# Patient Record
Sex: Male | Born: 1945
Health system: Southern US, Community
[De-identification: ages and names within clinical notes are randomized; demographics above are authoritative.]

## PROBLEM LIST (undated history)

## (undated) DIAGNOSIS — I2510 Atherosclerotic heart disease of native coronary artery without angina pectoris: Secondary | ICD-10-CM

## (undated) DIAGNOSIS — I10 Essential (primary) hypertension: Secondary | ICD-10-CM

## (undated) DIAGNOSIS — G629 Polyneuropathy, unspecified: Secondary | ICD-10-CM

## (undated) DIAGNOSIS — K219 Gastro-esophageal reflux disease without esophagitis: Secondary | ICD-10-CM

## (undated) DIAGNOSIS — N400 Enlarged prostate without lower urinary tract symptoms: Secondary | ICD-10-CM

## (undated) DIAGNOSIS — N183 Chronic kidney disease, stage 3 unspecified: Secondary | ICD-10-CM

## (undated) DIAGNOSIS — J449 Chronic obstructive pulmonary disease, unspecified: Secondary | ICD-10-CM

## (undated) DIAGNOSIS — R339 Retention of urine, unspecified: Secondary | ICD-10-CM

## (undated) DIAGNOSIS — R252 Cramp and spasm: Secondary | ICD-10-CM

## (undated) DIAGNOSIS — J439 Emphysema, unspecified: Secondary | ICD-10-CM

## (undated) DIAGNOSIS — Z720 Tobacco use: Secondary | ICD-10-CM

## (undated) DIAGNOSIS — R972 Elevated prostate specific antigen [PSA]: Secondary | ICD-10-CM

## (undated) DIAGNOSIS — E785 Hyperlipidemia, unspecified: Secondary | ICD-10-CM

## (undated) DIAGNOSIS — R7989 Other specified abnormal findings of blood chemistry: Secondary | ICD-10-CM

## (undated) DIAGNOSIS — E538 Deficiency of other specified B group vitamins: Secondary | ICD-10-CM

## (undated) DIAGNOSIS — R3915 Urgency of urination: Secondary | ICD-10-CM

## (undated) DIAGNOSIS — R351 Nocturia: Secondary | ICD-10-CM

## (undated) DIAGNOSIS — Z8639 Personal history of other endocrine, nutritional and metabolic disease: Secondary | ICD-10-CM

## (undated) HISTORY — DX: Emphysema, unspecified: J43.9

## (undated) HISTORY — DX: Hyperlipidemia, unspecified: E78.5

## (undated) HISTORY — DX: Deficiency of other specified B group vitamins: E53.8

## (undated) HISTORY — DX: Atherosclerotic heart disease of native coronary artery without angina pectoris: I25.10

## (undated) HISTORY — DX: Nocturia: R35.1

## (undated) HISTORY — DX: Other specified abnormal findings of blood chemistry: R79.89

## (undated) HISTORY — DX: Chronic kidney disease, stage 3 unspecified: N18.30

## (undated) HISTORY — DX: Essential (primary) hypertension: I10

## (undated) HISTORY — DX: Gastro-esophageal reflux disease without esophagitis: K21.9

## (undated) HISTORY — DX: Cramp and spasm: R25.2

## (undated) HISTORY — DX: Urgency of urination: R39.15

## (undated) HISTORY — DX: Chronic obstructive pulmonary disease, unspecified: J44.9

## (undated) HISTORY — DX: Chronic kidney disease, stage 3 (moderate): N18.3

## (undated) HISTORY — PX: OTHER SURGICAL HISTORY: SHX169

## (undated) HISTORY — DX: Elevated prostate specific antigen (PSA): R97.20

## (undated) HISTORY — DX: Personal history of other endocrine, nutritional and metabolic disease: Z86.39

## (undated) HISTORY — DX: Benign prostatic hyperplasia without lower urinary tract symptoms: N40.0

## (undated) HISTORY — DX: Retention of urine, unspecified: R33.9

## (undated) HISTORY — DX: Polyneuropathy, unspecified: G62.9

## (undated) HISTORY — DX: Tobacco use: Z72.0

---

## 2004-01-14 ENCOUNTER — Ambulatory Visit: Payer: Self-pay | Admitting: Internal Medicine

## 2007-07-15 ENCOUNTER — Ambulatory Visit: Payer: Self-pay | Admitting: Internal Medicine

## 2008-09-08 ENCOUNTER — Ambulatory Visit: Payer: Self-pay | Admitting: Internal Medicine

## 2008-10-02 ENCOUNTER — Ambulatory Visit: Payer: Self-pay | Admitting: General Surgery

## 2008-10-06 ENCOUNTER — Ambulatory Visit: Payer: Self-pay | Admitting: General Surgery

## 2008-10-08 ENCOUNTER — Ambulatory Visit: Payer: Self-pay | Admitting: Internal Medicine

## 2008-10-28 ENCOUNTER — Ambulatory Visit: Payer: Self-pay | Admitting: Family Medicine

## 2008-11-03 ENCOUNTER — Ambulatory Visit: Payer: Self-pay | Admitting: Internal Medicine

## 2008-11-08 ENCOUNTER — Ambulatory Visit: Payer: Self-pay | Admitting: Internal Medicine

## 2008-11-24 ENCOUNTER — Ambulatory Visit: Payer: Self-pay | Admitting: General Surgery

## 2008-12-09 ENCOUNTER — Ambulatory Visit: Payer: Self-pay | Admitting: Internal Medicine

## 2009-04-10 HISTORY — PX: COLONOSCOPY: SHX174

## 2009-04-23 LAB — HM COLONOSCOPY

## 2009-04-27 ENCOUNTER — Ambulatory Visit: Payer: Self-pay | Admitting: General Surgery

## 2012-06-10 DIAGNOSIS — E785 Hyperlipidemia, unspecified: Secondary | ICD-10-CM | POA: Diagnosis not present

## 2012-06-10 DIAGNOSIS — J449 Chronic obstructive pulmonary disease, unspecified: Secondary | ICD-10-CM | POA: Diagnosis not present

## 2012-06-10 DIAGNOSIS — F17200 Nicotine dependence, unspecified, uncomplicated: Secondary | ICD-10-CM | POA: Diagnosis not present

## 2012-06-10 DIAGNOSIS — E538 Deficiency of other specified B group vitamins: Secondary | ICD-10-CM | POA: Diagnosis not present

## 2012-10-14 DIAGNOSIS — I10 Essential (primary) hypertension: Secondary | ICD-10-CM | POA: Diagnosis not present

## 2012-10-14 DIAGNOSIS — J449 Chronic obstructive pulmonary disease, unspecified: Secondary | ICD-10-CM | POA: Diagnosis not present

## 2012-10-14 DIAGNOSIS — E785 Hyperlipidemia, unspecified: Secondary | ICD-10-CM | POA: Diagnosis not present

## 2012-10-14 DIAGNOSIS — K219 Gastro-esophageal reflux disease without esophagitis: Secondary | ICD-10-CM | POA: Diagnosis not present

## 2013-02-21 DIAGNOSIS — Z125 Encounter for screening for malignant neoplasm of prostate: Secondary | ICD-10-CM | POA: Diagnosis not present

## 2013-02-21 DIAGNOSIS — K219 Gastro-esophageal reflux disease without esophagitis: Secondary | ICD-10-CM | POA: Diagnosis not present

## 2013-02-21 DIAGNOSIS — Z23 Encounter for immunization: Secondary | ICD-10-CM | POA: Diagnosis not present

## 2013-02-21 DIAGNOSIS — G609 Hereditary and idiopathic neuropathy, unspecified: Secondary | ICD-10-CM | POA: Diagnosis not present

## 2013-02-21 DIAGNOSIS — E559 Vitamin D deficiency, unspecified: Secondary | ICD-10-CM | POA: Diagnosis not present

## 2013-02-21 DIAGNOSIS — E785 Hyperlipidemia, unspecified: Secondary | ICD-10-CM | POA: Diagnosis not present

## 2013-02-21 DIAGNOSIS — E538 Deficiency of other specified B group vitamins: Secondary | ICD-10-CM | POA: Diagnosis not present

## 2013-02-21 DIAGNOSIS — I10 Essential (primary) hypertension: Secondary | ICD-10-CM | POA: Diagnosis not present

## 2013-05-13 DIAGNOSIS — Z1331 Encounter for screening for depression: Secondary | ICD-10-CM | POA: Diagnosis not present

## 2013-05-13 DIAGNOSIS — Z125 Encounter for screening for malignant neoplasm of prostate: Secondary | ICD-10-CM | POA: Diagnosis not present

## 2013-05-13 DIAGNOSIS — Z0000 Encounter for general adult medical examination without abnormal findings: Secondary | ICD-10-CM | POA: Diagnosis not present

## 2013-06-23 DIAGNOSIS — E538 Deficiency of other specified B group vitamins: Secondary | ICD-10-CM | POA: Diagnosis not present

## 2013-06-23 DIAGNOSIS — K219 Gastro-esophageal reflux disease without esophagitis: Secondary | ICD-10-CM | POA: Diagnosis not present

## 2013-06-23 DIAGNOSIS — I10 Essential (primary) hypertension: Secondary | ICD-10-CM | POA: Diagnosis not present

## 2013-06-23 DIAGNOSIS — G609 Hereditary and idiopathic neuropathy, unspecified: Secondary | ICD-10-CM | POA: Diagnosis not present

## 2013-06-23 DIAGNOSIS — R972 Elevated prostate specific antigen [PSA]: Secondary | ICD-10-CM | POA: Diagnosis not present

## 2013-07-07 DIAGNOSIS — R351 Nocturia: Secondary | ICD-10-CM | POA: Diagnosis not present

## 2013-07-07 DIAGNOSIS — N400 Enlarged prostate without lower urinary tract symptoms: Secondary | ICD-10-CM | POA: Diagnosis not present

## 2013-07-28 DIAGNOSIS — N400 Enlarged prostate without lower urinary tract symptoms: Secondary | ICD-10-CM | POA: Diagnosis not present

## 2013-07-28 DIAGNOSIS — R972 Elevated prostate specific antigen [PSA]: Secondary | ICD-10-CM | POA: Diagnosis not present

## 2013-09-23 LAB — PSA: PSA: 2

## 2013-10-27 DIAGNOSIS — F17200 Nicotine dependence, unspecified, uncomplicated: Secondary | ICD-10-CM | POA: Diagnosis not present

## 2013-10-27 DIAGNOSIS — G63 Polyneuropathy in diseases classified elsewhere: Secondary | ICD-10-CM | POA: Diagnosis not present

## 2013-10-27 DIAGNOSIS — R7989 Other specified abnormal findings of blood chemistry: Secondary | ICD-10-CM | POA: Diagnosis not present

## 2013-10-27 DIAGNOSIS — E538 Deficiency of other specified B group vitamins: Secondary | ICD-10-CM | POA: Diagnosis not present

## 2013-10-27 DIAGNOSIS — E785 Hyperlipidemia, unspecified: Secondary | ICD-10-CM | POA: Diagnosis not present

## 2013-10-27 DIAGNOSIS — I10 Essential (primary) hypertension: Secondary | ICD-10-CM | POA: Diagnosis not present

## 2013-11-21 ENCOUNTER — Ambulatory Visit: Payer: Self-pay | Admitting: Family Medicine

## 2013-11-21 DIAGNOSIS — F17200 Nicotine dependence, unspecified, uncomplicated: Secondary | ICD-10-CM | POA: Diagnosis not present

## 2013-11-21 DIAGNOSIS — I2510 Atherosclerotic heart disease of native coronary artery without angina pectoris: Secondary | ICD-10-CM | POA: Diagnosis not present

## 2013-11-21 DIAGNOSIS — Z122 Encounter for screening for malignant neoplasm of respiratory organs: Secondary | ICD-10-CM | POA: Diagnosis not present

## 2014-02-02 DIAGNOSIS — R351 Nocturia: Secondary | ICD-10-CM | POA: Diagnosis not present

## 2014-02-02 DIAGNOSIS — R972 Elevated prostate specific antigen [PSA]: Secondary | ICD-10-CM | POA: Diagnosis not present

## 2014-02-02 DIAGNOSIS — R339 Retention of urine, unspecified: Secondary | ICD-10-CM | POA: Diagnosis not present

## 2014-02-02 DIAGNOSIS — R3915 Urgency of urination: Secondary | ICD-10-CM | POA: Diagnosis not present

## 2014-02-02 DIAGNOSIS — N400 Enlarged prostate without lower urinary tract symptoms: Secondary | ICD-10-CM | POA: Diagnosis not present

## 2014-02-02 DIAGNOSIS — Z720 Tobacco use: Secondary | ICD-10-CM | POA: Diagnosis not present

## 2014-03-02 DIAGNOSIS — Z23 Encounter for immunization: Secondary | ICD-10-CM | POA: Diagnosis not present

## 2014-03-02 DIAGNOSIS — I2510 Atherosclerotic heart disease of native coronary artery without angina pectoris: Secondary | ICD-10-CM | POA: Diagnosis not present

## 2014-03-02 DIAGNOSIS — E785 Hyperlipidemia, unspecified: Secondary | ICD-10-CM | POA: Diagnosis not present

## 2014-03-02 DIAGNOSIS — K219 Gastro-esophageal reflux disease without esophagitis: Secondary | ICD-10-CM | POA: Diagnosis not present

## 2014-03-02 DIAGNOSIS — J449 Chronic obstructive pulmonary disease, unspecified: Secondary | ICD-10-CM | POA: Diagnosis not present

## 2014-03-02 DIAGNOSIS — I10 Essential (primary) hypertension: Secondary | ICD-10-CM | POA: Diagnosis not present

## 2014-03-02 DIAGNOSIS — G63 Polyneuropathy in diseases classified elsewhere: Secondary | ICD-10-CM | POA: Diagnosis not present

## 2014-03-02 DIAGNOSIS — E538 Deficiency of other specified B group vitamins: Secondary | ICD-10-CM | POA: Diagnosis not present

## 2014-03-02 LAB — LIPID PANEL
CHOLESTEROL: 98 mg/dL (ref 0–200)
HDL: 51 mg/dL (ref 35–70)
LDL Cholesterol: 36 mg/dL
Triglycerides: 53 mg/dL (ref 40–160)

## 2014-06-30 DIAGNOSIS — Z125 Encounter for screening for malignant neoplasm of prostate: Secondary | ICD-10-CM | POA: Diagnosis not present

## 2014-06-30 DIAGNOSIS — G63 Polyneuropathy in diseases classified elsewhere: Secondary | ICD-10-CM | POA: Diagnosis not present

## 2014-06-30 DIAGNOSIS — F17210 Nicotine dependence, cigarettes, uncomplicated: Secondary | ICD-10-CM | POA: Diagnosis not present

## 2014-06-30 DIAGNOSIS — K219 Gastro-esophageal reflux disease without esophagitis: Secondary | ICD-10-CM | POA: Diagnosis not present

## 2014-06-30 DIAGNOSIS — J449 Chronic obstructive pulmonary disease, unspecified: Secondary | ICD-10-CM | POA: Diagnosis not present

## 2014-06-30 DIAGNOSIS — I10 Essential (primary) hypertension: Secondary | ICD-10-CM | POA: Diagnosis not present

## 2014-06-30 DIAGNOSIS — Z23 Encounter for immunization: Secondary | ICD-10-CM | POA: Diagnosis not present

## 2014-06-30 DIAGNOSIS — E785 Hyperlipidemia, unspecified: Secondary | ICD-10-CM | POA: Diagnosis not present

## 2014-06-30 DIAGNOSIS — I2510 Atherosclerotic heart disease of native coronary artery without angina pectoris: Secondary | ICD-10-CM | POA: Diagnosis not present

## 2014-06-30 DIAGNOSIS — E538 Deficiency of other specified B group vitamins: Secondary | ICD-10-CM | POA: Diagnosis not present

## 2014-06-30 DIAGNOSIS — N183 Chronic kidney disease, stage 3 (moderate): Secondary | ICD-10-CM | POA: Diagnosis not present

## 2014-10-04 ENCOUNTER — Encounter: Payer: Self-pay | Admitting: Family Medicine

## 2014-10-04 DIAGNOSIS — Z719 Counseling, unspecified: Secondary | ICD-10-CM | POA: Insufficient documentation

## 2014-10-04 DIAGNOSIS — N183 Chronic kidney disease, stage 3 unspecified: Secondary | ICD-10-CM | POA: Insufficient documentation

## 2014-10-04 DIAGNOSIS — K219 Gastro-esophageal reflux disease without esophagitis: Secondary | ICD-10-CM | POA: Insufficient documentation

## 2014-10-04 DIAGNOSIS — G63 Polyneuropathy in diseases classified elsewhere: Secondary | ICD-10-CM

## 2014-10-04 DIAGNOSIS — E559 Vitamin D deficiency, unspecified: Secondary | ICD-10-CM | POA: Insufficient documentation

## 2014-10-04 DIAGNOSIS — E785 Hyperlipidemia, unspecified: Secondary | ICD-10-CM | POA: Insufficient documentation

## 2014-10-04 DIAGNOSIS — N400 Enlarged prostate without lower urinary tract symptoms: Secondary | ICD-10-CM | POA: Insufficient documentation

## 2014-10-04 DIAGNOSIS — I10 Essential (primary) hypertension: Secondary | ICD-10-CM | POA: Insufficient documentation

## 2014-10-04 DIAGNOSIS — E538 Deficiency of other specified B group vitamins: Secondary | ICD-10-CM | POA: Insufficient documentation

## 2014-10-04 DIAGNOSIS — J449 Chronic obstructive pulmonary disease, unspecified: Secondary | ICD-10-CM | POA: Insufficient documentation

## 2014-10-04 DIAGNOSIS — I2510 Atherosclerotic heart disease of native coronary artery without angina pectoris: Secondary | ICD-10-CM | POA: Insufficient documentation

## 2014-10-05 ENCOUNTER — Encounter: Payer: Self-pay | Admitting: Family Medicine

## 2014-10-05 ENCOUNTER — Ambulatory Visit (INDEPENDENT_AMBULATORY_CARE_PROVIDER_SITE_OTHER): Payer: Medicare Other | Admitting: Family Medicine

## 2014-10-05 VITALS — BP 130/74 | HR 70 | Temp 97.4°F | Resp 16 | Ht 71.0 in | Wt 158.7 lb

## 2014-10-05 DIAGNOSIS — Z0000 Encounter for general adult medical examination without abnormal findings: Secondary | ICD-10-CM

## 2014-10-05 DIAGNOSIS — Z719 Counseling, unspecified: Secondary | ICD-10-CM

## 2014-10-05 DIAGNOSIS — Z720 Tobacco use: Secondary | ICD-10-CM | POA: Insufficient documentation

## 2014-10-05 DIAGNOSIS — N400 Enlarged prostate without lower urinary tract symptoms: Secondary | ICD-10-CM

## 2014-10-05 NOTE — Progress Notes (Signed)
Name: Kevin Roberts   MRN: [id number]    DOB: September 19, 1945   Date:10/05/2014       Progress Note  Subjective  Chief Complaint  Chief Complaint  Patient presents with  . Annual Exam    HPI  Male Exam: he is feeling well, no complaints.  He is willing to try quitting smoking and will try resuming nicotine patch, since it has worked well for him in the past.  PSA is up to date, only has nocturia once per night - usually early in am. Sees Urologist once yearly for BPH.   Neuropathy: he is taking gabapentin twice daily only but seems to be controlling his symptoms. He forgets to take it at noon.   Cyst on suprapubic area: he noticed a bump on suprapubic area about three months ago, tender to touch and growing slowly.    Patient Active Problem List   Diagnosis Date Noted  . Tobacco use   . Smokes one pack a day or less   . B12 neuropathy 10/04/2014  . CAD, multiple vessel 10/04/2014  . Benign essential HTN 10/04/2014  . Chronic kidney disease (CKD), stage III (moderate) 10/04/2014  . Cigarette smoker 10/04/2014  . Dyslipidemia 10/04/2014  . COPD, mild 10/04/2014  . Gastro-esophageal reflux disease without esophagitis 10/04/2014  . Enlarged prostate 10/04/2014  . Vitamin D deficiency 10/04/2014    History reviewed. No pertinent past surgical history.  Family History  Problem Relation Age of Onset  . Hypertension Mother   . Cancer Father     Lung  . Mental illness Brother     1-Schizophrenia  . GER disease Son     History   Social History  . Marital Status: Married    Spouse Name: N/A  . Number of Children: N/A  . Years of Education: N/A   Occupational History  . Not on file.   Social History Main Topics  . Smoking status: Current Every Day Smoker -- 1.00 packs/day for 41 years    Types: Cigarettes    Start date: 10/04/1973  . Smokeless tobacco: Never Used  . Alcohol Use: No  . Drug Use: No  . Sexual Activity: No   Other Topics Concern  . Not on file    Social History Narrative  . No narrative on file     Current outpatient prescriptions:  .  aspirin 81 MG tablet, Take 1 tablet by mouth daily., Disp: , Rfl:  .  atenolol (TENORMIN) 25 MG tablet, Take 1 tablet by mouth daily., Disp: , Rfl:  .  atorvastatin (LIPITOR) 40 MG tablet, Take 1 tablet by mouth daily., Disp: , Rfl:  .  Cyanocobalamin (B-12) 1000 MCG TBCR, Take 1 tablet by mouth daily., Disp: , Rfl:  .  gabapentin (NEURONTIN) 300 MG capsule, Take 1 capsule by mouth daily., Disp: , Rfl:  .  lansoprazole (PREVACID) 30 MG capsule, Take 1 capsule by mouth daily., Disp: , Rfl:  .  MULTIPLE VITAMIN PO, Take 1 tablet by mouth daily., Disp: , Rfl:  .  potassium chloride SA (KLOR-CON M20) 20 MEQ tablet, Take 1 tablet by mouth daily., Disp: , Rfl:  .  SPIRIVA HANDIHALER 18 MCG inhalation capsule, Place 1 puff into inhaler and inhale daily., Disp: , Rfl:  .  tamsulosin (FLOMAX) 0.4 MG CAPS capsule, Take 1 capsule by mouth daily., Disp: , Rfl:  .  triamterene-hydrochlorothiazide (MAXZIDE-25) 37.5-25 MG per tablet, Take 1 tablet by mouth daily., Disp: , Rfl:  .  Vitamin  D, Cholecalciferol, 1000 UNITS CAPS, Take 1 tablet by mouth daily., Disp: , Rfl:   No Known Allergies   ROS  Constitutional: Negative for fever or weight change.  Respiratory: Negative for cough and shortness of breath.   Cardiovascular: Negative for chest pain or palpitations.  Gastrointestinal: Negative for abdominal pain, no bowel changes.  Musculoskeletal: Negative for gait problem or joint swelling.  Skin: Negative for rash.  Neurological: Negative for dizziness or headache.  No other specific complaints in a complete review of systems (except as listed in HPI above).   Objective  Filed Vitals:   10/05/14 0837  BP: 130/74  Pulse: 70  Temp: 97.4 F (36.3 C)  TempSrc: Oral  Resp: 16  Height: 5\' 11"  (1.803 m)  Weight: 158 lb 11.2 oz (71.986 kg)  SpO2: 93%    Body mass index is 22.14  kg/(m^2).  Physical Exam  Constitutional: Patient appears well-developed and well-nourished. No distress.  HENT: Head: Normocephalic and atraumatic.  Nose: Nose normal. Mouth/Throat: Oropharynx is clear and moist. No oropharyngeal exudate.  Eyes: Conjunctivae and EOM are normal. Pupils are equal, round, and reactive to light. No scleral icterus. Cerumen impaction on both ears Neck: Normal range of motion. Neck supple. No JVD present. No thyromegaly present.  Cardiovascular: Normal rate, regular rhythm and normal heart sounds.  No murmur heard. No BLE edema. Pulmonary/Chest: Effort normal and breath sounds normal. No respiratory distress. Abdominal: Soft. Bowel sounds are normal, no distension. There is no tenderness. no masses MALE GENITALIA: Normal descended testes bilaterally, no masses palpated, no hernias, no lesions, no discharge RECTAL: Prostate mildly enlarged and consistency, no rectal masses or hemorrhoids Musculoskeletal: Normal range of motion, no joint effusions. No gross deformities Neurological: he is alert and oriented to person, place, and time. No cranial nerve deficit. Coordination, balance, strength, speech and gait are normal.  Skin: Skin is warm and dry. No rash noted. No erythema. He has a tender cyst on suprapubic area Psychiatric: Patient has a normal mood and affect. behavior is normal. Judgment and thought content normal.    PHQ2/9: Depression screen PHQ 2/9 10/05/2014  Decreased Interest 0  Down, Depressed, Hopeless 0  PHQ - 2 Score 0     Fall Risk: Fall Risk  10/05/2014  Falls in the past year? No    Assessment & Plan  1. Encounter for routine history and physical exam for male  - US Aorta; Future  2. Encounter for counseling Discussed importance of 150 minutes of physical activity weekly, eat two servings of fish weekly, eat one serving of tree nuts ( cashews, pistachios, pecans, almonds.Marland Kitchen) every other day, eat 6 servings of fruit/vegetables daily  and drink plenty of water and avoid sweet beverages. Continue baby aspirin daily and quit smoking  3. Enlarged prostate PSA within normal limits continue follow up with Urologist

## 2014-10-05 NOTE — Patient Instructions (Signed)
  Mr. Melander , Thank you for taking time to come for your Medicare Wellness Visit. I appreciate your ongoing commitment to your health goals. Please review the following plan we discussed and let me know if I can assist you in the future.   These are the goals we discussed: He needs to quit smoking, he has tried otc Nicotine gym, he has tried nicotine patch and did well, he will try that again otc.   Discussed importance of 150 minutes of physical activity weekly, eat two servings of fish weekly, eat one serving of tree nuts ( cashews, pistachios, pecans, almonds.Marland Kitchen) every other day, eat 6 servings of fruit/vegetables daily and drink plenty of water and avoid sweet beverages.    This is a list of the screening recommended for you and due dates:  Health Maintenance  Topic Date Due  . Flu Shot  11/09/2014  . Pneumonia vaccines (2 of 2 - PPSV23) 06/30/2015  . Tetanus Vaccine  12/08/2018  . Colon Cancer Screening  04/24/2019  . Shingles Vaccine  Completed

## 2014-10-07 ENCOUNTER — Telehealth: Payer: Self-pay | Admitting: Radiology

## 2014-10-07 ENCOUNTER — Ambulatory Visit
Admission: RE | Admit: 2014-10-07 | Discharge: 2014-10-07 | Disposition: A | Discharge status: Home / Self Care | Payer: Medicare Other | Source: Ambulatory Visit | Attending: Family Medicine | Admitting: Family Medicine

## 2014-10-07 ENCOUNTER — Other Ambulatory Visit: Payer: Self-pay | Admitting: Family Medicine

## 2014-10-07 DIAGNOSIS — Z8249 Family history of ischemic heart disease and other diseases of the circulatory system: Secondary | ICD-10-CM

## 2014-10-07 DIAGNOSIS — R109 Unspecified abdominal pain: Secondary | ICD-10-CM

## 2014-10-07 DIAGNOSIS — Z720 Tobacco use: Secondary | ICD-10-CM

## 2014-10-07 DIAGNOSIS — F17200 Nicotine dependence, unspecified, uncomplicated: Secondary | ICD-10-CM

## 2014-10-07 DIAGNOSIS — Z0000 Encounter for general adult medical examination without abnormal findings: Secondary | ICD-10-CM

## 2014-10-07 DIAGNOSIS — R1900 Intra-abdominal and pelvic swelling, mass and lump, unspecified site: Secondary | ICD-10-CM

## 2014-10-07 DIAGNOSIS — R0989 Other specified symptoms and signs involving the circulatory and respiratory systems: Secondary | ICD-10-CM

## 2014-10-13 ENCOUNTER — Ambulatory Visit
Admission: RE | Admit: 2014-10-13 | Discharge: 2014-10-13 | Disposition: A | Discharge status: Home / Self Care | Payer: Medicare Other | Source: Ambulatory Visit | Attending: Family Medicine | Admitting: Family Medicine

## 2014-10-13 DIAGNOSIS — N281 Cyst of kidney, acquired: Secondary | ICD-10-CM | POA: Insufficient documentation

## 2014-10-13 DIAGNOSIS — F17200 Nicotine dependence, unspecified, uncomplicated: Secondary | ICD-10-CM | POA: Insufficient documentation

## 2014-10-13 DIAGNOSIS — I700 Atherosclerosis of aorta: Secondary | ICD-10-CM | POA: Insufficient documentation

## 2014-10-13 DIAGNOSIS — R109 Unspecified abdominal pain: Secondary | ICD-10-CM

## 2014-10-13 DIAGNOSIS — Z8249 Family history of ischemic heart disease and other diseases of the circulatory system: Secondary | ICD-10-CM | POA: Insufficient documentation

## 2014-10-13 DIAGNOSIS — Z0000 Encounter for general adult medical examination without abnormal findings: Secondary | ICD-10-CM | POA: Insufficient documentation

## 2014-10-13 DIAGNOSIS — Z720 Tobacco use: Secondary | ICD-10-CM | POA: Diagnosis not present

## 2014-10-13 DIAGNOSIS — R0989 Other specified symptoms and signs involving the circulatory and respiratory systems: Secondary | ICD-10-CM

## 2014-10-14 ENCOUNTER — Other Ambulatory Visit: Payer: Medicare Other

## 2014-10-21 ENCOUNTER — Ambulatory Visit (INDEPENDENT_AMBULATORY_CARE_PROVIDER_SITE_OTHER): Payer: Medicare Other | Admitting: Family Medicine

## 2014-10-21 ENCOUNTER — Encounter: Payer: Self-pay | Admitting: Family Medicine

## 2014-10-21 VITALS — BP 128/76 | HR 72 | Temp 98.2°F | Resp 18 | Ht 71.0 in | Wt 157.4 lb

## 2014-10-21 DIAGNOSIS — L910 Hypertrophic scar: Secondary | ICD-10-CM

## 2014-10-21 DIAGNOSIS — L298 Other pruritus: Secondary | ICD-10-CM | POA: Diagnosis not present

## 2014-10-21 DIAGNOSIS — H9203 Otalgia, bilateral: Secondary | ICD-10-CM | POA: Diagnosis not present

## 2014-10-21 DIAGNOSIS — H6123 Impacted cerumen, bilateral: Secondary | ICD-10-CM

## 2014-10-21 DIAGNOSIS — L989 Disorder of the skin and subcutaneous tissue, unspecified: Secondary | ICD-10-CM | POA: Diagnosis not present

## 2014-10-21 MED ORDER — LIDOCAINE HCL (PF) 1 % IJ SOLN
2.0000 mL | Freq: Once | INTRAMUSCULAR | Status: AC
  Administered 2014-10-21: 2 mL via INTRADERMAL

## 2014-10-21 NOTE — Progress Notes (Signed)
Name: Kevin Roberts   MRN: [id number]    DOB: 1945-05-09   Date:10/21/2014       Progress Note  Subjective  Chief Complaint  Chief Complaint  Patient presents with  . Cyst    onset-4 months, has increased in size, wants removal, itchy near scrotum  . Ear Fullness    bilateral, left ear is worst, trouble hearing feels like it is full of wax    HPI  Skin lesion: on supra pubic area, going on for months and increasing in size, bothering him  Ear Fullness/pain and mild hearing loss, cerumen impaction on exam, symptoms improved after procedure  Patient Active Problem List   Diagnosis Date Noted  . Renal cyst, left 10/13/2014  . Tobacco use   . Smokes one pack a day or less   . B12 neuropathy 10/04/2014  . CAD, multiple vessel 10/04/2014  . Benign essential HTN 10/04/2014  . Chronic kidney disease (CKD), stage III (moderate) 10/04/2014  . Cigarette smoker 10/04/2014  . Dyslipidemia 10/04/2014  . COPD, mild 10/04/2014  . Gastro-esophageal reflux disease without esophagitis 10/04/2014  . Enlarged prostate 10/04/2014  . Vitamin D deficiency 10/04/2014    History  Substance Use Topics  . Smoking status: Current Every Day Smoker -- 1.00 packs/day for 41 years    Types: Cigarettes    Start date: 10/04/1973  . Smokeless tobacco: Never Used  . Alcohol Use: No     Current outpatient prescriptions:  .  aspirin 81 MG tablet, Take 1 tablet by mouth daily., Disp: , Rfl:  .  atenolol (TENORMIN) 25 MG tablet, Take 1 tablet by mouth daily., Disp: , Rfl:  .  atorvastatin (LIPITOR) 40 MG tablet, Take 1 tablet by mouth daily., Disp: , Rfl:  .  Cyanocobalamin (B-12) 1000 MCG TBCR, Take 1 tablet by mouth daily., Disp: , Rfl:  .  gabapentin (NEURONTIN) 300 MG capsule, Take 1 capsule by mouth daily., Disp: , Rfl:  .  lansoprazole (PREVACID) 30 MG capsule, Take 1 capsule by mouth daily., Disp: , Rfl:  .  MULTIPLE VITAMIN PO, Take 1 tablet by mouth daily., Disp: , Rfl:  .  potassium chloride  SA (KLOR-CON M20) 20 MEQ tablet, Take 1 tablet by mouth daily., Disp: , Rfl:  .  SPIRIVA HANDIHALER 18 MCG inhalation capsule, Place 1 puff into inhaler and inhale daily., Disp: , Rfl:  .  tamsulosin (FLOMAX) 0.4 MG CAPS capsule, Take 1 capsule by mouth daily., Disp: , Rfl:  .  triamterene-hydrochlorothiazide (MAXZIDE-25) 37.5-25 MG per tablet, Take 1 tablet by mouth daily., Disp: , Rfl:  .  Vitamin D, Cholecalciferol, 1000 UNITS CAPS, Take 1 tablet by mouth daily., Disp: , Rfl:   No Known Allergies  ROS  Ten systems reviewed and is negative except as mentioned in HPI   Objective  Filed Vitals:   10/21/14 1301  BP: 128/76  Pulse: 72  Temp: 98.2 F (36.8 C)  Resp: 18     Physical Exam Constitutional: Patient appears well-developed and well-nourished. No distress.  Eyes:  No scleral icterus.  Neck: Normal range of motion. Neck supple. Cardiovascular: Normal rate, regular rhythm and normal heart sounds.  No murmur heard. No BLE edema. Pulmonary/Chest: Effort normal and breath sounds normal. No respiratory distress. Abdominal: Soft.  There is no tenderness. Psychiatric: Patient has a normal mood and affect. behavior is normal. Judgment and thought content normal. Cerumen impaction bilaterally, removed with Ear lavage Skin lesion, 8 mm in size on supra pubic  area, non tender, thick and hyperpigmented skin   Assessment & Plan  1. Cerumen impaction, bilateral  - Ear Lavage Lavage done with peroxide and warm water, verbal consent given by patient. He tolerated procedure well, no complications, ear fullness resolved after procedure 2. Ear pain, bilateral Resolved with ear lavage - Ear Lavage  3. Skin lesion . Consent signed: YES  Procedure: Skin Mass Removal Location: supra pubic area Equipment used: derma-blade, high temperature cautery, sterile scalpel, tweezers, curved scissors Anesthesia: 1% Lidocaine w/o Epinephrine  Cleaned and prepped: Betadine  After consent  signed, are of skin prepped with betadine. Lidocaine w/o epinephrine injected into skin underneath skin mass. After properly numbed sterile equipment used to remove tag.  Specimen sent for pathology analysis. Instructed on proper care to allow for proper healing. F/U for nursing visit if needed. - PR DESTRUC BENIGN/PREMAL,FIRST LESION

## 2014-11-12 ENCOUNTER — Ambulatory Visit: Payer: Self-pay | Admitting: Family Medicine

## 2014-11-25 ENCOUNTER — Ambulatory Visit (INDEPENDENT_AMBULATORY_CARE_PROVIDER_SITE_OTHER): Payer: Medicare Other | Admitting: Family Medicine

## 2014-11-25 ENCOUNTER — Encounter: Payer: Self-pay | Admitting: Family Medicine

## 2014-11-25 VITALS — BP 130/70 | HR 108 | Temp 98.8°F | Resp 18 | Ht 71.0 in | Wt 159.3 lb

## 2014-11-25 DIAGNOSIS — Z23 Encounter for immunization: Secondary | ICD-10-CM

## 2014-11-25 DIAGNOSIS — J449 Chronic obstructive pulmonary disease, unspecified: Secondary | ICD-10-CM

## 2014-11-25 DIAGNOSIS — Z720 Tobacco use: Secondary | ICD-10-CM

## 2014-11-25 DIAGNOSIS — E785 Hyperlipidemia, unspecified: Secondary | ICD-10-CM

## 2014-11-25 DIAGNOSIS — E538 Deficiency of other specified B group vitamins: Secondary | ICD-10-CM | POA: Diagnosis not present

## 2014-11-25 DIAGNOSIS — I2510 Atherosclerotic heart disease of native coronary artery without angina pectoris: Secondary | ICD-10-CM

## 2014-11-25 DIAGNOSIS — N183 Chronic kidney disease, stage 3 unspecified: Secondary | ICD-10-CM

## 2014-11-25 DIAGNOSIS — G63 Polyneuropathy in diseases classified elsewhere: Secondary | ICD-10-CM

## 2014-11-25 DIAGNOSIS — I10 Essential (primary) hypertension: Secondary | ICD-10-CM

## 2014-11-25 DIAGNOSIS — K219 Gastro-esophageal reflux disease without esophagitis: Secondary | ICD-10-CM | POA: Diagnosis not present

## 2014-11-25 DIAGNOSIS — E559 Vitamin D deficiency, unspecified: Secondary | ICD-10-CM | POA: Diagnosis not present

## 2014-11-25 MED ORDER — GABAPENTIN 300 MG PO CAPS: 300.0000 mg | ORAL_CAPSULE | Freq: Three times a day (TID) | ORAL | Status: DC

## 2014-11-25 MED ORDER — ATENOLOL 25 MG PO TABS: 25.0000 mg | ORAL_TABLET | Freq: Every day | ORAL | Status: DC

## 2014-11-25 MED ORDER — LANSOPRAZOLE 30 MG PO CPDR: 30.0000 mg | DELAYED_RELEASE_CAPSULE | Freq: Every day | ORAL | Status: DC

## 2014-11-25 MED ORDER — POTASSIUM CHLORIDE CRYS ER 20 MEQ PO TBCR: 20.0000 meq | EXTENDED_RELEASE_TABLET | Freq: Every day | ORAL | Status: DC

## 2014-11-25 MED ORDER — ATORVASTATIN CALCIUM 40 MG PO TABS: 40.0000 mg | ORAL_TABLET | Freq: Every day | ORAL | Status: DC

## 2014-11-25 NOTE — Progress Notes (Signed)
Name: Kevin Roberts   MRN: [id number]    DOB: 1945-10-02   Date:11/25/2014       Progress Note  Subjective  Chief Complaint  Chief Complaint  Patient presents with  . COPD    Unchanged, Dry Cough, Itchy Throat  . Hypertension    No problems  . Gastrophageal Reflux    Unchanged, well controlled with medication  . Hyperlipidemia    Leg Cramps, Hand Cramps Occasionally  . Medication Refill    4 month F/U    HPI  COPD Mild: he is taking Spiriva, no change in sputum production or SOB even though he has a cold since yesterday with clear rhinorrhea, nasal congestion, no fever.    HTN: taking medication, denies side effects and bp is at goal  GERD: symptoms are under control as long as he takes Lanzoprazole, he states when he skips medications for one day he develops heartburn and epigastric pain and has regurgitation.  Hyperlipidemia: taking Atorvastatin and denies side effects. Taking aspirin daily   CAD: he denies chest pain , but he has decrease in exercise tolerance, it may be multifactorial from COPD also. He has never seen a cardiologist - at this point he only wants medical management  B12 neuropathy: he is taking B12 supplementation and also takes Neurontin  - symptoms are stable  Tobacco Abuse: he has been a smoker for 41 years, he is up to date with triple A screening, negative in 2016, chest CT done in 11/2013 and is due for repeat.  Explained importance of quitting smoking   Patient Active Problem List   Diagnosis Date Noted  . Renal cyst, left 10/13/2014  . Tobacco use   . B12 neuropathy 10/04/2014  . CAD, multiple vessel 10/04/2014  . Benign essential HTN 10/04/2014  . Chronic kidney disease (CKD), stage III (moderate) 10/04/2014  . Dyslipidemia 10/04/2014  . COPD, mild 10/04/2014  . Gastro-esophageal reflux disease without esophagitis 10/04/2014  . Enlarged prostate 10/04/2014  . Vitamin D deficiency 10/04/2014    History reviewed. No pertinent past surgical  history.  Family History  Problem Relation Age of Onset  . Hypertension Mother   . Cancer Father     Lung  . Mental illness Brother     1-Schizophrenia  . GER disease Son     Social History   Social History  . Marital Status: Married    Spouse Name: N/A  . Number of Children: N/A  . Years of Education: N/A   Occupational History  . Not on file.   Social History Main Topics  . Smoking status: Current Every Day Smoker -- 1.00 packs/day for 41 years    Types: Cigarettes    Start date: 10/04/1973  . Smokeless tobacco: Never Used  . Alcohol Use: No  . Drug Use: No  . Sexual Activity: No   Other Topics Concern  . Not on file   Social History Narrative     Current outpatient prescriptions:  .  aspirin 81 MG tablet, Take 1 tablet by mouth daily., Disp: , Rfl:  .  atenolol (TENORMIN) 25 MG tablet, Take 1 tablet (25 mg total) by mouth daily., Disp: 90 tablet, Rfl: 1 .  atorvastatin (LIPITOR) 40 MG tablet, Take 1 tablet (40 mg total) by mouth daily., Disp: 90 tablet, Rfl: 1 .  Cyanocobalamin (B-12) 1000 MCG TBCR, Take 1 tablet by mouth daily., Disp: , Rfl:  .  gabapentin (NEURONTIN) 300 MG capsule, Take 1 capsule (300 mg total)  by mouth 3 (three) times daily., Disp: 270 capsule, Rfl: 1 .  lansoprazole (PREVACID) 30 MG capsule, Take 1 capsule (30 mg total) by mouth daily., Disp: 90 capsule, Rfl: 1 .  MULTIPLE VITAMIN PO, Take 1 tablet by mouth daily., Disp: , Rfl:  .  potassium chloride SA (KLOR-CON M20) 20 MEQ tablet, Take 1 tablet (20 mEq total) by mouth daily., Disp: 90 tablet, Rfl: 1 .  SPIRIVA HANDIHALER 18 MCG inhalation capsule, Place 1 puff into inhaler and inhale daily., Disp: , Rfl:  .  tamsulosin (FLOMAX) 0.4 MG CAPS capsule, Take 1 capsule by mouth daily., Disp: , Rfl:  .  triamterene-hydrochlorothiazide (MAXZIDE-25) 37.5-25 MG per tablet, Take 1 tablet by mouth daily., Disp: , Rfl:  .  Vitamin D, Cholecalciferol, 1000 UNITS CAPS, Take 1 tablet by mouth daily.,  Disp: , Rfl:   No Known Allergies   ROS  Constitutional: Negative for fever or weight change.  Respiratory: Positive  for cough mild  shortness of breath with activity .   Cardiovascular: Negative for chest pain or palpitations.  Gastrointestinal: Negative for abdominal pain, no bowel changes.  Musculoskeletal: Negative for gait problem or joint swelling.  Skin: Negative for rash.  Neurological: Negative for dizziness or headache.  No other specific complaints in a complete review of systems (except as listed in HPI above).  Objective  Filed Vitals:   11/25/14 0942  BP: 130/70  Pulse: 108  Temp: 98.8 F (37.1 C)  TempSrc: Oral  Resp: 18  Height: 5\' 11"  (1.803 m)  Weight: 159 lb 4.8 oz (72.258 kg)  SpO2: 95%    Body mass index is 22.23 kg/(m^2).  Physical Exam Constitutional: Patient appears well-developed and well-nourished, thin No distress.  HEENT: head atraumatic, normocephalic, pupils equal and reactive to light, neck supple, throat within normal limits Cardiovascular: Normal rate, regular rhythm and normal heart sounds.  No murmur heard. No BLE edema. Pulmonary/Chest: Effort normal and breath sounds normal. No respiratory distress. Abdominal: Soft.  There is no tenderness. Psychiatric: Patient has a normal mood and affect. behavior is normal. Judgment and thought content normal.   PHQ2/9: Depression screen PHQ 2/9 10/05/2014  Decreased Interest 0  Down, Depressed, Hopeless 0  PHQ - 2 Score 0    Fall Risk: Fall Risk  10/05/2014  Falls in the past year? No     Assessment & Plan  1. COPD, mild Continue medication, advised to quit smoking  2. Tobacco use  - CT CHEST LUNG CA SCREEN LOW DOSE W/O CM; Future  3. Dyslipidemia  - Lipid panel - atorvastatin (LIPITOR) 40 MG tablet; Take 1 tablet (40 mg total) by mouth daily.  Dispense: 90 tablet; Refill: 1  4. Gastro-esophageal reflux disease without esophagitis  - lansoprazole (PREVACID) 30 MG capsule;  Take 1 capsule (30 mg total) by mouth daily.  Dispense: 90 capsule; Refill: 1  5. CAD, multiple vessel Continue medication management  6. Benign essential HTN At goal  - Comprehensive metabolic panel - potassium chloride SA (KLOR-CON M20) 20 MEQ tablet; Take 1 tablet (20 mEq total) by mouth daily.  Dispense: 90 tablet; Refill: 1 - atenolol (TENORMIN) 25 MG tablet; Take 1 tablet (25 mg total) by mouth daily.  Dispense: 90 tablet; Refill: 1  7. Chronic kidney disease (CKD), stage III (moderate) Recheck labs  8. B12 neuropathy  - Vitamin B12 - gabapentin (NEURONTIN) 300 MG capsule; Take 1 capsule (300 mg total) by mouth 3 (three) times daily.  Dispense: 270 capsule; Refill: 1  9. Vitamin D deficiency  - Vit D  25 hydroxy (rtn osteoporosis monitoring)  10. Needs flu shot  - Flu Vaccine QUAD 36+ mos IM

## 2014-12-03 DIAGNOSIS — E559 Vitamin D deficiency, unspecified: Secondary | ICD-10-CM | POA: Diagnosis not present

## 2014-12-03 DIAGNOSIS — E538 Deficiency of other specified B group vitamins: Secondary | ICD-10-CM | POA: Diagnosis not present

## 2014-12-03 DIAGNOSIS — E785 Hyperlipidemia, unspecified: Secondary | ICD-10-CM | POA: Diagnosis not present

## 2014-12-03 DIAGNOSIS — I10 Essential (primary) hypertension: Secondary | ICD-10-CM | POA: Diagnosis not present

## 2014-12-04 LAB — VITAMIN B12: Vitamin B-12: 1239 pg/mL — ABNORMAL HIGH (ref 211–946)

## 2014-12-04 LAB — COMPREHENSIVE METABOLIC PANEL
A/G RATIO: 1.6 (ref 1.1–2.5)
ALT: 21 IU/L (ref 0–44)
AST: 34 IU/L (ref 0–40)
Albumin: 4.4 g/dL (ref 3.6–4.8)
Alkaline Phosphatase: 65 IU/L (ref 39–117)
BILIRUBIN TOTAL: 1.2 mg/dL (ref 0.0–1.2)
BUN/Creatinine Ratio: 9 — ABNORMAL LOW (ref 10–22)
BUN: 12 mg/dL (ref 8–27)
CHLORIDE: 98 mmol/L (ref 97–108)
CO2: 25 mmol/L (ref 18–29)
Calcium: 10.4 mg/dL — ABNORMAL HIGH (ref 8.6–10.2)
Creatinine, Ser: 1.31 mg/dL — ABNORMAL HIGH (ref 0.76–1.27)
GFR calc Af Amer: 65 mL/min/{1.73_m2} (ref >59)
GFR calc non Af Amer: 56 mL/min/{1.73_m2} — ABNORMAL LOW (ref >59)
Globulin, Total: 2.8 g/dL (ref 1.5–4.5)
Glucose: 75 mg/dL (ref 65–99)
POTASSIUM: 4.4 mmol/L (ref 3.5–5.2)
Sodium: 139 mmol/L (ref 134–144)
TOTAL PROTEIN: 7.2 g/dL (ref 6.0–8.5)

## 2014-12-04 LAB — VITAMIN D 25 HYDROXY (VIT D DEFICIENCY, FRACTURES): Vit D, 25-Hydroxy: 57.3 ng/mL (ref 30.0–100.0)

## 2014-12-04 LAB — LIPID PANEL
CHOLESTEROL TOTAL: 92 mg/dL — AB (ref 100–199)
Chol/HDL Ratio: 1.9 ratio units (ref 0.0–5.0)
HDL: 49 mg/dL (ref >39)
LDL Calculated: 33 mg/dL (ref 0–99)
TRIGLYCERIDES: 48 mg/dL (ref 0–149)
VLDL CHOLESTEROL CAL: 10 mg/dL (ref 5–40)

## 2014-12-08 NOTE — Progress Notes (Signed)
Patient notified

## 2015-01-21 ENCOUNTER — Encounter: Payer: Self-pay | Admitting: *Deleted

## 2015-02-03 ENCOUNTER — Encounter: Payer: Self-pay | Admitting: Urology

## 2015-02-03 ENCOUNTER — Ambulatory Visit (INDEPENDENT_AMBULATORY_CARE_PROVIDER_SITE_OTHER): Payer: Medicare Other | Admitting: Urology

## 2015-02-03 VITALS — BP 120/56 | HR 47 | Ht 71.0 in | Wt 154.8 lb

## 2015-02-03 DIAGNOSIS — N400 Enlarged prostate without lower urinary tract symptoms: Secondary | ICD-10-CM | POA: Diagnosis not present

## 2015-02-03 DIAGNOSIS — N401 Enlarged prostate with lower urinary tract symptoms: Secondary | ICD-10-CM

## 2015-02-03 DIAGNOSIS — N138 Other obstructive and reflux uropathy: Secondary | ICD-10-CM

## 2015-02-03 DIAGNOSIS — I2510 Atherosclerotic heart disease of native coronary artery without angina pectoris: Secondary | ICD-10-CM | POA: Diagnosis not present

## 2015-02-03 NOTE — Progress Notes (Signed)
02/03/2015 1:45 PM   Kevin Roberts [birth year] [id number]  Referring provider: Steele Sizer, MD [address] [place], [place] [address]  Chief Complaint  Patient presents with  . Benign Prostatic Hypertrophy    one year recheck    HPI: Patient is a 69 year old African-American male who presents today for his yearly office visit for BPH with LUTS managed with tamsulosin.  BPH WITH LUTS His IPSS score today is 6, which is mild lower urinary tract symptomatology. He is mostly satisfied with his quality life due to his urinary symptoms.  His previous IPSS score was 3/2.  His previous PVR is 27 mL.   He denies any dysuria, hematuria or suprapubic pain.  He currently taking tamsulosin 0.4 mg daily.  He also denies any recent fevers, chills, nausea or vomiting.   He does not have a family history of PCa.      IPSS      02/03/15 1000       International Prostate Symptom Score   How often have you had the sensation of not emptying your bladder? Less than half the time     How often have you had to urinate less than every two hours? Not at All     How often have you found you stopped and started again several times when you urinated? Not at All     How often have you found it difficult to postpone urination? About half the time     How often have you had a weak urinary stream? Not at All     How often have you had to strain to start urination? Not at All     How many times did you typically get up at night to urinate? 1 Time     Total IPSS Score 6     Quality of Life due to urinary symptoms   If you were to spend the rest of your life with your urinary condition just the way it is now how would you feel about that? Mostly Satisfied        Score:  1-7 Mild 8-19 Moderate 20-35 Severe     PMH: Past Medical History  Diagnosis Date  . GERD (gastroesophageal reflux disease)   . Hypertension   . Hyperlipidemia   . COPD (chronic obstructive pulmonary disease)  ([place])   . CAD (coronary atherosclerotic disease)   . Chronic kidney disease, stage III (moderate)   . History of hypercalcemia   . Elevated ferritin   . Benign prostatic hypertrophy   . Peripheral polyneuropathy ([place])   . Tobacco use   . Vitamin B 12 deficiency   . Cramps, muscle, general   . Nocturia   . Urgency of micturation   . Incomplete bladder emptying   . Rising PSA level     Surgical History: Past Surgical History  Procedure Laterality Date  . Throat biopsy      lump removed    Home Medications:    Medication List       This list is accurate as of: 02/03/15  1:45 PM.  Always use your most recent med list.               aspirin 81 MG tablet  Take 1 tablet by mouth daily.     atenolol 25 MG tablet  Commonly known as:  TENORMIN  Take 1 tablet (25 mg total) by mouth daily.     atorvastatin 40 MG tablet  Commonly known  as:  LIPITOR  Take 1 tablet (40 mg total) by mouth daily.     B-12 1000 MCG Tbcr  Take 1 tablet by mouth daily.     gabapentin 300 MG capsule  Commonly known as:  NEURONTIN  Take 1 capsule (300 mg total) by mouth 3 (three) times daily.     lansoprazole 30 MG capsule  Commonly known as:  PREVACID  Take 1 capsule (30 mg total) by mouth daily.     MULTIPLE VITAMIN PO  Take 1 tablet by mouth daily.     potassium chloride SA 20 MEQ tablet  Commonly known as:  KLOR-CON M20  Take 1 tablet (20 mEq total) by mouth daily.     SPIRIVA HANDIHALER 18 MCG inhalation capsule  Generic drug:  tiotropium  Place 1 puff into inhaler and inhale daily.     tamsulosin 0.4 MG Caps capsule  Commonly known as:  FLOMAX  Take 1 capsule by mouth daily.     triamterene-hydrochlorothiazide 37.5-25 MG tablet  Commonly known as:  MAXZIDE-25  Take 1 tablet by mouth daily.     Vitamin D (Cholecalciferol) 1000 UNITS Caps  Take 1 tablet by mouth daily.        Allergies: No Known Allergies  Family History: Family History  Problem Relation Age of Onset   . Hypertension Mother   . Cancer Father     Lung  . Mental illness Brother     1-Schizophrenia  . GER disease Son   . CVA Mother     Social History:  reports that he has been smoking Cigarettes.  He started smoking about 41 years ago. He has a 41 pack-year smoking history. He has never used smokeless tobacco. He reports that he does not drink alcohol or use illicit drugs.  ROS: UROLOGY Frequent Urination?: No Hard to postpone urination?: No Burning/pain with urination?: No Get up at night to urinate?: No Leakage of urine?: No Urine stream starts and stops?: No Trouble starting stream?: No Do you have to strain to urinate?: No Blood in urine?: No Urinary tract infection?: No Sexually transmitted disease?: No Injury to kidneys or bladder?: No Painful intercourse?: No Weak stream?: No Erection problems?: No Penile pain?: No  Gastrointestinal Nausea?: No Vomiting?: No Indigestion/heartburn?: No Diarrhea?: No Constipation?: No  Constitutional Fever: No Night sweats?: No Weight loss?: No Fatigue?: No  Skin Skin rash/lesions?: No Itching?: No  Eyes Blurred vision?: No Double vision?: No  Ears/Nose/Throat Sore throat?: No Sinus problems?: No  Hematologic/Lymphatic Swollen glands?: No Easy bruising?: No  Cardiovascular Leg swelling?: No Chest pain?: No  Respiratory Cough?: No Shortness of breath?: No  Endocrine Excessive thirst?: No  Musculoskeletal Back pain?: No Joint pain?: No  Neurological Headaches?: No Dizziness?: No  Psychologic Depression?: No Anxiety?: No  Physical Exam: BP 120/56 mmHg  Pulse 47  Ht 5\' 11"  (1.803 m)  Wt 154 lb 12.8 oz (70.217 kg)  BMI 21.60 kg/m2  GU: Patient with circumcised phallus. Urethral meatus is patent.  No penile discharge. No penile lesions or rashes. Scrotum without lesions, cysts, rashes and/or edema.  Testicles are located scrotally bilaterally. No masses are appreciated in the testicles. Left and  right epididymis are normal. Rectal: Patient with  normal sphincter tone. Perineum without scarring or rashes. No rectal masses are appreciated. Prostate is approximately 50 grams, no nodules are appreciated. Seminal vesicles are normal.    Laboratory Data:  Lab Results  Component Value Date   CREATININE 1.31* 12/03/2014  Lab Results  Component Value Date   PSA 2.0 09/23/2013   PSA history:  1.8 ng/mL on 02/02/2014   1. BPH (benign prostatic hyperplasia) with LUTS:   I PSS score is 6/2. His symptoms are well controlled on tamsulosin 0.4 mg 1 daily.  He is not in need for a refill at this time.  He will follow-up in one year with and I PSS score, PSA and exam.  - PSA   Return in about 1 year (around 02/03/2016) for IPSS and exam.  Zara Council, [facility]  [facility] [address] [place], [place] [address] [phone]

## 2015-02-04 LAB — PSA: PROSTATE SPECIFIC AG, SERUM: 1.9 ng/mL (ref 0.0–4.0)

## 2015-02-05 ENCOUNTER — Telehealth: Payer: Self-pay

## 2015-02-05 NOTE — Telephone Encounter (Signed)
-----   Message from Nori Riis, PA-C sent at 02/04/2015  1:51 PM EDT ----- Patient's PSA is stable.  We will see him in 12 months.  PSA to be drawn before his next appointment.

## 2015-02-05 NOTE — Telephone Encounter (Signed)
Spoke with pt wife in reference to PSA results. Wife voiced understanding.  

## 2015-03-01 ENCOUNTER — Other Ambulatory Visit: Payer: Self-pay | Admitting: Family Medicine

## 2015-03-01 NOTE — Telephone Encounter (Signed)
Patient requesting refill. 

## 2015-03-08 ENCOUNTER — Other Ambulatory Visit: Payer: Self-pay | Admitting: Family Medicine

## 2015-03-29 ENCOUNTER — Telehealth: Payer: Self-pay | Admitting: *Deleted

## 2015-03-29 ENCOUNTER — Ambulatory Visit: Payer: Medicare Other | Admitting: Family Medicine

## 2015-03-29 NOTE — Telephone Encounter (Signed)
Notified patient that annual lung cancer screening low dose CT scan is due. Confirmed that patient is within the age range of 55-77, and asymptomatic, (no signs or symptoms of lung cancer). The patient is a current smoker, with a 31 pack year history. The shared decision making visit was done 11/21/13 Patient is agreeable for CT scan being scheduled.

## 2015-04-07 ENCOUNTER — Other Ambulatory Visit: Payer: Self-pay | Admitting: Family Medicine

## 2015-04-07 DIAGNOSIS — Z87891 Personal history of nicotine dependence: Secondary | ICD-10-CM

## 2015-04-09 ENCOUNTER — Other Ambulatory Visit: Payer: Self-pay | Admitting: Family Medicine

## 2015-04-09 DIAGNOSIS — Z87891 Personal history of nicotine dependence: Secondary | ICD-10-CM

## 2015-04-15 ENCOUNTER — Encounter: Payer: Self-pay | Admitting: Family Medicine

## 2015-04-15 ENCOUNTER — Ambulatory Visit (INDEPENDENT_AMBULATORY_CARE_PROVIDER_SITE_OTHER): Payer: Medicare Other | Admitting: Family Medicine

## 2015-04-15 VITALS — BP 122/76 | HR 68 | Temp 97.7°F | Resp 12 | Ht 71.0 in | Wt 157.4 lb

## 2015-04-15 DIAGNOSIS — J449 Chronic obstructive pulmonary disease, unspecified: Secondary | ICD-10-CM

## 2015-04-15 DIAGNOSIS — I700 Atherosclerosis of aorta: Secondary | ICD-10-CM | POA: Diagnosis not present

## 2015-04-15 DIAGNOSIS — N183 Chronic kidney disease, stage 3 unspecified: Secondary | ICD-10-CM

## 2015-04-15 DIAGNOSIS — I10 Essential (primary) hypertension: Secondary | ICD-10-CM

## 2015-04-15 DIAGNOSIS — E785 Hyperlipidemia, unspecified: Secondary | ICD-10-CM

## 2015-04-15 DIAGNOSIS — E538 Deficiency of other specified B group vitamins: Secondary | ICD-10-CM | POA: Diagnosis not present

## 2015-04-15 DIAGNOSIS — G63 Polyneuropathy in diseases classified elsewhere: Secondary | ICD-10-CM

## 2015-04-15 DIAGNOSIS — I2510 Atherosclerotic heart disease of native coronary artery without angina pectoris: Secondary | ICD-10-CM

## 2015-04-15 NOTE — Progress Notes (Signed)
Name: Kevin Roberts   MRN: [id number]    DOB: 06-01-1945   Date:04/15/2015       Progress Note  Subjective  Chief Complaint  Chief Complaint  Patient presents with  . Medication Refill    4 month F/U   . COPD  . Hypertension  . Gastroesophageal Reflux  . Hyperlipidemia    HPI  COPD Mild: he is taking Spiriva, no change in sputum production, he has decrease in exercise tolerance. No wheezing, denies nocturnal cough  HTN: taking medication, denies side effects and bp is at goal, no chest pain, occasionally has palpitation when he is upset  GERD: symptoms are under control as long as he takes Lanzoprazole, he states when he skips medications for one day he develops heartburn , epigastric pain and has regurgitation.  Hyperlipidemia: taking Atorvastatin and denies side effects. Taking aspirin daily . Denies myalgia  CAD: ( found on CT chest 3 vessel disease he denies chest pain , but he has decrease in exercise tolerance, it may be multifactorial from COPD also. He is willing to see cardiologist now. On aspirin, statins and beta-blocker  B12 neuropathy: he is taking B12 supplementation and also takes Neurontin - symptoms are stable  Tobacco Abuse: he has been a smoker for 41 years, he is up to date with triple A screening, negative in 2016, chest CT done in 11/2013 repeat CT scheduled for 04/21/2015. Explained importance of quitting smoking   Patient Active Problem List   Diagnosis Date Noted  . Atherosclerosis of aorta ([place]) 04/15/2015  . BPH with obstruction/lower urinary tract symptoms 02/03/2015  . Renal cyst, left 10/13/2014  . Tobacco use   . B12 neuropathy 10/04/2014  . CAD, multiple vessel 10/04/2014  . Benign essential HTN 10/04/2014  . Chronic kidney disease (CKD), stage III (moderate) 10/04/2014  . Dyslipidemia 10/04/2014  . COPD, mild ([place]) 10/04/2014  . Gastro-esophageal reflux disease without esophagitis 10/04/2014  . Enlarged prostate 10/04/2014  . Vitamin D  deficiency 10/04/2014    Past Surgical History  Procedure Laterality Date  . Throat biopsy      lump removed    Family History  Problem Relation Age of Onset  . Hypertension Mother   . Cancer Father     Lung  . Mental illness Brother     1-Schizophrenia  . GER disease Son   . CVA Mother     Social History   Social History  . Marital Status: Married    Spouse Name: N/A  . Number of Children: N/A  . Years of Education: N/A   Occupational History  . Not on file.   Social History Main Topics  . Smoking status: Current Every Day Smoker -- 1.00 packs/day for 41 years    Types: Cigarettes    Start date: 10/04/1973  . Smokeless tobacco: Never Used  . Alcohol Use: No  . Drug Use: No  . Sexual Activity: No   Other Topics Concern  . Not on file   Social History Narrative     Current outpatient prescriptions:  .  aspirin 81 MG tablet, Take 1 tablet by mouth daily., Disp: , Rfl:  .  atenolol (TENORMIN) 25 MG tablet, Take 1 tablet (25 mg total) by mouth daily., Disp: 90 tablet, Rfl: 1 .  atorvastatin (LIPITOR) 40 MG tablet, Take 1 tablet (40 mg total) by mouth daily., Disp: 90 tablet, Rfl: 1 .  Cyanocobalamin (B-12) 1000 MCG TBCR, Take 1 tablet by mouth daily., Disp: , Rfl:  .  gabapentin (NEURONTIN) 300 MG capsule, Take 1 capsule (300 mg total) by mouth 3 (three) times daily., Disp: 270 capsule, Rfl: 1 .  lansoprazole (PREVACID) 30 MG capsule, Take 1 capsule (30 mg total) by mouth daily., Disp: 90 capsule, Rfl: 1 .  MULTIPLE VITAMIN PO, Take 1 tablet by mouth daily., Disp: , Rfl:  .  potassium chloride SA (KLOR-CON M20) 20 MEQ tablet, Take 1 tablet (20 mEq total) by mouth daily., Disp: 90 tablet, Rfl: 1 .  SPIRIVA HANDIHALER 18 MCG inhalation capsule, INHALE ONE DOSE BY MOUTH ONCE DAILY FOR COPD., Disp: 90 capsule, Rfl: 0 .  tamsulosin (FLOMAX) 0.4 MG CAPS capsule, Take 1 capsule by mouth daily., Disp: , Rfl:  .  triamterene-hydrochlorothiazide (MAXZIDE-25) 37.5-25 MG  tablet, TAKE ONE-HALF TABLET BY MOUTH ONCE DAILY, Disp: 45 tablet, Rfl: 5 .  Vitamin D, Cholecalciferol, 1000 UNITS CAPS, Take 1 tablet by mouth daily., Disp: , Rfl:   No Known Allergies   ROS  Constitutional: Negative for fever or weight change.  Respiratory: Positive  for occasional cough and shortness of breath.   Cardiovascular: Negative for chest pain , occasionally has palpitations.  Gastrointestinal: Negative for abdominal pain, no bowel changes.  Musculoskeletal: Negative for gait problem or joint swelling.  Skin: Negative for rash.  Neurological: Negative for dizziness or headache.  No other specific complaints in a complete review of systems (except as listed in HPI above).  Objective  Filed Vitals:   04/15/15 1141  BP: 122/76  Pulse: 68  Temp: 97.7 F (36.5 C)  TempSrc: Oral  Resp: 12  Height: 5\' 11"  (1.803 m)  Weight: 157 lb 6.4 oz (71.396 kg)  SpO2: 95%    Body mass index is 21.96 kg/(m^2).  Physical Exam  Constitutional: Patient appears well-developed and well-nourished.  No distress.  HEENT: head atraumatic, normocephalic, pupils equal and reactive to light, neck supple, throat within normal limits Cardiovascular: Normal rate, regular rhythm and normal heart sounds.  No murmur heard. No BLE edema. Pulmonary/Chest: Effort normal and breath sounds normal. No respiratory distress. Abdominal: Soft.  There is no tenderness. Psychiatric: Patient has a normal mood and affect. behavior is normal. Judgment and thought content normal.  Recent Results (from the past 2160 hour(s))  PSA     Status: None   Collection Time: 02/03/15 10:30 AM  Result Value Ref Range   Prostate Specific Ag, Serum 1.9 0.0 - 4.0 ng/mL    Comment: Roche ECLIA methodology. According to the American Urological Association, Serum PSA should decrease and remain at undetectable levels after radical prostatectomy. The AUA defines biochemical recurrence as an initial PSA value 0.2 ng/mL or  greater followed by a subsequent confirmatory PSA value 0.2 ng/mL or greater. Values obtained with different assay methods or kits cannot be used interchangeably. Results cannot be interpreted as absolute evidence of the presence or absence of malignant disease.      PHQ2/9: Depression screen [facility] 2/9 04/15/2015 10/05/2014  Decreased Interest 0 0  Down, Depressed, Hopeless 0 0  PHQ - 2 Score 0 0    Fall Risk: Fall Risk  04/15/2015 10/05/2014  Falls in the past year? No No    Functional Status Survey: Is the patient deaf or have difficulty hearing?: No Does the patient have difficulty seeing, even when wearing glasses/contacts?: No Does the patient have difficulty concentrating, remembering, or making decisions?: No Does the patient have difficulty walking or climbing stairs?: No Does the patient have difficulty dressing or bathing?: No Does the patient  have difficulty doing errands alone such as visiting a doctor's office or shopping?: No    Assessment & Plan  1. COPD, mild ([place])  Needs to quit smoking, continue Spiriva  2. Chronic kidney disease (CKD), stage III (moderate)  Stable, recheck next visit  3. Benign essential HTN  Well controlled with medication   4. CAD, multiple vessel  Discussed importance of quitting smoking, he is willing to see a cardiologist at this time -cardiologist   5. Dyslipidemia  Continue statin therapy   6. Atherosclerosis of aorta ([facility])  Continue statin therapy and aspirin  7. B12 neuropathy  Continue supplementation   8. Hypercalcemia  Refused to have it repeated today, we will check it on his next visit

## 2015-04-21 ENCOUNTER — Ambulatory Visit
Admission: RE | Admit: 2015-04-21 | Discharge: 2015-04-21 | Disposition: A | Discharge status: Home / Self Care | Payer: Medicare Other | Source: Ambulatory Visit | Attending: Family Medicine | Admitting: Family Medicine

## 2015-04-21 DIAGNOSIS — I2510 Atherosclerotic heart disease of native coronary artery without angina pectoris: Secondary | ICD-10-CM | POA: Insufficient documentation

## 2015-04-21 DIAGNOSIS — Z87891 Personal history of nicotine dependence: Secondary | ICD-10-CM | POA: Diagnosis not present

## 2015-04-21 DIAGNOSIS — N62 Hypertrophy of breast: Secondary | ICD-10-CM | POA: Insufficient documentation

## 2015-04-23 ENCOUNTER — Telehealth: Payer: Self-pay | Admitting: *Deleted

## 2015-04-23 NOTE — Telephone Encounter (Signed)
Notified patient of LDCT lung cancer screening results of Lung Rads 2 finding with recommendation for 12 month follow up imaging. Also notified of incidental finding noted below. Patient verbalizes understanding.   IMPRESSION: 1. Lung-RADS Category 2, benign appearance or behavior. Continue annual screening with low-dose chest CT without contrast in 12 months. 2. Atherosclerosis, including within the coronary arteries. 3. Mild gynecomastia

## 2015-06-10 ENCOUNTER — Ambulatory Visit (INDEPENDENT_AMBULATORY_CARE_PROVIDER_SITE_OTHER): Payer: Medicare Other | Admitting: Cardiovascular Disease

## 2015-06-10 ENCOUNTER — Encounter: Payer: Self-pay | Admitting: Cardiovascular Disease

## 2015-06-10 VITALS — BP 108/64 | HR 64 | Ht 71.0 in | Wt 157.8 lb

## 2015-06-10 DIAGNOSIS — I2510 Atherosclerotic heart disease of native coronary artery without angina pectoris: Secondary | ICD-10-CM | POA: Diagnosis not present

## 2015-06-10 DIAGNOSIS — R0602 Shortness of breath: Secondary | ICD-10-CM | POA: Diagnosis not present

## 2015-06-10 NOTE — Patient Instructions (Addendum)
Medication Instructions:  Your physician recommends that you continue on your current medications as directed. Please refer to the Current Medication list given to you today.   Labwork: none  Testing/Procedures: Your physician has requested that you have a lexiscan myoview. For further information please visit [email or web address]. Please follow instruction sheet, as given.  [place]  Your caregiver has ordered a Stress Test with nuclear imaging. The purpose of this test is to evaluate the blood supply to your heart muscle. This procedure is referred to as a "Non-Invasive Stress Test." This is because other than having an IV started in your vein, nothing is inserted or "invades" your body. Cardiac stress tests are done to find areas of poor blood flow to the heart by determining the extent of coronary artery disease (CAD). Some patients exercise on a treadmill, which naturally increases the blood flow to your heart, while others who are  unable to walk on a treadmill due to physical limitations have a pharmacologic/chemical stress agent called Lexiscan . This medicine will mimic walking on a treadmill by temporarily increasing your coronary blood flow.   Please note: these test may take anywhere between 2-4 hours to complete  PLEASE REPORT TO [place] AT THE FIRST DESK WILL DIRECT YOU WHERE TO GO  Date of Procedure:_____Wed, March 8__________________  Arrival Time for Procedure:_____8:15am____________________  Instructions regarding medication:    _xx___:  Hold atenolol the night before procedure and morning of procedure   PLEASE NOTIFY THE OFFICE AT LEAST 24 HOURS IN ADVANCE IF YOU ARE UNABLE TO KEEP YOUR APPOINTMENT.  [phone] AND  PLEASE NOTIFY NUCLEAR MEDICINE AT [facility] AT LEAST 24 HOURS IN ADVANCE IF YOU ARE UNABLE TO KEEP YOUR APPOINTMENT. [phone]  How to prepare for your Myoview test:   Do not eat or drink after midnight  No  caffeine for 24 hours prior to test  No smoking 24 hours prior to test.  Your medication may be taken with water.  If your doctor stopped a medication because of this test, do not take that medication.  Ladies, please do not wear dresses.  Skirts or pants are appropriate. Please wear a short sleeve shirt.  No perfume, cologne or lotion.  Wear comfortable walking shoes. No heels!            Follow-Up: Your physician recommends that you schedule a follow-up appointment as needed.    Any Other Special Instructions Will Be Listed Below (If Applicable).     If you need a refill on your cardiac medications before your next appointment, please call your pharmacy.  Cardiac Nuclear Scanning A cardiac nuclear scan is used to check your heart for problems, such as the following:  A portion of the heart is not getting enough blood.  Part of the heart muscle has died, which happens with a heart attack.  The heart wall is not working normally.  In this test, a radioactive dye (tracer) is injected into your bloodstream. After the tracer has traveled to your heart, a scanning device is used to measure how much of the tracer is absorbed by or distributed to various areas of your heart. LET [facility] CARE PROVIDER KNOW ABOUT:  Any allergies you have.  All medicines you are taking, including vitamins, herbs, eye drops, creams, and over-the-counter medicines.  Previous problems you or members of your family have had with the use of anesthetics.  Any blood disorders you have.  Previous surgeries you have had.  Medical conditions you have.  RISKS AND COMPLICATIONS Generally, this is a safe procedure. However, as with any procedure, problems can occur. Possible problems include:   Serious chest pain.  Rapid heartbeat.  Sensation of warmth in your chest. This usually passes quickly. BEFORE THE PROCEDURE Ask your health care provider about changing or stopping your regular  medicines. PROCEDURE This procedure is usually done at a hospital and takes 2-4 hours.  An IV tube is inserted into one of your veins.  Your health care provider will inject a small amount of radioactive tracer through the tube.  You will then wait for 20-40 minutes while the tracer travels through your bloodstream.  You will lie down on an exam table so images of your heart can be taken. Images will be taken for about 15-20 minutes.  You will exercise on a treadmill or stationary bike. While you exercise, your heart activity will be monitored with an electrocardiogram (ECG), and your blood pressure will be checked.  If you are unable to exercise, you may be given a medicine to make your heart beat faster.  When blood flow to your heart has peaked, tracer will again be injected through the IV tube.  After 20-40 minutes, you will get back on the exam table and have more images taken of your heart.  When the procedure is over, your IV tube will be removed. AFTER THE PROCEDURE  You will likely be able to leave shortly after the test. Unless your health care provider tells you otherwise, you may return to your normal schedule, including diet, activities, and medicines.  Make sure you find out how and when you will get your test results.   This information is not intended to replace advice given to you by your health care provider. Make sure you discuss any questions you have with your health care provider.   Document Released: 04/21/2004 Document Revised: 04/01/2013 Document Reviewed: 03/05/2013 Elsevier Interactive Patient Education [employer].

## 2015-06-10 NOTE — Progress Notes (Signed)
Cardiology Office Note   Date:  06/10/2015   ID:  Kevin Roberts, DOB [birth year], MRN [id number]  PCP:  Kevin Chance, MD  Cardiologist:   Kevin Sacramento, MD   Chief Complaint  Patient presents with  . OTHER    No complaints. Meds reviewed verbally with pt.      History of Present Illness: Kevin Roberts is a 70 y.o. male who was referred by Dr.Sowles for evaluation of coronary atherosclerosis noted on CT scan. The patient has no previous cardiac history. However, he has extensive risk factors including hypertension, hyperlipidemia and prolonged history of tobacco use. He smokes one and a half pack per day. He also has COPD. Abdominal aortic ultrasound showed no evidence of aortic aneurysm in 2016. He recently had CT scan of the lungs for lung cancer screening which showed evidence of coronary atherosclerosis. The patient denies any chest pain, orthopnea, PND or lower extremity edema. He has no leg claudication. He does complain of significant exertional dyspnea thought to be due to his known COPD and continued smoking. No previous cardiac history. He has no family history of premature coronary artery disease.    Past Medical History  Diagnosis Date  . GERD (gastroesophageal reflux disease)   . Hypertension   . Hyperlipidemia   . COPD (chronic obstructive pulmonary disease) ([place])   . Chronic kidney disease, stage III (moderate)   . History of hypercalcemia   . Elevated ferritin   . Benign prostatic hypertrophy   . Peripheral polyneuropathy ([place])   . Tobacco use   . Vitamin B 12 deficiency   . Cramps, muscle, general   . Nocturia   . Urgency of micturation   . Incomplete bladder emptying   . Rising PSA level   . CAD (coronary atherosclerotic disease)     Past Surgical History  Procedure Laterality Date  . Throat biopsy      lump removed     Current Outpatient Prescriptions  Medication Sig Dispense Refill  . aspirin 81 MG tablet Take 1 tablet by mouth daily.      Marland Kitchen atenolol (TENORMIN) 25 MG tablet Take 1 tablet (25 mg total) by mouth daily. 90 tablet 1  . atorvastatin (LIPITOR) 40 MG tablet Take 1 tablet (40 mg total) by mouth daily. 90 tablet 1  . Cyanocobalamin (B-12) 1000 MCG TBCR Take 1 tablet by mouth daily.    Marland Kitchen gabapentin (NEURONTIN) 300 MG capsule Take 1 capsule (300 mg total) by mouth 3 (three) times daily. (Patient taking differently: Take 300 mg by mouth 2 (two) times daily. ) 270 capsule 1  . lansoprazole (PREVACID) 30 MG capsule Take 1 capsule (30 mg total) by mouth daily. 90 capsule 1  . MULTIPLE VITAMIN PO Take 1 tablet by mouth daily.    . potassium chloride SA (KLOR-CON M20) 20 MEQ tablet Take 1 tablet (20 mEq total) by mouth daily. 90 tablet 1  . SPIRIVA HANDIHALER 18 MCG inhalation capsule INHALE ONE DOSE BY MOUTH ONCE DAILY FOR COPD. 90 capsule 0  . tamsulosin (FLOMAX) 0.4 MG CAPS capsule Take 1 capsule by mouth daily.    Marland Kitchen triamterene-hydrochlorothiazide (MAXZIDE-25) 37.5-25 MG tablet TAKE ONE-HALF TABLET BY MOUTH ONCE DAILY 45 tablet 5  . Vitamin D, Cholecalciferol, 1000 UNITS CAPS Take 1 tablet by mouth daily.     No current facility-administered medications for this visit.    Allergies:   Review of patient's allergies indicates no known allergies.    Social History:  The patient  reports that he has been smoking Cigarettes.  He started smoking about 41 years ago. He has a 41 pack-year smoking history. He has never used smokeless tobacco. He reports that he does not drink alcohol or use illicit drugs.   Family History:  The patient's family history includes CVA in his mother; Cancer in his father; GER disease in his son; Heart Problems in his brother; Hypertension in his mother; Mental illness in his brother.    ROS:  Please see the history of present illness.   Otherwise, review of systems are positive for none.   All other systems are reviewed and negative.    PHYSICAL EXAM: VS:  BP 108/64 mmHg  Pulse 64  Ht 5\' 11"   (1.803 m)  Wt 157 lb 12 oz (71.555 kg)  BMI 22.01 kg/m2 , BMI Body mass index is 22.01 kg/(m^2). GEN: Well nourished, well developed, in no acute distress HEENT: normal Neck: no JVD, carotid bruits, or masses Cardiac: RRR; no murmurs, rubs, or gallops,no edema  Respiratory:  clear to auscultation bilaterally, normal work of breathing GI: soft, nontender, nondistended, + BS MS: no deformity or atrophy Skin: warm and dry, no rash Neuro:  Strength and sensation are intact Psych: euthymic mood, full affect   EKG:  EKG is ordered today. The ekg ordered today demonstrates sinus rhythm with first degree AV block, minor anterior ST elevation consistent with early repolarization.   Recent Labs: 12/03/2014: ALT 21; BUN 12; Creatinine, Ser 1.31*; Potassium 4.4; Sodium 139    Lipid Panel    Component Value Date/Time   CHOL 92* 12/03/2014 1216   CHOL 98 03/02/2014   TRIG 48 12/03/2014 1216   HDL 49 12/03/2014 1216   HDL 51 03/02/2014   CHOLHDL 1.9 12/03/2014 1216   LDLCALC 33 12/03/2014 1216   LDLCALC 36 03/02/2014      Wt Readings from Last 3 Encounters:  06/10/15 157 lb 12 oz (71.555 kg)  04/21/15 157 lb (71.215 kg)  04/15/15 157 lb 6.4 oz (71.396 kg)         ASSESSMENT AND PLAN:  1.  Coronary atherosclerosis: This was noted on CT scan of the lungs. The patient has multiple risk factors for coronary artery disease. ECG is slightly abnormal but the ST changes are consistent with early repolarization. I requested a treadmill nuclear stress test to ensure no obstructive coronary artery disease. I discussed with the patient the importance of lifestyle changes in order to decrease the Roberts of future coronary artery disease and cardiovascular events. We discussed the importance of controlling risk factors, healthy diet as well as regular exercise.  2. Hyperlipidemia: I agree with treatment with atorvastatin given his future risk of cardiovascular disease.  3. Tobacco use: I had  a prolonged discussion with him about the importance of smoking cessation.  4.  Essential hypertension: Blood pressure is controlled on current medications.      Disposition:   FU with me as needed if stress test is abnormal.  Signed, Kevin Sacramento, MD  06/10/2015 12:57 PM    [place]

## 2015-06-15 ENCOUNTER — Telehealth: Payer: Self-pay | Admitting: Cardiovascular Disease

## 2015-06-15 NOTE — Telephone Encounter (Signed)
Reviewed myoview instructions w/pt who verbalized understanding and is agreeable w/plan.  

## 2015-06-16 ENCOUNTER — Encounter
Admission: RE | Admit: 2015-06-16 | Discharge: 2015-06-16 | Disposition: A | Discharge status: Home / Self Care | Payer: Medicare Other | Source: Ambulatory Visit | Attending: Cardiovascular Disease | Admitting: Cardiovascular Disease

## 2015-06-16 DIAGNOSIS — R0602 Shortness of breath: Secondary | ICD-10-CM | POA: Insufficient documentation

## 2015-06-17 ENCOUNTER — Other Ambulatory Visit: Payer: Self-pay

## 2015-06-17 MED ORDER — TIOTROPIUM BROMIDE MONOHYDRATE 18 MCG IN CAPS: ORAL_CAPSULE | RESPIRATORY_TRACT | Status: DC

## 2015-06-17 NOTE — Telephone Encounter (Signed)
Got a fax from [place] requesting a refill of this patient's Spiriva  Refill request was sent to Dr. Steele Sizer for approval and submission.

## 2015-06-18 ENCOUNTER — Ambulatory Visit
Admission: RE | Admit: 2015-06-18 | Discharge: 2015-06-18 | Disposition: A | Discharge status: Home / Self Care | Payer: Medicare Other | Source: Ambulatory Visit | Attending: Cardiovascular Disease | Admitting: Cardiovascular Disease

## 2015-06-18 DIAGNOSIS — R9439 Abnormal result of other cardiovascular function study: Secondary | ICD-10-CM | POA: Insufficient documentation

## 2015-06-18 DIAGNOSIS — R0602 Shortness of breath: Secondary | ICD-10-CM | POA: Insufficient documentation

## 2015-06-18 LAB — NM MYOCAR MULTI W/SPECT W/WALL MOTION / EF
CHL CUP MPHR: 152 {beats}/min
CHL CUP NUCLEAR SDS: 0
CHL CUP NUCLEAR SSS: 10
CHL CUP STRESS STAGE 2 HR: 57 {beats}/min
CHL CUP STRESS STAGE 3 GRADE: 0 %
CHL CUP STRESS STAGE 3 HR: 56 {beats}/min
CHL CUP STRESS STAGE 4 GRADE: 0 %
CHL CUP STRESS STAGE 5 HR: 74 {beats}/min
CHL CUP STRESS STAGE 5 SPEED: 0 mph
CSEPEW: 1 METS
CSEPHR: 59 %
CSEPPHR: 84 {beats}/min
Exercise duration (min): 0 min
Exercise duration (sec): 0 s
LV sys vol: 41 mL
LVDIAVOL: 87 mL (ref 62–150)
Percent of predicted max HR: 55 %
Rest HR: 58 {beats}/min
SRS: 1
Stage 1 HR: 65 {beats}/min
Stage 2 Grade: 0 %
Stage 2 Speed: 0 mph
Stage 3 Speed: 0 mph
Stage 4 HR: 84 {beats}/min
Stage 4 Speed: 0 mph
Stage 5 DBP: 57 mmHg
Stage 5 Grade: 0 %
Stage 5 SBP: 111 mmHg
TID: 0.95

## 2015-06-18 MED ORDER — TECHNETIUM TC 99M SESTAMIBI - CARDIOLITE
31.8550 | Freq: Once | INTRAVENOUS | Status: AC | PRN
  Administered 2015-06-18: 31.855 via INTRAVENOUS

## 2015-06-18 MED ORDER — TECHNETIUM TC 99M SESTAMIBI - CARDIOLITE
14.0160 | Freq: Once | INTRAVENOUS | Status: AC | PRN
  Administered 2015-06-18: 09:00:00 14.016 via INTRAVENOUS

## 2015-06-22 ENCOUNTER — Other Ambulatory Visit: Payer: Self-pay

## 2015-06-22 ENCOUNTER — Encounter: Payer: Self-pay | Admitting: Cardiovascular Disease

## 2015-06-22 DIAGNOSIS — Z01812 Encounter for preprocedural laboratory examination: Secondary | ICD-10-CM

## 2015-06-22 DIAGNOSIS — R0602 Shortness of breath: Secondary | ICD-10-CM

## 2015-06-22 NOTE — Patient Instructions (Signed)
Your physician has requested that you have a cardiac catheterization. Cardiac catheterization is used to diagnose and/or treat various heart conditions. Doctors may recommend this procedure for a number of different reasons. The most common reason is to evaluate chest pain. Chest pain can be a symptom of coronary artery disease (CAD), and cardiac catheterization can show whether plaque is narrowing or blocking your heart's arteries. This procedure is also used to evaluate the valves, as well as measure the blood flow and oxygen levels in different parts of your heart. For further information please visit [email or web address]. Please follow instruction sheet, as given.  [facility] Cardiac Cath Instructions   You are scheduled for a Cardiac Cath on:__Friday, March 24_______  Please arrive at 6:30am on the day of your procedure  You will need to pre-register prior to the day of your procedure.  Enter through the [employer] at [facility].  Registration is the first desk on your right.  Please take the procedure order we have given you in order to be registered appropriately  Do not eat/drink anything after midnight  Someone will need to drive you home  It is recommended someone be with you for the first 24 hours after your procedure  Wear clothes that are easy to get on/off and wear slip on shoes if possible   Medications bring a current list of all medications with you  _xx__ You may take all of your medications the morning of your procedure with enough water to swallow safely    Day of your procedure: Arrive at the [place] entrance.  Free valet service is available.  After entering the [place] please check-in at the registration desk (1st desk on your right) to receive your armband. After receiving your armband someone will escort you to the cardiac cath/special procedures waiting area.  The usual length of stay after your procedure is about 2 to 3 hours.  This can vary.  If you have any  questions, please call our office at [phone], or you may call the cardiac cath lab at [facility] directly at [phone] Angiogram An angiogram, also called angiography, is a procedure used to look at the blood vessels. In this procedure, dye is injected through a long, thin tube (catheter) into an artery. X-rays are then taken. The X-rays will show if there is a blockage or problem in a blood vessel.  LET [facility] CARE PROVIDER KNOW ABOUT:  Any allergies you have, including allergies to shellfish or contrast dye.   All medicines you are taking, including vitamins, herbs, eye drops, creams, and over-the-counter medicines.   Previous problems you or members of your family have had with the use of anesthetics.   Any blood disorders you have.   Previous surgeries you have had.  Any previous kidney problems or failure you have had.  Medical conditions you have.   Possibility of pregnancy, if this applies. RISKS AND COMPLICATIONS Generally, an angiogram is a safe procedure. However, as with any procedure, problems can occur. Possible problems include:  Injury to the blood vessels, including rupture or bleeding.  Infection or bruising at the catheter site.  Allergic reaction to the dye or contrast used.  Kidney damage from the dye or contrast used.  Blood clots that can lead to a stroke or heart attack. BEFORE THE PROCEDURE  Do not eat or drink after midnight on the night before the procedure, or as directed by your health care provider.   Ask your health care provider if you may  drink enough water to take any needed medicines the morning of the procedure.  PROCEDURE  You may be given a medicine to help you relax (sedative) before and during the procedure. This medicine is given through an IV access tube that is inserted into one of your veins.   The area where the catheter will be inserted will be washed and shaved. This is usually done in the groin but may be done in  the fold of your arm (near your elbow) or in the wrist.  A medicine will be given to numb the area where the catheter will be inserted (local anesthetic).  The catheter will be inserted with a guide wire into an artery. The catheter is guided by using a type of X-ray (fluoroscopy) to the blood vessel being examined.   Dye is then injected into the catheter, and X-rays are taken. The dye helps to show where any narrowing or blockages are located.  AFTER THE PROCEDURE   If the procedure is done through the leg, you will be kept in bed lying flat for several hours. You will be instructed to not bend or cross your legs.  The insertion site will be checked frequently.  The pulse in your feet or wrist will be checked frequently.  Additional blood tests, X-rays, and electrocardiography may be done.   You may need to stay in the hospital overnight for observation.    This information is not intended to replace advice given to you by your health care provider. Make sure you discuss any questions you have with your health care provider.   Document Released: 01/04/2005 Document Revised: 04/17/2014 Document Reviewed: 08/28/2012 Elsevier Interactive Patient Education 2016 [place] After Refer to this sheet in the next few weeks. These instructions provide you with information about caring for yourself after your procedure. Your health care provider may also give you more specific instructions. Your treatment has been planned according to current medical practices, but problems sometimes occur. Call your health care provider if you have any problems or questions after your procedure. WHAT TO EXPECT AFTER THE PROCEDURE After your procedure, it is typical to have the following:  Bruising at the catheter insertion site that usually fades within 1-2 weeks.  Blood collecting in the tissue (hematoma) that may be painful to the touch. It should usually decrease in size and  tenderness within 1-2 weeks. HOME CARE INSTRUCTIONS  Take medicines only as directed by your health care provider.  You may shower 24-48 hours after the procedure or as directed by your health care provider. Remove the bandage (dressing) and gently wash the site with plain soap and water. Pat the area dry with a clean towel. Do not rub the site, because this may cause bleeding.  Do not take baths, swim, or use a hot tub until your health care provider approves.  Check your insertion site every day for redness, swelling, or drainage.  Do not apply powder or lotion to the site.  Do not lift over 10 lb (4.5 kg) for 5 days after your procedure or as directed by your health care provider.  Ask your health care provider when it is okay to:  Return to work or school.  Resume usual physical activities or sports.  Resume sexual activity.  Do not drive home if you are discharged the same day as the procedure. Have someone else drive you.  You may drive 24 hours after the procedure unless otherwise instructed by your health care  provider.  Do not operate machinery or power tools for 24 hours after the procedure or as directed by your health care provider.  If your procedure was done as an outpatient procedure, which means that you went home the same day as your procedure, a responsible adult should be with you for the first 24 hours after you arrive home.  Keep all follow-up visits as directed by your health care provider. This is important. SEEK MEDICAL CARE IF:  You have a fever.  You have chills.  You have increased bleeding from the catheter insertion site. Hold pressure on the site. SEEK IMMEDIATE MEDICAL CARE IF:  You have unusual pain at the catheter insertion site.  You have redness, warmth, or swelling at the catheter insertion site.  You have drainage (other than a small amount of blood on the dressing) from the catheter insertion site.  The catheter insertion site is  bleeding, and the bleeding does not stop after 30 minutes of holding steady pressure on the site.  The area near or just beyond the catheter insertion site becomes pale, cool, tingly, or numb.   This information is not intended to replace advice given to you by your health care provider. Make sure you discuss any questions you have with your health care provider.   Document Released: 10/13/2004 Document Revised: 04/17/2014 Document Reviewed: 08/28/2012 Elsevier Interactive Patient Education [employer].

## 2015-06-28 ENCOUNTER — Ambulatory Visit
Admission: RE | Admit: 2015-06-28 | Discharge: 2015-06-28 | Disposition: A | Discharge status: Home / Self Care | Payer: Medicare Other | Source: Ambulatory Visit | Attending: Cardiovascular Disease | Admitting: Cardiovascular Disease

## 2015-06-28 ENCOUNTER — Other Ambulatory Visit
Admission: RE | Admit: 2015-06-28 | Discharge: 2015-06-28 | Disposition: A | Discharge status: Home / Self Care | Payer: Medicare Other | Source: Ambulatory Visit | Attending: Cardiovascular Disease | Admitting: Cardiovascular Disease

## 2015-06-28 ENCOUNTER — Other Ambulatory Visit: Payer: Self-pay

## 2015-06-28 DIAGNOSIS — I10 Essential (primary) hypertension: Secondary | ICD-10-CM

## 2015-06-28 DIAGNOSIS — R0602 Shortness of breath: Secondary | ICD-10-CM | POA: Insufficient documentation

## 2015-06-28 DIAGNOSIS — Z01812 Encounter for preprocedural laboratory examination: Secondary | ICD-10-CM

## 2015-06-28 DIAGNOSIS — J449 Chronic obstructive pulmonary disease, unspecified: Secondary | ICD-10-CM | POA: Diagnosis not present

## 2015-06-28 LAB — CBC
HCT: 46.1 % (ref 40.0–52.0)
Hemoglobin: 15.6 g/dL (ref 13.0–18.0)
MCH: 30.9 pg (ref 26.0–34.0)
MCHC: 33.9 g/dL (ref 32.0–36.0)
MCV: 91.1 fL (ref 80.0–100.0)
PLATELETS: 163 10*3/uL (ref 150–440)
RBC: 5.06 MIL/uL (ref 4.40–5.90)
RDW: 13.2 % (ref 11.5–14.5)
WBC: 7.6 10*3/uL (ref 3.8–10.6)

## 2015-06-28 LAB — BASIC METABOLIC PANEL
Anion gap: 5 (ref 5–15)
BUN: 15 mg/dL (ref 6–20)
CALCIUM: 9.4 mg/dL (ref 8.9–10.3)
CHLORIDE: 105 mmol/L (ref 101–111)
CO2: 27 mmol/L (ref 22–32)
CREATININE: 1.51 mg/dL — AB (ref 0.61–1.24)
GFR calc non Af Amer: 46 mL/min — ABNORMAL LOW (ref >60)
GFR, EST AFRICAN AMERICAN: 53 mL/min — AB (ref >60)
GLUCOSE: 99 mg/dL (ref 65–99)
Potassium: 3.7 mmol/L (ref 3.5–5.1)
Sodium: 137 mmol/L (ref 135–145)

## 2015-06-28 LAB — PROTIME-INR
INR: 1.17
Prothrombin Time: 15.1 seconds — ABNORMAL HIGH (ref 11.4–15.0)

## 2015-06-28 MED ORDER — ATENOLOL 25 MG PO TABS: 25.0000 mg | ORAL_TABLET | Freq: Every day | ORAL | Status: DC

## 2015-06-28 NOTE — Telephone Encounter (Signed)
Patient requesting refill. 

## 2015-07-01 ENCOUNTER — Telehealth: Payer: Self-pay | Admitting: Cardiovascular Disease

## 2015-07-01 NOTE — Telephone Encounter (Signed)
S/w pt and reviewed cardiac cath instructions. Pt verbalized understanding and is agreeable w/plan.

## 2015-07-02 ENCOUNTER — Encounter: Payer: Self-pay | Admitting: *Deleted

## 2015-07-02 ENCOUNTER — Encounter
Admission: RE | Disposition: A | Discharge status: Home / Self Care | Payer: Self-pay | Source: Ambulatory Visit | Attending: Cardiovascular Disease

## 2015-07-02 ENCOUNTER — Ambulatory Visit
Admission: RE | Admit: 2015-07-02 | Discharge: 2015-07-02 | Disposition: A | Discharge status: Home / Self Care | Payer: Medicare Other | Source: Ambulatory Visit | Attending: Cardiovascular Disease | Admitting: Cardiovascular Disease

## 2015-07-02 DIAGNOSIS — Z7982 Long term (current) use of aspirin: Secondary | ICD-10-CM | POA: Insufficient documentation

## 2015-07-02 DIAGNOSIS — I259 Chronic ischemic heart disease, unspecified: Secondary | ICD-10-CM | POA: Insufficient documentation

## 2015-07-02 DIAGNOSIS — R9439 Abnormal result of other cardiovascular function study: Secondary | ICD-10-CM | POA: Insufficient documentation

## 2015-07-02 DIAGNOSIS — K219 Gastro-esophageal reflux disease without esophagitis: Secondary | ICD-10-CM | POA: Diagnosis not present

## 2015-07-02 DIAGNOSIS — Z818 Family history of other mental and behavioral disorders: Secondary | ICD-10-CM | POA: Diagnosis not present

## 2015-07-02 DIAGNOSIS — I129 Hypertensive chronic kidney disease with stage 1 through stage 4 chronic kidney disease, or unspecified chronic kidney disease: Secondary | ICD-10-CM | POA: Insufficient documentation

## 2015-07-02 DIAGNOSIS — R338 Other retention of urine: Secondary | ICD-10-CM | POA: Diagnosis not present

## 2015-07-02 DIAGNOSIS — N183 Chronic kidney disease, stage 3 (moderate): Secondary | ICD-10-CM | POA: Insufficient documentation

## 2015-07-02 DIAGNOSIS — Z79899 Other long term (current) drug therapy: Secondary | ICD-10-CM | POA: Diagnosis not present

## 2015-07-02 DIAGNOSIS — G629 Polyneuropathy, unspecified: Secondary | ICD-10-CM | POA: Diagnosis not present

## 2015-07-02 DIAGNOSIS — R351 Nocturia: Secondary | ICD-10-CM | POA: Insufficient documentation

## 2015-07-02 DIAGNOSIS — I208 Other forms of angina pectoris: Secondary | ICD-10-CM | POA: Insufficient documentation

## 2015-07-02 DIAGNOSIS — J449 Chronic obstructive pulmonary disease, unspecified: Secondary | ICD-10-CM | POA: Diagnosis not present

## 2015-07-02 DIAGNOSIS — E538 Deficiency of other specified B group vitamins: Secondary | ICD-10-CM | POA: Insufficient documentation

## 2015-07-02 DIAGNOSIS — F17210 Nicotine dependence, cigarettes, uncomplicated: Secondary | ICD-10-CM | POA: Diagnosis not present

## 2015-07-02 DIAGNOSIS — Z809 Family history of malignant neoplasm, unspecified: Secondary | ICD-10-CM | POA: Diagnosis not present

## 2015-07-02 DIAGNOSIS — Z823 Family history of stroke: Secondary | ICD-10-CM | POA: Diagnosis not present

## 2015-07-02 DIAGNOSIS — N401 Enlarged prostate with lower urinary tract symptoms: Secondary | ICD-10-CM | POA: Diagnosis not present

## 2015-07-02 DIAGNOSIS — I2510 Atherosclerotic heart disease of native coronary artery without angina pectoris: Secondary | ICD-10-CM | POA: Diagnosis not present

## 2015-07-02 DIAGNOSIS — E785 Hyperlipidemia, unspecified: Secondary | ICD-10-CM | POA: Diagnosis not present

## 2015-07-02 DIAGNOSIS — Z8249 Family history of ischemic heart disease and other diseases of the circulatory system: Secondary | ICD-10-CM | POA: Insufficient documentation

## 2015-07-02 HISTORY — PX: CARDIAC CATHETERIZATION: SHX172

## 2015-07-02 SURGERY — LEFT HEART CATH AND CORONARY ANGIOGRAPHY
Anesthesia: Moderate Sedation

## 2015-07-02 MED ORDER — SODIUM CHLORIDE 0.9% FLUSH
3.0000 mL | Freq: Two times a day (BID) | INTRAVENOUS | Status: DC
  Administered 2015-07-02: 3 mL via INTRAVENOUS

## 2015-07-02 MED ORDER — ASPIRIN 81 MG PO CHEW: 81.0000 mg | CHEWABLE_TABLET | ORAL | Status: DC

## 2015-07-02 MED ORDER — VERAPAMIL HCL 2.5 MG/ML IV SOLN
INTRAVENOUS | Status: DC | PRN
  Administered 2015-07-02: 2.5 mg via INTRA_ARTERIAL

## 2015-07-02 MED ORDER — SODIUM CHLORIDE 0.9% FLUSH: 3.0000 mL | INTRAVENOUS | Status: DC | PRN

## 2015-07-02 MED ORDER — MIDAZOLAM HCL 2 MG/2ML IJ SOLN
INTRAMUSCULAR | Status: DC | PRN
  Administered 2015-07-02 (×2): 1 mg via INTRAVENOUS

## 2015-07-02 MED ORDER — IOHEXOL 300 MG/ML  SOLN
INTRAMUSCULAR | Status: DC | PRN
  Administered 2015-07-02: 45 mL via INTRA_ARTERIAL

## 2015-07-02 MED ORDER — MIDAZOLAM HCL 2 MG/2ML IJ SOLN
INTRAMUSCULAR | Status: AC
  Filled 2015-07-02: qty 2

## 2015-07-02 MED ORDER — HEPARIN (PORCINE) IN NACL 2-0.9 UNIT/ML-% IJ SOLN
INTRAMUSCULAR | Status: AC
  Filled 2015-07-02: qty 500

## 2015-07-02 MED ORDER — SODIUM CHLORIDE 0.9 % IV SOLN: 250.0000 mL | INTRAVENOUS | Status: DC | PRN

## 2015-07-02 MED ORDER — HEPARIN SODIUM (PORCINE) 1000 UNIT/ML IJ SOLN
INTRAMUSCULAR | Status: DC | PRN
  Administered 2015-07-02: 3500 [IU] via INTRAVENOUS

## 2015-07-02 MED ORDER — SODIUM CHLORIDE 0.9 % IV SOLN
INTRAVENOUS | Status: DC
  Administered 2015-07-02: 08:00:00 via INTRAVENOUS

## 2015-07-02 MED ORDER — VERAPAMIL HCL 2.5 MG/ML IV SOLN
INTRAVENOUS | Status: AC
  Filled 2015-07-02: qty 2

## 2015-07-02 MED ORDER — FENTANYL CITRATE (PF) 100 MCG/2ML IJ SOLN
INTRAMUSCULAR | Status: DC | PRN
  Administered 2015-07-02 (×2): 25 ug via INTRAVENOUS

## 2015-07-02 MED ORDER — FENTANYL CITRATE (PF) 100 MCG/2ML IJ SOLN
INTRAMUSCULAR | Status: AC
  Filled 2015-07-02: qty 2

## 2015-07-02 MED ORDER — HEPARIN SODIUM (PORCINE) 1000 UNIT/ML IJ SOLN
INTRAMUSCULAR | Status: AC
  Filled 2015-07-02: qty 1

## 2015-07-02 SURGICAL SUPPLY — 6 items
CATH OPTITORQUE JACKY 4.0 5F (CATHETERS) ×3 IMPLANT
DEVICE RAD TR BAND REGULAR (VASCULAR PRODUCTS) ×3 IMPLANT
GLIDESHEATH SLEND SS 6F .021 (SHEATH) ×3 IMPLANT
KIT MANI 3VAL PERCEP (MISCELLANEOUS) ×3 IMPLANT
PACK CARDIAC CATH (CUSTOM PROCEDURE TRAY) ×3 IMPLANT
WIRE SAFE-T 1.5MM-J .035X260CM (WIRE) ×3 IMPLANT

## 2015-07-02 NOTE — H&P (View-Only) (Signed)
Cardiology Office Note   Date:  06/10/2015   ID:  Kevin Roberts, DOB 1945-12-02, MRN [id number]  PCP:  Loistine Chance, MD  Cardiologist:   [name], MD   Chief Complaint  Patient presents with  . OTHER    No complaints. Meds reviewed verbally with pt.      History of Present Illness: Kevin Roberts is a 70 y.o. male who was referred by Dr.Sowles for evaluation of coronary atherosclerosis noted on CT scan. The patient has no previous cardiac history. However, he has extensive risk factors including hypertension, hyperlipidemia and prolonged history of tobacco use. He smokes one and a half pack per day. He also has COPD. Abdominal aortic ultrasound showed no evidence of aortic aneurysm in 2016. He recently had CT scan of the lungs for lung cancer screening which showed evidence of coronary atherosclerosis. The patient denies any chest pain, orthopnea, PND or lower extremity edema. He has no leg claudication. He does complain of significant exertional dyspnea thought to be due to his known COPD and continued smoking. No previous cardiac history. He has no family history of premature coronary artery disease.    Past Medical History  Diagnosis Date  . GERD (gastroesophageal reflux disease)   . Hypertension   . Hyperlipidemia   . COPD (chronic obstructive pulmonary disease) ([place])   . Chronic kidney disease, stage III (moderate)   . History of hypercalcemia   . Elevated ferritin   . Benign prostatic hypertrophy   . Peripheral polyneuropathy ([place])   . Tobacco use   . Vitamin B 12 deficiency   . Cramps, muscle, general   . Nocturia   . Urgency of micturation   . Incomplete bladder emptying   . Rising PSA level   . CAD (coronary atherosclerotic disease)     Past Surgical History  Procedure Laterality Date  . Throat biopsy      lump removed     Current Outpatient Prescriptions  Medication Sig Dispense Refill  . aspirin 81 MG tablet Take 1 tablet by mouth daily.      Marland Kitchen atenolol (TENORMIN) 25 MG tablet Take 1 tablet (25 mg total) by mouth daily. 90 tablet 1  . atorvastatin (LIPITOR) 40 MG tablet Take 1 tablet (40 mg total) by mouth daily. 90 tablet 1  . Cyanocobalamin (B-12) 1000 MCG TBCR Take 1 tablet by mouth daily.    Marland Kitchen gabapentin (NEURONTIN) 300 MG capsule Take 1 capsule (300 mg total) by mouth 3 (three) times daily. (Patient taking differently: Take 300 mg by mouth 2 (two) times daily. ) 270 capsule 1  . lansoprazole (PREVACID) 30 MG capsule Take 1 capsule (30 mg total) by mouth daily. 90 capsule 1  . MULTIPLE VITAMIN PO Take 1 tablet by mouth daily.    . potassium chloride SA (KLOR-CON M20) 20 MEQ tablet Take 1 tablet (20 mEq total) by mouth daily. 90 tablet 1  . SPIRIVA HANDIHALER 18 MCG inhalation capsule INHALE ONE DOSE BY MOUTH ONCE DAILY FOR COPD. 90 capsule 0  . tamsulosin (FLOMAX) 0.4 MG CAPS capsule Take 1 capsule by mouth daily.    Marland Kitchen triamterene-hydrochlorothiazide (MAXZIDE-25) 37.5-25 MG tablet TAKE ONE-HALF TABLET BY MOUTH ONCE DAILY 45 tablet 5  . Vitamin D, Cholecalciferol, 1000 UNITS CAPS Take 1 tablet by mouth daily.     No current facility-administered medications for this visit.    Allergies:   Review of patient's allergies indicates no known allergies.    Social History:  The patient  reports that he has been smoking Cigarettes.  He started smoking about 41 years ago. He has a 41 pack-year smoking history. He has never used smokeless tobacco. He reports that he does not drink alcohol or use illicit drugs.   Family History:  The patient's family history includes CVA in his mother; Cancer in his father; GER disease in his son; Heart Problems in his brother; Hypertension in his mother; Mental illness in his brother.    ROS:  Please see the history of present illness.   Otherwise, review of systems are positive for none.   All other systems are reviewed and negative.    PHYSICAL EXAM: VS:  BP 108/64 mmHg  Pulse 64  Ht 5\' 11"   (1.803 m)  Wt 157 lb 12 oz (71.555 kg)  BMI 22.01 kg/m2 , BMI Body mass index is 22.01 kg/(m^2). GEN: Well nourished, well developed, in no acute distress HEENT: normal Neck: no JVD, carotid bruits, or masses Cardiac: RRR; no murmurs, rubs, or gallops,no edema  Respiratory:  clear to auscultation bilaterally, normal work of breathing GI: soft, nontender, nondistended, + BS MS: no deformity or atrophy Skin: warm and dry, no rash Neuro:  Strength and sensation are intact Psych: euthymic mood, full affect   EKG:  EKG is ordered today. The ekg ordered today demonstrates sinus rhythm with first degree AV block, minor anterior ST elevation consistent with early repolarization.   Recent Labs: 12/03/2014: ALT 21; BUN 12; Creatinine, Ser 1.31*; Potassium 4.4; Sodium 139    Lipid Panel    Component Value Date/Time   CHOL 92* 12/03/2014 1216   CHOL 98 03/02/2014   TRIG 48 12/03/2014 1216   HDL 49 12/03/2014 1216   HDL 51 03/02/2014   CHOLHDL 1.9 12/03/2014 1216   LDLCALC 33 12/03/2014 1216   LDLCALC 36 03/02/2014      Wt Readings from Last 3 Encounters:  06/10/15 157 lb 12 oz (71.555 kg)  04/21/15 157 lb (71.215 kg)  04/15/15 157 lb 6.4 oz (71.396 kg)         ASSESSMENT AND PLAN:  1.  Coronary atherosclerosis: This was noted on CT scan of the lungs. The patient has multiple risk factors for coronary artery disease. ECG is slightly abnormal but the ST changes are consistent with early repolarization. I requested a treadmill nuclear stress test to ensure no obstructive coronary artery disease. I discussed with the patient the importance of lifestyle changes in order to decrease the chance of future coronary artery disease and cardiovascular events. We discussed the importance of controlling risk factors, healthy diet as well as regular exercise.  2. Hyperlipidemia: I agree with treatment with atorvastatin given his future risk of cardiovascular disease.  3. Tobacco use: I had  a prolonged discussion with him about the importance of smoking cessation.  4.  Essential hypertension: Blood pressure is controlled on current medications.      Disposition:   FU with me as needed if stress test is abnormal.  Signed, [name], MD  06/10/2015 12:57 PM    [place]

## 2015-07-02 NOTE — Interval H&P Note (Signed)
History and Physical Interval Note:  07/02/2015 7:43 AM  Kevin Roberts  has presented today for surgery, with the diagnosis of Lt Heart Abn Stress Test  The various methods of treatment have been discussed with the patient and family. After consideration of risks, benefits and other options for treatment, the patient has consented to  Procedure(s): Left Heart Cath and Coronary Angiography (N/A) as a surgical intervention .  The patient's history has been reviewed, patient examined, no change in status, stable for surgery.  I have reviewed the patient's chart and labs.  Questions were answered to the patient's satisfaction.     [name]

## 2015-07-05 ENCOUNTER — Encounter: Payer: Self-pay | Admitting: Cardiovascular Disease

## 2015-07-12 ENCOUNTER — Encounter (INDEPENDENT_AMBULATORY_CARE_PROVIDER_SITE_OTHER): Payer: Self-pay

## 2015-07-12 ENCOUNTER — Other Ambulatory Visit: Payer: Self-pay

## 2015-07-12 ENCOUNTER — Encounter: Payer: Self-pay | Admitting: Cardiovascular Disease

## 2015-07-12 ENCOUNTER — Ambulatory Visit (INDEPENDENT_AMBULATORY_CARE_PROVIDER_SITE_OTHER): Payer: Medicare Other | Admitting: Cardiovascular Disease

## 2015-07-12 VITALS — BP 124/66 | HR 87 | Ht 71.0 in | Wt 158.2 lb

## 2015-07-12 DIAGNOSIS — I10 Essential (primary) hypertension: Secondary | ICD-10-CM

## 2015-07-12 DIAGNOSIS — I2510 Atherosclerotic heart disease of native coronary artery without angina pectoris: Secondary | ICD-10-CM | POA: Diagnosis not present

## 2015-07-12 MED ORDER — POTASSIUM CHLORIDE CRYS ER 20 MEQ PO TBCR: 20.0000 meq | EXTENDED_RELEASE_TABLET | Freq: Every day | ORAL | Status: DC

## 2015-07-12 NOTE — Telephone Encounter (Signed)
Got a fax from [place] requesting a refill of this patient's potassium CL ER  Refill request was sent to Dr. Steele Sizer for approval and submission.

## 2015-07-12 NOTE — Patient Instructions (Signed)
Medication Instructions:  Your physician recommends that you continue on your current medications as directed. Please refer to the Current Medication list given to you today.   Labwork: none  Testing/Procedures: none  Follow-Up: Your physician wants you to follow-up in: one year with Dr. Fletcher Anon.  You will receive a reminder letter in the mail two months in advance. If you don't receive a letter, please call our office to schedule the follow-up appointment.   Any Other Special Instructions Will Be Listed Below (If Applicable).     If you need a refill on your cardiac medications before your next appointment, please call your pharmacy.  You Can Quit Smoking If you are ready to quit smoking or are thinking about it, congratulations! You have chosen to help yourself be healthier and live longer! There are lots of different ways to quit smoking. Nicotine gum, nicotine patches, a nicotine inhaler, or nicotine nasal spray can help with physical craving. Hypnosis, support groups, and medicines help break the habit of smoking. TIPS TO GET OFF AND STAY OFF CIGARETTES  Learn to predict your moods. Do not let a bad situation be your excuse to have a cigarette. Some situations in your life might tempt you to have a cigarette.  Ask friends and co-workers not to smoke around you.  Make your home smoke-free.  Never have "just one" cigarette. It leads to wanting another and another. Remind yourself of your decision to quit.  On a card, make a list of your reasons for not smoking. Read it at least the same number of times a day as you have a cigarette. Tell yourself everyday, "I do not want to smoke. I choose not to smoke."  Ask someone at home or work to help you with your plan to quit smoking.  Have something planned after you eat or have a cup of coffee. Take a walk or get other exercise to perk you up. This will help to keep you from overeating.  Try a relaxation exercise to calm you down and  decrease your stress. Remember, you may be tense and nervous the first two weeks after you quit. This will pass.  Find new activities to keep your hands busy. Play with a pen, coin, or rubber band. Doodle or draw things on paper.  Brush your teeth right after eating. This will help cut down the craving for the taste of tobacco after meals. You can try mouthwash too.  Try gum, breath mints, or diet candy to keep something in your mouth. IF YOU SMOKE AND WANT TO QUIT:  Do not stock up on cigarettes. Never buy a carton. Wait until one pack is finished before you buy another.  Never carry cigarettes with you at work or at home.  Keep cigarettes as far away from you as possible. Leave them with someone else.  Never carry matches or a lighter with you.  Ask yourself, "Do I need this cigarette or is this just a reflex?"  Bet with someone that you can quit. Put cigarette money in a piggy bank every morning. If you smoke, you give up the money. If you do not smoke, by the end of the week, you keep the money.  Keep trying. It takes 21 days to change a habit!  Talk to your doctor about using medicines to help you quit. These include nicotine replacement gum, lozenges, or skin patches.   This information is not intended to replace advice given to you by your health care provider.  Make sure you discuss any questions you have with your health care provider.   Document Released: 01/21/2009 Document Revised: 06/19/2011 Document Reviewed: 01/21/2009 Elsevier Interactive Patient Education [employer].

## 2015-07-12 NOTE — Progress Notes (Signed)
Cardiology Office Note   Date:  07/12/2015   ID:  AYDYN LIZZA, DOB 14-Jun-1945, MRN [id number]  PCP:  Loistine Chance, MD  Cardiologist:   [name], MD   Chief Complaint  Patient presents with  . other    Follow up from Cardiac cath. Meds reviewed by the patient verbally. "doing well."       History of Present Illness: Kevin Roberts is a 70 y.o. male who Is here today for a follow-up visit after recent cardiac catheterization. He was seen recently for coronary atherosclerosis noted on CT scan.He has extensive risk factors including hypertension, hyperlipidemia and prolonged history of tobacco use. He smokes one and a half pack per day. He also has COPD.  His symptoms included significant exertional dyspnea without chest pain. He was evaluated with a nuclear stress test which was overall very suboptimal due to intense GI uptake and was suggestive of ischemia in the LAD distribution. I proceeded with cardiac catheterization which showed mild to moderate nonobstructive coronary artery disease with overall moderately to heavily calcified arteries especially the LAD. Ejection fraction was normal as well as left ventricular end-diastolic pressure.   Past Medical History  Diagnosis Date  . GERD (gastroesophageal reflux disease)   . Hypertension   . Hyperlipidemia   . COPD (chronic obstructive pulmonary disease) ([place])   . Chronic kidney disease, stage III (moderate)   . History of hypercalcemia   . Elevated ferritin   . Benign prostatic hypertrophy   . Peripheral polyneuropathy ([place])   . Tobacco use   . Vitamin B 12 deficiency   . Cramps, muscle, general   . Nocturia   . Urgency of micturation   . Incomplete bladder emptying   . Rising PSA level   . CAD (coronary atherosclerotic disease)     Past Surgical History  Procedure Laterality Date  . Throat biopsy      lump removed  . Cardiac catheterization N/A 07/02/2015    Procedure: Left Heart Cath and Coronary  Angiography;  Surgeon: Wellington Hampshire, MD;  Location: [facility];  Service: Cardiovascular;  Laterality: N/A;     Current Outpatient Prescriptions  Medication Sig Dispense Refill  . aspirin 81 MG tablet Take 1 tablet by mouth daily.    Marland Kitchen atenolol (TENORMIN) 25 MG tablet Take 1 tablet (25 mg total) by mouth daily. 90 tablet 1  . atorvastatin (LIPITOR) 40 MG tablet Take 1 tablet (40 mg total) by mouth daily. 90 tablet 1  . Cyanocobalamin (B-12) 1000 MCG TBCR Take 1 tablet by mouth daily.    Marland Kitchen gabapentin (NEURONTIN) 300 MG capsule Take 1 capsule (300 mg total) by mouth 3 (three) times daily. (Patient taking differently: Take 300 mg by mouth 2 (two) times daily. ) 270 capsule 1  . lansoprazole (PREVACID) 30 MG capsule Take 1 capsule (30 mg total) by mouth daily. 90 capsule 1  . MULTIPLE VITAMIN PO Take 1 tablet by mouth daily.    . potassium chloride SA (KLOR-CON M20) 20 MEQ tablet Take 1 tablet (20 mEq total) by mouth daily. 90 tablet 1  . tamsulosin (FLOMAX) 0.4 MG CAPS capsule Take 1 capsule by mouth daily.    Marland Kitchen tiotropium (SPIRIVA HANDIHALER) 18 MCG inhalation capsule INHALE ONE DOSE BY MOUTH ONCE DAILY FOR COPD. 30 capsule 5  . triamterene-hydrochlorothiazide (MAXZIDE-25) 37.5-25 MG tablet TAKE ONE-HALF TABLET BY MOUTH ONCE DAILY 45 tablet 5  . Vitamin D, Cholecalciferol, 1000 UNITS CAPS Take 1  tablet by mouth daily.     No current facility-administered medications for this visit.    Allergies:   Review of patient's allergies indicates no known allergies.    Social History:  The patient  reports that he has been smoking Cigarettes.  He started smoking about 41 years ago. He has a 41 pack-year smoking history. He has never used smokeless tobacco. He reports that he does not drink alcohol or use illicit drugs.   Family History:  The patient's family history includes CVA in his mother; Cancer in his father; GER disease in his son; Heart Problems in his brother; Hypertension in  his mother; Mental illness in his brother.    ROS:  Please see the history of present illness.   Otherwise, review of systems are positive for none.   All other systems are reviewed and negative.    PHYSICAL EXAM: VS:  BP 124/66 mmHg  Pulse 87  Ht 5\' 11"  (1.803 m)  Wt 158 lb 4 oz (71.782 kg)  BMI 22.08 kg/m2  SpO2 98% , BMI Body mass index is 22.08 kg/(m^2). GEN: Well nourished, well developed, in no acute distress HEENT: normal Neck: no JVD, carotid bruits, or masses Cardiac: RRR; no murmurs, rubs, or gallops,no edema  Respiratory:  clear to auscultation bilaterally, normal work of breathing GI: soft, nontender, nondistended, + BS MS: no deformity or atrophy Skin: warm and dry, no rash Neuro:  Strength and sensation are intact Psych: euthymic mood, full affect Right radial pulse is mildly diminished with no hematoma.  EKG:  EKG is not ordered today.    Recent Labs: 12/03/2014: ALT 21 06/28/2015: BUN 15; Creatinine, Ser 1.51*; Hemoglobin 15.6; Platelets 163; Potassium 3.7; Sodium 137    Lipid Panel    Component Value Date/Time   CHOL 92* 12/03/2014 1216   CHOL 98 03/02/2014   TRIG 48 12/03/2014 1216   HDL 49 12/03/2014 1216   HDL 51 03/02/2014   CHOLHDL 1.9 12/03/2014 1216   LDLCALC 33 12/03/2014 1216   LDLCALC 36 03/02/2014      Wt Readings from Last 3 Encounters:  07/12/15 158 lb 4 oz (71.782 kg)  07/02/15 157 lb (71.215 kg)  06/10/15 157 lb 12 oz (71.555 kg)         ASSESSMENT AND PLAN:  1.  Coronary atherosclerosis: Recent cardiac catheterization showed mild to moderate nonobstructive calcified coronary arteries. I recommend aggressive medical therapy for this. Continue low-dose aspirin and treatment for hyperlipidemia.  2. Hyperlipidemia: Continue atorvastatin with a target LDL of less than 70.  3. Tobacco use: I again discussed with him the importance of smoking cessation   4.  Essential hypertension: Blood pressure is controlled on current  medications.      Disposition:   FU with me in one year   Signed,  [name], MD  07/12/2015 6:58 PM    [place]

## 2015-07-19 ENCOUNTER — Other Ambulatory Visit: Payer: Self-pay

## 2015-07-19 DIAGNOSIS — E785 Hyperlipidemia, unspecified: Secondary | ICD-10-CM

## 2015-07-19 NOTE — Telephone Encounter (Signed)
Patient requesting refill. 

## 2015-07-20 MED ORDER — ATORVASTATIN CALCIUM 40 MG PO TABS: 40.0000 mg | ORAL_TABLET | Freq: Every day | ORAL | Status: DC

## 2015-08-11 ENCOUNTER — Other Ambulatory Visit: Payer: Self-pay

## 2015-08-11 DIAGNOSIS — K219 Gastro-esophageal reflux disease without esophagitis: Secondary | ICD-10-CM

## 2015-08-11 MED ORDER — TAMSULOSIN HCL 0.4 MG PO CAPS: 0.4000 mg | ORAL_CAPSULE | Freq: Every day | ORAL | Status: DC

## 2015-08-11 MED ORDER — LANSOPRAZOLE 30 MG PO CPDR: 30.0000 mg | DELAYED_RELEASE_CAPSULE | Freq: Every day | ORAL | Status: DC

## 2015-08-11 NOTE — Telephone Encounter (Signed)
Patient requesting refill. 

## 2015-10-14 ENCOUNTER — Ambulatory Visit (INDEPENDENT_AMBULATORY_CARE_PROVIDER_SITE_OTHER): Payer: Medicare Other | Admitting: Family Medicine

## 2015-10-14 ENCOUNTER — Encounter: Payer: Self-pay | Admitting: Family Medicine

## 2015-10-14 VITALS — BP 108/68 | HR 66 | Temp 98.3°F | Resp 16 | Wt 155.8 lb

## 2015-10-14 DIAGNOSIS — J449 Chronic obstructive pulmonary disease, unspecified: Secondary | ICD-10-CM | POA: Diagnosis not present

## 2015-10-14 DIAGNOSIS — E785 Hyperlipidemia, unspecified: Secondary | ICD-10-CM

## 2015-10-14 DIAGNOSIS — I700 Atherosclerosis of aorta: Secondary | ICD-10-CM

## 2015-10-14 DIAGNOSIS — K219 Gastro-esophageal reflux disease without esophagitis: Secondary | ICD-10-CM | POA: Diagnosis not present

## 2015-10-14 DIAGNOSIS — N62 Hypertrophy of breast: Secondary | ICD-10-CM | POA: Diagnosis not present

## 2015-10-14 DIAGNOSIS — Z23 Encounter for immunization: Secondary | ICD-10-CM

## 2015-10-14 DIAGNOSIS — I10 Essential (primary) hypertension: Secondary | ICD-10-CM

## 2015-10-14 DIAGNOSIS — N183 Chronic kidney disease, stage 3 unspecified: Secondary | ICD-10-CM

## 2015-10-14 DIAGNOSIS — I2510 Atherosclerotic heart disease of native coronary artery without angina pectoris: Secondary | ICD-10-CM

## 2015-10-14 DIAGNOSIS — E538 Deficiency of other specified B group vitamins: Secondary | ICD-10-CM

## 2015-10-14 DIAGNOSIS — G63 Polyneuropathy in diseases classified elsewhere: Secondary | ICD-10-CM

## 2015-10-14 LAB — COMPREHENSIVE METABOLIC PANEL
ALK PHOS: 53 U/L (ref 40–115)
ALT: 17 U/L (ref 9–46)
AST: 24 U/L (ref 10–35)
Albumin: 4.4 g/dL (ref 3.6–5.1)
BUN: 16 mg/dL (ref 7–25)
CALCIUM: 9.5 mg/dL (ref 8.6–10.3)
CO2: 27 mmol/L (ref 20–31)
Chloride: 104 mmol/L (ref 98–110)
Creat: 1.42 mg/dL — ABNORMAL HIGH (ref 0.70–1.25)
GLUCOSE: 87 mg/dL (ref 65–99)
POTASSIUM: 4.3 mmol/L (ref 3.5–5.3)
Sodium: 139 mmol/L (ref 135–146)
Total Bilirubin: 1.2 mg/dL (ref 0.2–1.2)
Total Protein: 6.9 g/dL (ref 6.1–8.1)

## 2015-10-14 LAB — LIPID PANEL
CHOL/HDL RATIO: 1.9 ratio (ref <=5.0)
CHOLESTEROL: 101 mg/dL — AB (ref 125–200)
HDL: 52 mg/dL (ref >=40)
LDL CALC: 38 mg/dL (ref <130)
TRIGLYCERIDES: 55 mg/dL (ref <150)
VLDL: 11 mg/dL (ref <30)

## 2015-10-14 MED ORDER — METOPROLOL SUCCINATE ER 25 MG PO TB24
25.0000 mg | ORAL_TABLET | Freq: Every day | ORAL | Status: DC
Start: 2015-10-14 — End: 2016-01-18

## 2015-10-14 MED ORDER — GABAPENTIN 300 MG PO CAPS: 300.0000 mg | ORAL_CAPSULE | Freq: Two times a day (BID) | ORAL | Status: DC

## 2015-10-14 NOTE — Progress Notes (Signed)
Name: Kevin Roberts   MRN: [id number]    DOB: 02/20/1946   Date:10/14/2015       Progress Note  Subjective  Chief Complaint  Chief Complaint  Patient presents with  . Medication Refill    6 month F/U.   Marland Kitchen Chronic Kidney Disease  . COPD  . Hypertension  . Coronary Artery Disease  . Dyslipidemia  . Atherosclerosis  . B12 neruopathy  . Hypercalcemia    HPI   COPD Mild: he is taking Spiriva, cough is usually dry, he has decrease in exercise tolerance, he states he feels lazy and does not move around. No wheezing, denies nocturnal cough.  HTN: taking medication, he denies dizziness, but feels tired, no chest pain, occasionally has palpitation when he is upset.  GERD: symptoms are under control as long as he takes Lanzoprazole, he states when he skips medications for one day he develops heartburn , epigastric pain and has regurgitation.  Hyperlipidemia: taking Atorvastatin and denies side effects. Taking aspirin daily . Denies myalgia. Due for labs  CAD: found on CT chest 3 vessel disease he denies chest pain , but he has decrease in exercise tolerance, it may be multifactorial from COPD also. He was seen by Dr. Fletcher Anon and advised to quit smoking. On aspirin, statins and beta-blocker. Cardiac cath in 2017 showed mild to moderate non-obstructive disease.  B12 neuropathy: he is taking B12 supplementation and also takes Neurontin - symptoms are stable  Tobacco Abuse: he has been a smoker for 41 years, he is up to date with triple A screening, negative in 2016, chest CT done in 11/2013 and repeat CT done  04/21/2015. Explained importance of quitting smoking   Atherosclerosis abdominal aorta: on medical management, aspirin, beta-blocker and statin therapy. Needs to quit smoking   Patient Active Problem List   Diagnosis Date Noted  . Abnormal nuclear stress test   . Anginal equivalent ([place])   . Atherosclerosis of aorta ([place]) 04/15/2015  . BPH with obstruction/lower urinary tract  symptoms 02/03/2015  . Renal cyst, left 10/13/2014  . Tobacco use   . B12 neuropathy 10/04/2014  . CAD, multiple vessel 10/04/2014  . Benign essential HTN 10/04/2014  . Chronic kidney disease (CKD), stage III (moderate) 10/04/2014  . Dyslipidemia 10/04/2014  . COPD, mild ([place]) 10/04/2014  . Gastro-esophageal reflux disease without esophagitis 10/04/2014  . Enlarged prostate 10/04/2014  . Vitamin D deficiency 10/04/2014    Past Surgical History  Procedure Laterality Date  . Throat biopsy      lump removed  . Cardiac catheterization N/A 07/02/2015    Procedure: Left Heart Cath and Coronary Angiography;  Surgeon: Wellington Hampshire, MD;  Location: [facility];  Service: Cardiovascular;  Laterality: N/A;    Family History  Problem Relation Age of Onset  . Hypertension Mother   . CVA Mother   . Cancer Father     Lung  . Mental illness Brother     1-Schizophrenia  . GER disease Son   . Heart Problems Brother     Social History   Social History  . Marital Status: Married    Spouse Name: N/A  . Number of Children: N/A  . Years of Education: N/A   Occupational History  . Not on file.   Social History Main Topics  . Smoking status: Current Every Day Smoker -- 1.00 packs/day for 41 years    Types: Cigarettes    Start date: 10/04/1973  . Smokeless tobacco: Never Used  .  Alcohol Use: No  . Drug Use: No  . Sexual Activity: No   Other Topics Concern  . Not on file   Social History Narrative     Current outpatient prescriptions:  .  aspirin 81 MG tablet, Take 1 tablet by mouth daily., Disp: , Rfl:  .  atorvastatin (LIPITOR) 40 MG tablet, Take 1 tablet (40 mg total) by mouth daily., Disp: 90 tablet, Rfl: 1 .  Cyanocobalamin (B-12) 1000 MCG TBCR, Take 1 tablet by mouth daily., Disp: , Rfl:  .  gabapentin (NEURONTIN) 300 MG capsule, Take 1 capsule (300 mg total) by mouth 2 (two) times daily., Disp: 180 capsule, Rfl: 1 .  lansoprazole (PREVACID) 30 MG capsule, Take  1 capsule (30 mg total) by mouth daily., Disp: 90 capsule, Rfl: 2 .  MULTIPLE VITAMIN PO, Take 1 tablet by mouth daily., Disp: , Rfl:  .  tamsulosin (FLOMAX) 0.4 MG CAPS capsule, Take 1 capsule (0.4 mg total) by mouth daily., Disp: 90 capsule, Rfl: 2 .  tiotropium (SPIRIVA HANDIHALER) 18 MCG inhalation capsule, INHALE ONE DOSE BY MOUTH ONCE DAILY FOR COPD., Disp: 30 capsule, Rfl: 5 .  Vitamin D, Cholecalciferol, 1000 UNITS CAPS, Take 1 tablet by mouth daily., Disp: , Rfl:  .  metoprolol succinate (TOPROL-XL) 25 MG 24 hr tablet, Take 1 tablet (25 mg total) by mouth daily., Disp: 90 tablet, Rfl: 1  No Known Allergies   ROS  Constitutional: Negative for fever or weight change.  Respiratory: Positive  for cough and shortness of breath.   Cardiovascular: Negative for chest pain or palpitations.  Gastrointestinal: Negative for abdominal pain, no bowel changes.  Musculoskeletal: Negative for gait problem or joint swelling.  Skin: Negative for rash.   Neurological: Negative for dizziness or headache.  No other specific complaints in a complete review of systems (except as listed in HPI above).  Objective  Filed Vitals:   10/14/15 1034  BP: 108/68  Pulse: 66  Temp: 98.3 F (36.8 C)  TempSrc: Oral  Resp: 16  Weight: 155 lb 12.8 oz (70.67 kg)  SpO2: 98%    Body mass index is 21.74 kg/(m^2).  Physical Exam  Constitutional: Patient appears well-developed and well-nourished.  No distress.  HEENT: head atraumatic, normocephalic, pupils equal and reactive to light,  neck supple, throat within normal limits Cardiovascular: Normal rate, regular rhythm and normal heart sounds.  No murmur heard. No BLE edema. Pulmonary/Chest: Effort normal and breath sounds normal. No respiratory distress. Abdominal: Soft.  There is no tenderness. Psychiatric: Patient has a normal mood and affect. behavior is normal. Judgment and thought content normal.  PHQ2/9: Depression screen [facility] 2/9 10/14/2015 04/15/2015  10/05/2014  Decreased Interest 0 0 0  Down, Depressed, Hopeless 0 0 0  PHQ - 2 Score 0 0 0     Fall Risk: Fall Risk  10/14/2015 04/15/2015 10/05/2014  Falls in the past year? No No No     Functional Status Survey: Is the patient deaf or have difficulty hearing?: No Does the patient have difficulty seeing, even when wearing glasses/contacts?: No Does the patient have difficulty concentrating, remembering, or making decisions?: No Does the patient have difficulty walking or climbing stairs?: No Does the patient have difficulty dressing or bathing?: No Does the patient have difficulty doing errands alone such as visiting a doctor's office or shopping?: No    Assessment & Plan  1. COPD, mild ([place])  Continue Spiriva, needs to quit smoking  2. Benign essential HTN  - Comprehensive metabolic  panel  3. CAD, multiple vessel  Continue follow up with Dr. Fletcher Anon  4. Dyslipidemia  - Lipid panel - Comprehensive metabolic panel  5. Atherosclerosis of aorta ([facility])  - Lipid panel - Comprehensive metabolic panel  6. B12 neuropathy  - gabapentin (NEURONTIN) 300 MG capsule; Take 1 capsule (300 mg total) by mouth 2 (two) times daily.  Dispense: 180 capsule; Refill: 1  7. Gastro-esophageal reflux disease without esophagitis  Continue medical therapy   8. Chronic kidney disease (CKD), stage III (moderate)  Recheck labs  9. Gynecomastia  Found on CT chest - normal exam, fatty deposits  10. Need for pneumococcal vaccination  - Pneumococcal polysaccharide vaccine 23-valent greater than or equal to 2yo subcutaneous/IM

## 2016-01-03 ENCOUNTER — Other Ambulatory Visit: Payer: Self-pay

## 2016-01-03 MED ORDER — TIOTROPIUM BROMIDE MONOHYDRATE 18 MCG IN CAPS: ORAL_CAPSULE | RESPIRATORY_TRACT | 5 refills | Status: DC

## 2016-01-03 NOTE — Telephone Encounter (Signed)
Patient requesting refill of Spiriva to [place].

## 2016-01-18 ENCOUNTER — Ambulatory Visit (INDEPENDENT_AMBULATORY_CARE_PROVIDER_SITE_OTHER): Payer: Medicare Other | Admitting: Family Medicine

## 2016-01-18 ENCOUNTER — Encounter: Payer: Self-pay | Admitting: Family Medicine

## 2016-01-18 VITALS — BP 118/64 | HR 70 | Temp 98.0°F | Resp 16 | Ht 71.0 in | Wt 158.0 lb

## 2016-01-18 DIAGNOSIS — J449 Chronic obstructive pulmonary disease, unspecified: Secondary | ICD-10-CM

## 2016-01-18 DIAGNOSIS — I208 Other forms of angina pectoris: Secondary | ICD-10-CM

## 2016-01-18 DIAGNOSIS — E538 Deficiency of other specified B group vitamins: Secondary | ICD-10-CM | POA: Diagnosis not present

## 2016-01-18 DIAGNOSIS — I10 Essential (primary) hypertension: Secondary | ICD-10-CM | POA: Diagnosis not present

## 2016-01-18 DIAGNOSIS — I700 Atherosclerosis of aorta: Secondary | ICD-10-CM | POA: Diagnosis not present

## 2016-01-18 DIAGNOSIS — E785 Hyperlipidemia, unspecified: Secondary | ICD-10-CM | POA: Diagnosis not present

## 2016-01-18 DIAGNOSIS — I209 Angina pectoris, unspecified: Secondary | ICD-10-CM | POA: Diagnosis not present

## 2016-01-18 DIAGNOSIS — I2510 Atherosclerotic heart disease of native coronary artery without angina pectoris: Secondary | ICD-10-CM

## 2016-01-18 DIAGNOSIS — G63 Polyneuropathy in diseases classified elsewhere: Secondary | ICD-10-CM

## 2016-01-18 DIAGNOSIS — F1021 Alcohol dependence, in remission: Secondary | ICD-10-CM | POA: Diagnosis not present

## 2016-01-18 DIAGNOSIS — N183 Chronic kidney disease, stage 3 unspecified: Secondary | ICD-10-CM

## 2016-01-18 DIAGNOSIS — Z23 Encounter for immunization: Secondary | ICD-10-CM

## 2016-01-18 MED ORDER — METOPROLOL SUCCINATE ER 25 MG PO TB24: 25.0000 mg | ORAL_TABLET | Freq: Every day | ORAL | 1 refills | Status: DC

## 2016-01-18 MED ORDER — ATORVASTATIN CALCIUM 40 MG PO TABS: 40.0000 mg | ORAL_TABLET | Freq: Every day | ORAL | 1 refills | Status: DC

## 2016-01-18 NOTE — Progress Notes (Signed)
Name: Kevin Roberts   MRN: [id number]    DOB: 14-Feb-1946   Date:01/18/2016       Progress Note  Subjective  Chief Complaint  Chief Complaint  Patient presents with  . COPD  . Hypertension  . Chronic Kidney Disease  . Flu Vaccine    HPI  COPD Mild: he is taking Spiriva, cough is usually dry, he denies decrease in exercise tolerance. No wheezing, denies nocturnal cough.  History of Alcoholism: he used to drink before retirement - about 2 shots at night, however when he retired at age 40 he started to drink about 1/5 of liquor every two days. He states he stopped drinking after two years because he fell on his face, developed a gastric ulcer and B12 deficiency. He is doing well since he stopped drinking.   HTN: taking medication, he denies dizziness, but feels tired, no chest pain, occasionally has palpitation when he is upset  GERD: symptoms are under control as long as he takes Lanzoprazole, he states when he skips medications for one day he develops heartburn , epigastric pain and has regurgitation.  Hyperlipidemia: taking Atorvastatin and denies side effects. Taking aspirin daily . Denies myalgia.   CAD: found on CT chest 3 vessel disease he denies chest pain ,it may be multifactorial from COPD also. He was seen by Dr. Fletcher Anon and advised to quit smoking. On aspirin, statins and beta-blocker. Cardiac cath in 2017 showed mild to moderate non-obstructive disease and is only on medical management.   B12 neuropathy: he is taking B12 supplementation and also takes Neurontin - symptoms are stable, he has occasional flares  Tobacco Abuse: he has been a smoker for 41 years, he is up to date with triple A screening, negative in 2016, chest CT done in 11/2013 and repeat CT done  04/21/2015. Explained importance of quitting smoking   Atherosclerosis abdominal aorta: on medical management, aspirin, beta-blocker and statin therapy. Needs to quit smoking   Patient Active Problem List   Diagnosis Date Noted  . History of alcoholism ([place]) 01/18/2016  . Gynecomastia 10/14/2015  . Abnormal nuclear stress test   . Anginal equivalent ([place])   . Atherosclerosis of aorta ([place]) 04/15/2015  . BPH with obstruction/lower urinary tract symptoms 02/03/2015  . Renal cyst, left 10/13/2014  . Tobacco use   . B12 neuropathy ([place]) 10/04/2014  . CAD, multiple vessel 10/04/2014  . Benign essential HTN 10/04/2014  . Chronic kidney disease (CKD), stage III (moderate) 10/04/2014  . Dyslipidemia 10/04/2014  . COPD, mild ([place]) 10/04/2014  . Gastro-esophageal reflux disease without esophagitis 10/04/2014  . Enlarged prostate 10/04/2014  . Vitamin D deficiency 10/04/2014    Past Surgical History:  Procedure Laterality Date  . CARDIAC CATHETERIZATION N/A 07/02/2015   Procedure: Left Heart Cath and Coronary Angiography;  Surgeon: Wellington Hampshire, MD;  Location: [place] [facility];  Service: Cardiovascular;  Laterality: N/A;  . throat biopsy     lump removed    Family History  Problem Relation Age of Onset  . Hypertension Mother   . CVA Mother   . Cancer Father     Lung  . Mental illness Brother     1-Schizophrenia  . GER disease Son   . Heart Problems Brother     Social History   Social History  . Marital status: Married    Spouse name: N/A  . Number of children: N/A  . Years of education: N/A   Occupational History  . Not on file.  Social History Main Topics  . Smoking status: Current Every Day Smoker    Packs/day: 1.00    Years: 41.00    Types: Cigarettes    Start date: 10/04/1973  . Smokeless tobacco: Never Used  . Alcohol use No  . Drug use: No  . Sexual activity: No   Other Topics Concern  . Not on file   Social History Narrative  . No narrative on file     Current Outpatient Prescriptions:  .  aspirin 81 MG tablet, Take 1 tablet by mouth daily., Disp: , Rfl:  .  atorvastatin (LIPITOR) 40 MG tablet, Take 1 tablet (40 mg total) by mouth daily., Disp:  90 tablet, Rfl: 1 .  Cyanocobalamin (B-12) 1000 MCG TBCR, Take 1 tablet by mouth daily., Disp: , Rfl:  .  gabapentin (NEURONTIN) 300 MG capsule, Take 1 capsule (300 mg total) by mouth 2 (two) times daily., Disp: 180 capsule, Rfl: 1 .  lansoprazole (PREVACID) 30 MG capsule, Take 1 capsule (30 mg total) by mouth daily., Disp: 90 capsule, Rfl: 2 .  metoprolol succinate (TOPROL-XL) 25 MG 24 hr tablet, Take 1 tablet (25 mg total) by mouth daily., Disp: 90 tablet, Rfl: 1 .  MULTIPLE VITAMIN PO, Take 1 tablet by mouth daily., Disp: , Rfl:  .  tamsulosin (FLOMAX) 0.4 MG CAPS capsule, Take 1 capsule (0.4 mg total) by mouth daily., Disp: 90 capsule, Rfl: 2 .  tiotropium (SPIRIVA HANDIHALER) 18 MCG inhalation capsule, INHALE ONE DOSE BY MOUTH ONCE DAILY FOR COPD., Disp: 30 capsule, Rfl: 5 .  Vitamin D, Cholecalciferol, 1000 UNITS CAPS, Take 1 tablet by mouth daily., Disp: , Rfl:   No Known Allergies   ROS  Constitutional: Negative for fever or weight change.  Respiratory: Positive  for cough, no shortness of breath.   Cardiovascular: Negative for chest pain or palpitations.  Gastrointestinal: Negative for abdominal pain, no bowel changes.  Musculoskeletal: Negative for gait problem or joint swelling.  Skin: Negative for rash.  Neurological: Negative for dizziness or headache.  No other specific complaints in a complete review of systems (except as listed in HPI above).  Objective  Vitals:   01/18/16 1037  BP: 118/64  Pulse: 70  Resp: 16  Temp: 98 F (36.7 C)  SpO2: 93%  Weight: 158 lb (71.7 kg)  Height: 5\' 11"  (1.803 m)    Body mass index is 22.04 kg/m.  Physical Exam  Constitutional: Patient appears well-developed and well-nourished.  No distress.  HEENT: head atraumatic, normocephalic, pupils equal and reactive to light,  neck supple, throat within normal limits Cardiovascular: Normal rate, regular rhythm and normal heart sounds.  No murmur heard. No BLE edema. Pulmonary/Chest:  Effort normal and breath sounds normal. No respiratory distress. Abdominal: Soft.  There is no tenderness. Psychiatric: Patient has a normal mood and affect. behavior is normal. Judgment and thought content normal.  PHQ2/9: Depression screen [facility] 2/9 01/18/2016 10/14/2015 04/15/2015 10/05/2014  Decreased Interest 0 0 0 0  Down, Depressed, Hopeless 0 0 0 0  PHQ - 2 Score 0 0 0 0    Fall Risk: Fall Risk  01/18/2016 10/14/2015 04/15/2015 10/05/2014  Falls in the past year? No No No No    Functional Status Survey: Is the patient deaf or have difficulty hearing?: No Does the patient have difficulty seeing, even when wearing glasses/contacts?: No Does the patient have difficulty concentrating, remembering, or making decisions?: No Does the patient have difficulty walking or climbing stairs?: No Does the patient  have difficulty dressing or bathing?: No Does the patient have difficulty doing errands alone such as visiting a doctor's office or shopping?: No    Assessment & Plan  1. Anginal equivalent [facility])  He states he is doing well at this time  2. Need for influenza vaccination  - Flu vaccine HIGH DOSE PF (Fluzone High dose)  3. History of alcoholism ([place])  Doing well , quit drinking  4. Atherosclerosis of aorta ([place])  On medical management  5. COPD, mild ([place])  Continue Spiriva  6. B12 neuropathy ([facility])  Continue supplementation  7. Chronic kidney disease (CKD), stage III (moderate)  CKI has improved, advised to stay off Advil   8. Dyslipidemia  Continue statin therapy  - atorvastatin (LIPITOR) 40 MG tablet; Take 1 tablet (40 mg total) by mouth daily.  Dispense: 90 tablet; Refill: 1  9. CAD, multiple vessel  Continues aspirin, statin and beta-blocker  10. Benign essential HTN  Well controlled

## 2016-01-26 ENCOUNTER — Other Ambulatory Visit: Payer: Self-pay

## 2016-01-26 DIAGNOSIS — N400 Enlarged prostate without lower urinary tract symptoms: Secondary | ICD-10-CM

## 2016-01-27 ENCOUNTER — Other Ambulatory Visit: Payer: Medicare Other

## 2016-01-27 DIAGNOSIS — N400 Enlarged prostate without lower urinary tract symptoms: Secondary | ICD-10-CM

## 2016-01-28 LAB — PSA: Prostate Specific Ag, Serum: 2.1 ng/mL (ref 0.0–4.0)

## 2016-02-03 ENCOUNTER — Encounter: Payer: Self-pay | Admitting: Urology

## 2016-02-03 ENCOUNTER — Ambulatory Visit (INDEPENDENT_AMBULATORY_CARE_PROVIDER_SITE_OTHER): Payer: Medicare Other | Admitting: Urology

## 2016-02-03 VITALS — BP 124/76 | HR 67 | Ht 71.0 in | Wt 159.4 lb

## 2016-02-03 DIAGNOSIS — N401 Enlarged prostate with lower urinary tract symptoms: Secondary | ICD-10-CM | POA: Diagnosis not present

## 2016-02-03 DIAGNOSIS — I2510 Atherosclerotic heart disease of native coronary artery without angina pectoris: Secondary | ICD-10-CM

## 2016-02-03 DIAGNOSIS — N138 Other obstructive and reflux uropathy: Secondary | ICD-10-CM | POA: Diagnosis not present

## 2016-02-03 NOTE — Progress Notes (Signed)
02/03/2016 9:20 AM   Kevin Roberts 1946-01-21 [id number]  Referring provider: Steele Sizer, MD [address] [place], [place] [address]  Chief Complaint  Patient presents with  . Follow-up    BPH    HPI: Patient is a 70 year old African-American male who presents today for his yearly office visit for BPH with LUTS managed with tamsulosin.  BPH WITH LUTS His IPSS score today is 1, which is mild lower urinary tract symptomatology. He is delighted with his quality life due to his urinary symptoms.  His previous IPSS score was 6/2.  His previous PVR is 27 mL.   He denies any dysuria, hematuria or suprapubic pain.  He currently taking tamsulosin 0.4 mg daily.  He also denies any recent fevers, chills, nausea or vomiting.   He does not have a family history of PCa.      IPSS    Row Name 02/03/16 0800         International Prostate Symptom Score   How often have you had the sensation of not emptying your bladder? Not at All     How often have you had to urinate less than every two hours? Not at All     How often have you found you stopped and started again several times when you urinated? Not at All     How often have you found it difficult to postpone urination? Less than 1 in 5 times     How often have you had a weak urinary stream? Not at All     How often have you had to strain to start urination? Not at All     How many times did you typically get up at night to urinate? None     Total IPSS Score 1       Quality of Life due to urinary symptoms   If you were to spend the rest of your life with your urinary condition just the way it is now how would you feel about that? Delighted        Score:  1-7 Mild 8-19 Moderate 20-35 Severe     PMH: Past Medical History:  Diagnosis Date  . Benign prostatic hypertrophy   . CAD (coronary atherosclerotic disease)   . Chronic kidney disease, stage III (moderate)   . COPD (chronic obstructive pulmonary disease)  ([place])   . Cramps, muscle, general   . Elevated ferritin   . GERD (gastroesophageal reflux disease)   . History of hypercalcemia   . Hyperlipidemia   . Hypertension   . Incomplete bladder emptying   . Nocturia   . Peripheral polyneuropathy ([place])   . Rising PSA level   . Tobacco use   . Urgency of micturation   . Vitamin B 12 deficiency     Surgical History: Past Surgical History:  Procedure Laterality Date  . CARDIAC CATHETERIZATION N/A 07/02/2015   Procedure: Left Heart Cath and Coronary Angiography;  Surgeon: Wellington Hampshire, MD;  Location: [place] [facility];  Service: Cardiovascular;  Laterality: N/A;  . throat biopsy     lump removed    Home Medications:    Medication List       Accurate as of 02/03/16  9:20 AM. Always use your most recent med list.          aspirin 81 MG tablet Take 1 tablet by mouth daily.   atorvastatin 40 MG tablet Commonly known as:  LIPITOR Take 1 tablet (40 mg  total) by mouth daily.   B-12 1000 MCG Tbcr Take 1 tablet by mouth daily.   gabapentin 300 MG capsule Commonly known as:  NEURONTIN Take 1 capsule (300 mg total) by mouth 2 (two) times daily.   lansoprazole 30 MG capsule Commonly known as:  PREVACID Take 1 capsule (30 mg total) by mouth daily.   metoprolol succinate 25 MG 24 hr tablet Commonly known as:  TOPROL-XL Take 1 tablet (25 mg total) by mouth daily.   MULTIPLE VITAMIN PO Take 1 tablet by mouth daily.   tamsulosin 0.4 MG Caps capsule Commonly known as:  FLOMAX Take 1 capsule (0.4 mg total) by mouth daily.   tiotropium 18 MCG inhalation capsule Commonly known as:  SPIRIVA HANDIHALER INHALE ONE DOSE BY MOUTH ONCE DAILY FOR COPD.   Vitamin D (Cholecalciferol) 1000 units Caps Take 1 tablet by mouth daily.       Allergies: No Known Allergies  Family History: Family History  Problem Relation Age of Onset  . Hypertension Mother   . CVA Mother   . Cancer Father     Lung  . Mental illness Brother      1-Schizophrenia  . GER disease Son   . Heart Problems Brother     Social History:  reports that he has been smoking Cigarettes.  He started smoking about 42 years ago. He has a 41.00 pack-year smoking history. He has never used smokeless tobacco. He reports that he does not drink alcohol or use drugs.  ROS: UROLOGY Frequent Urination?: No Hard to postpone urination?: No Burning/pain with urination?: No Get up at night to urinate?: No Leakage of urine?: No Urine stream starts and stops?: No Trouble starting stream?: No Do you have to strain to urinate?: No Blood in urine?: No Urinary tract infection?: No Sexually transmitted disease?: No Injury to kidneys or bladder?: No Painful intercourse?: No Weak stream?: No Erection problems?: No Penile pain?: No  Gastrointestinal Nausea?: No Vomiting?: No Indigestion/heartburn?: No Diarrhea?: No Constipation?: No  Constitutional Fever: No Night sweats?: No Weight loss?: No Fatigue?: No  Skin Skin rash/lesions?: No Itching?: No  Eyes Blurred vision?: No Double vision?: No  Ears/Nose/Throat Sore throat?: No Sinus problems?: No  Hematologic/Lymphatic Swollen glands?: No Easy bruising?: No  Cardiovascular Leg swelling?: No Chest pain?: No  Respiratory Cough?: No Shortness of breath?: No  Endocrine Excessive thirst?: No  Musculoskeletal Back pain?: No Joint pain?: No  Neurological Headaches?: No Dizziness?: No  Psychologic Depression?: No Anxiety?: No  Physical Exam: Ht 5\' 11"  (1.803 m)   Wt 159 lb 6.4 oz (72.3 kg)   BMI 22.23 kg/m   Constitutional: Well nourished. Alert and oriented, No acute distress. HEENT: [place] AT, moist mucus membranes. Trachea midline, no masses. Cardiovascular: No clubbing, cyanosis, or edema. Respiratory: Normal respiratory effort, no increased work of breathing. GI: Abdomen is soft, non tender, non distended, no abdominal masses. Liver and spleen not palpable.  No hernias  appreciated.  Stool sample for occult testing is not indicated.   GU: No CVA tenderness.  No bladder fullness or masses.  Patient with circumcised phallus.  Urethral meatus is patent.  No penile discharge. No penile lesions or rashes. Scrotum without lesions, cysts, rashes and/or edema.  Testicles are located scrotally bilaterally. No masses are appreciated in the testicles. Left and right epididymis are normal. Rectal: Patient with  normal sphincter tone. Anus and perineum without scarring or rashes. No rectal masses are appreciated. Prostate is approximately 55 grams, no nodules are  appreciated. Seminal vesicles are normal. Skin: No rashes, bruises or suspicious lesions. Lymph: No cervical or inguinal adenopathy. Neurologic: Grossly intact, no focal deficits, moving all 4 extremities. Psychiatric: Normal mood and affect.    Laboratory Data:  Lab Results  Component Value Date   CREATININE 1.42 (H) 10/14/2015    Lab Results  Component Value Date   PSA 2.0 09/23/2013   PSA history:  1.8 ng/mL on 02/02/2014  2.1 ng/mL on 01/27/2016   1. BPH with LUTS  - IPSS score is 1/0, it is improving  - Continue conservative management, avoiding bladder irritants and timed voiding's  - Continue tamsulosin 0.4 mg daily  - RTC in 12 months for IPSS, PSA and exam   Return in about 1 year (around 02/02/2017) for IPSS, PSA and exam.  Zara Council, [facility]  [place] [address] [place], [place] [address] [phone]

## 2016-03-14 IMAGING — US US RETROPERITONEAL COMPLETE
1 series · 13 of 25 positions shown · non-contrast
Comparison: September 08, 2008

CLINICAL DATA: Abdominal bruit. Family history of abdominal aortic
aneurysm. Tobacco use.

EXAM:
ULTRASOUND RETROPERITONEAL COMPLETE
TECHNIQUE: Ultrasound examination of the abdominal aorta was performed to
evaluate for abdominal aortic aneurysm. The common iliac arteries,
IVC, and kidneys were also evaluated.

[Series 1: us retroperitoneal complete · 0.31mm/px · 13 of 50 slices shown]
[im 1/50]
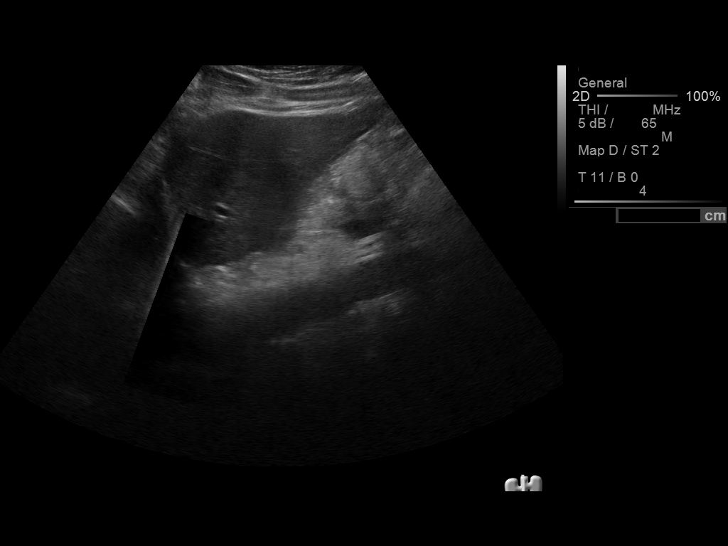
[im 5/50]
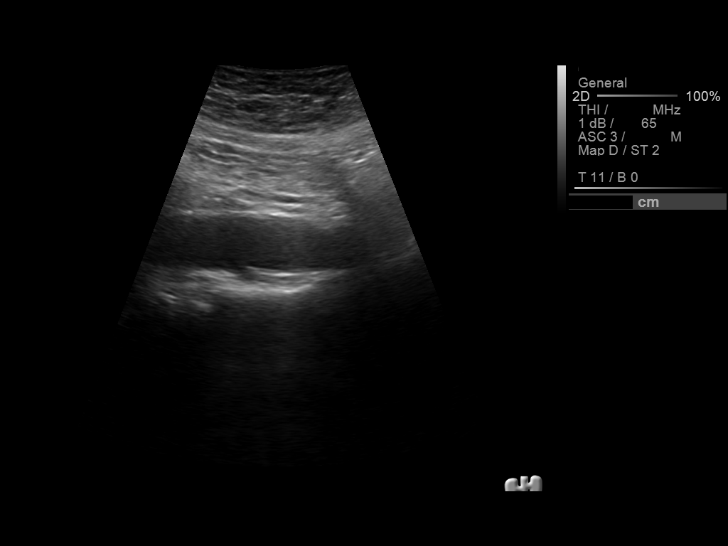
[im 9/50]
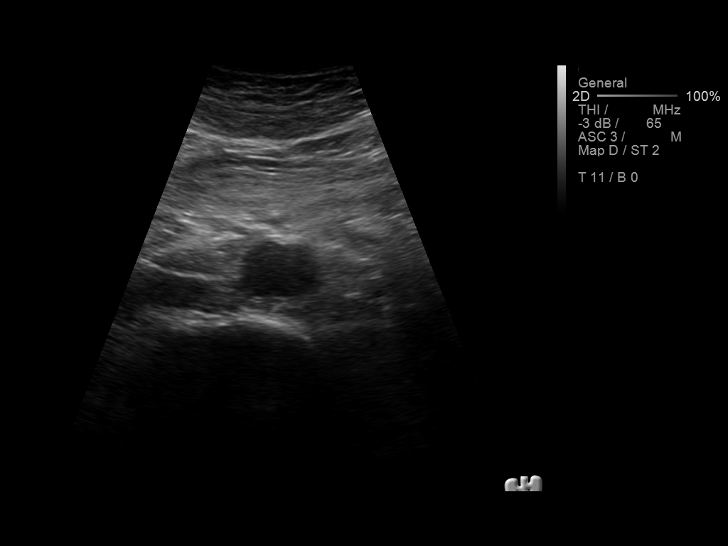
[im 13/50]
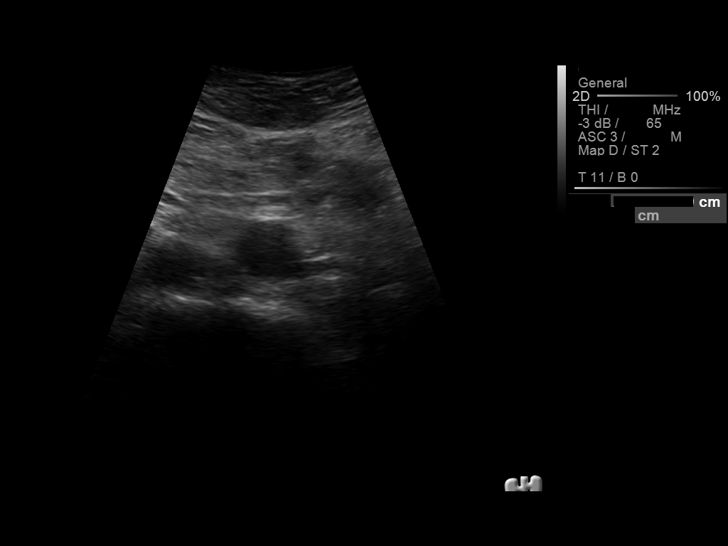
[im 17/50]
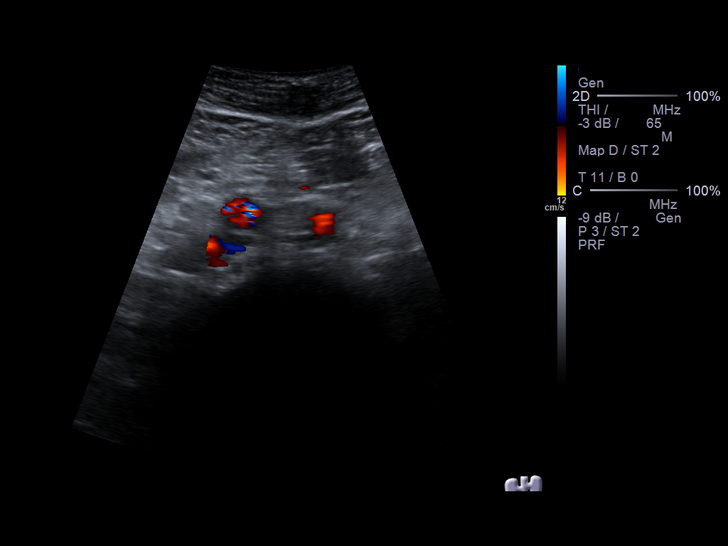
[im 21/50]
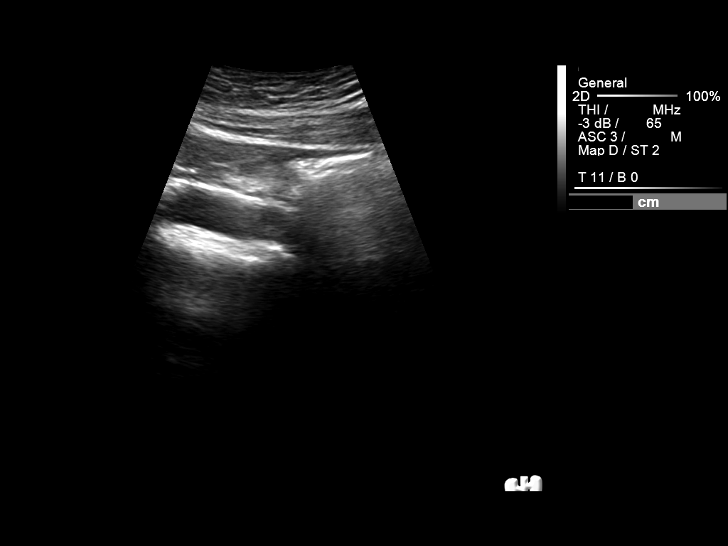
[im 25/50]
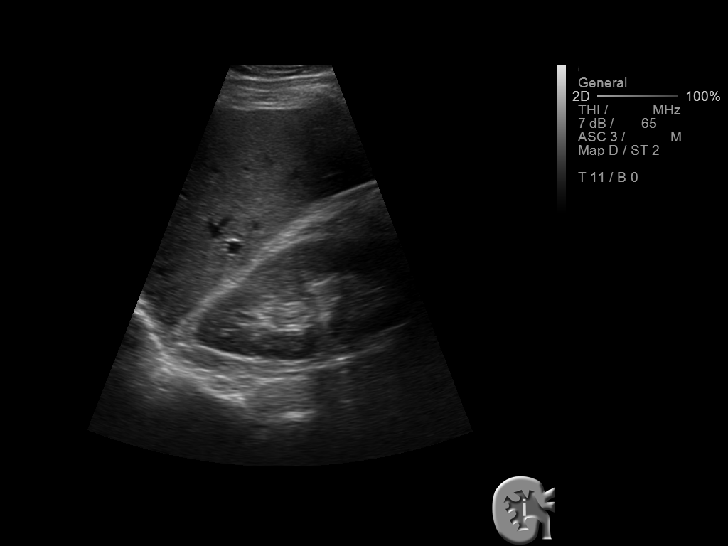
[im 29/50]
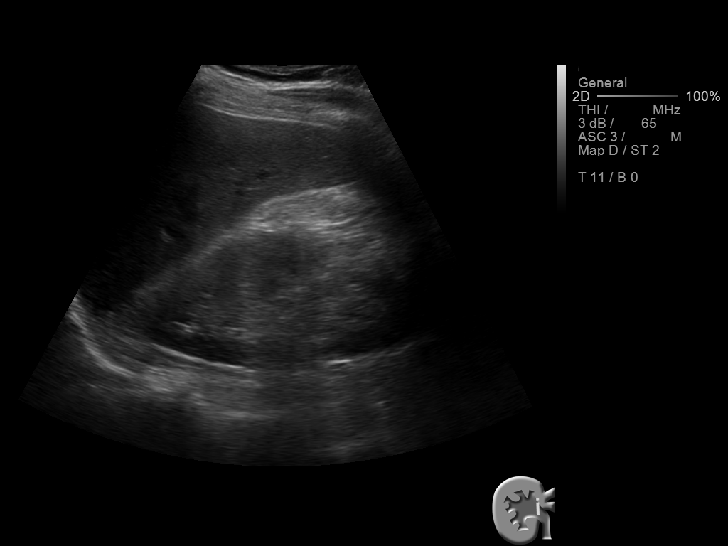
[im 33/50]
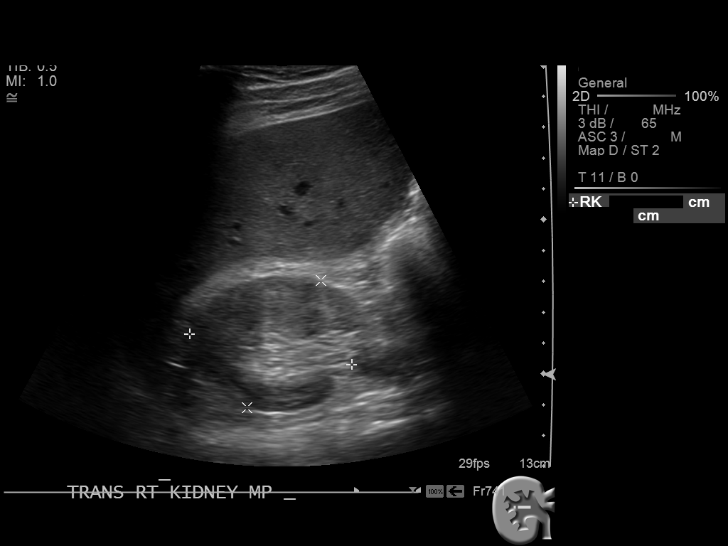
[im 37/50]
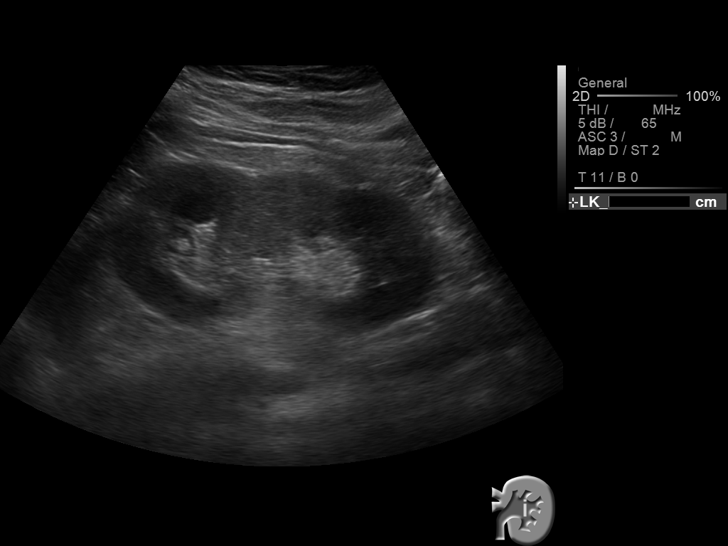
[im 41/50]
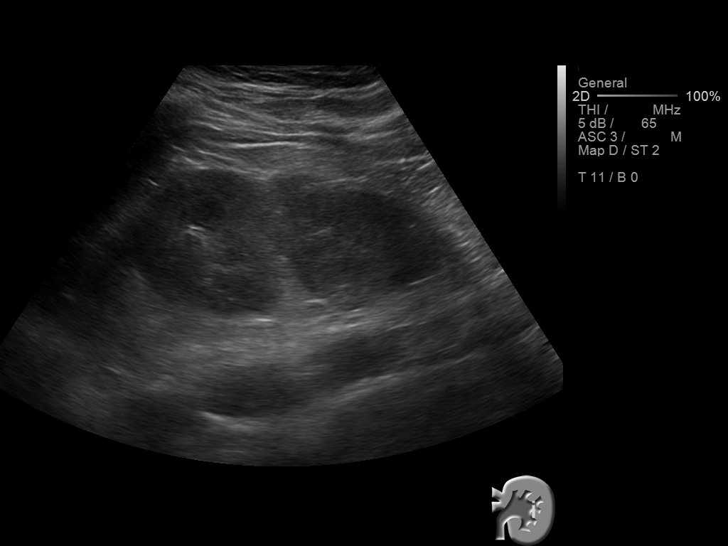
[im 45/50]
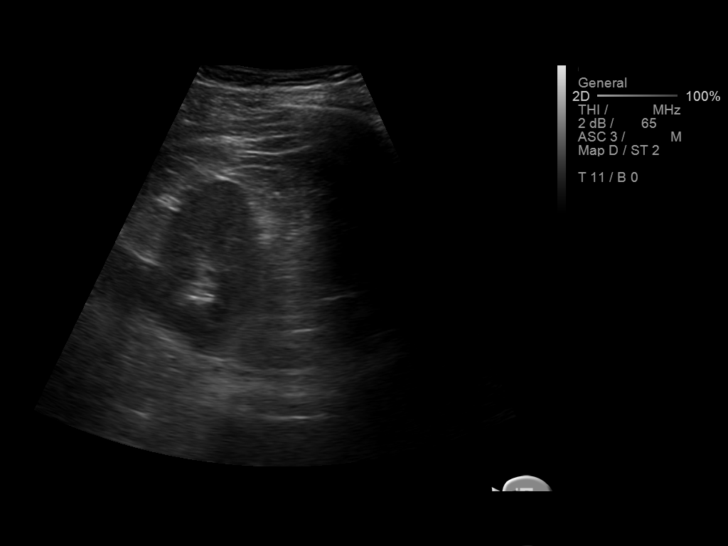
[im 50/50]
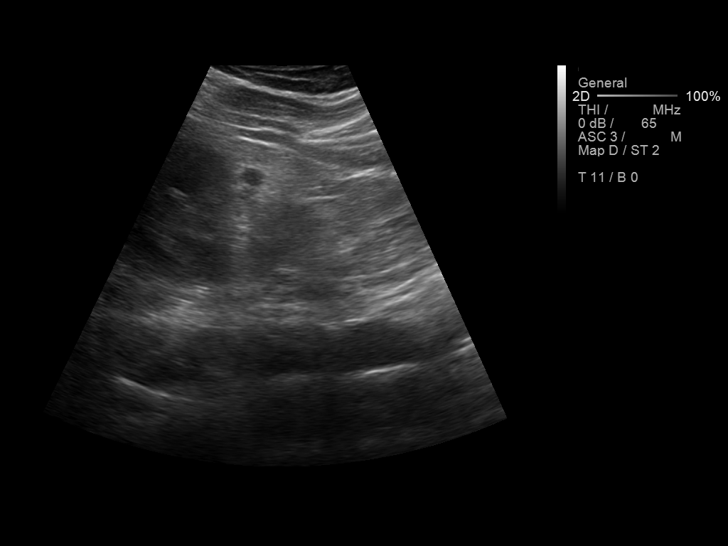

[13 of 25 positions shown; findings below may reference images not displayed]

FINDINGS: Abdominal Aorta

No aneurysm identified. Atherosclerotic changes noted in the aorta.
There is no periaortic fluid collection or retroperitoneal
adenopathy.

Maximum AP

Diameter:  2.3 cm

Maximum TRV

Diameter: 2.5 cm

Right Common Iliac Artery

No aneurysm identified.

Left Common Iliac Artery

No aneurysm identified.

IVC

No abnormality visualized.

Right Kidney

Length: 10.1 cm Echogenicity and renal cortical thickness are within
normal limits. No mass, perinephric fluid, or hydronephrosis
visualized. No sonographically demonstrable calculus or
ureterectasis.

Left Kidney

Length: 11.0 cm Echogenicity and renal cortical thickness are within
normal limits. No perinephric fluid or hydronephrosis visualized.
There is a simple cyst in the lower pole left kidney measuring 0.7 x
0.6 x 0.8 cm. No sonographically demonstrable calculus or
ureterectasis.
IMPRESSION: Atherosclerotic change in aorta without demonstrable aneurysm. Small
cyst left kidney. Study otherwise within normal limits.

## 2016-04-12 ENCOUNTER — Telehealth: Payer: Self-pay | Admitting: *Deleted

## 2016-04-12 NOTE — Telephone Encounter (Signed)
Attempted to leave voicemail for patient notifyng them that it is time to schedule annual low dose lung cancer screening CT scan. However, there is not voicmail option at phone number obtained from EMR. Will attempt again at later date.

## 2016-04-26 ENCOUNTER — Telehealth: Payer: Self-pay | Admitting: *Deleted

## 2016-04-26 DIAGNOSIS — Z87891 Personal history of nicotine dependence: Secondary | ICD-10-CM

## 2016-04-26 NOTE — Telephone Encounter (Signed)
Notified patient that annual lung cancer screening low dose CT scan is due. Confirmed that patient is within the age range of 55-77, and asymptomatic, (no signs or symptoms of lung cancer). Patient denies illness that would prevent curative treatment for lung cancer if found. The patient is a current smoker, with a 32.5 pack year history. The shared decision making visit was done 11/21/13. Patient is agreeable for CT scan being scheduled.

## 2016-05-18 ENCOUNTER — Other Ambulatory Visit: Payer: Self-pay

## 2016-05-18 DIAGNOSIS — K219 Gastro-esophageal reflux disease without esophagitis: Secondary | ICD-10-CM

## 2016-05-18 DIAGNOSIS — E538 Deficiency of other specified B group vitamins: Secondary | ICD-10-CM

## 2016-05-18 DIAGNOSIS — G63 Polyneuropathy in diseases classified elsewhere: Principal | ICD-10-CM

## 2016-05-18 MED ORDER — GABAPENTIN 300 MG PO CAPS: 300.0000 mg | ORAL_CAPSULE | Freq: Two times a day (BID) | ORAL | 1 refills | Status: DC

## 2016-05-18 MED ORDER — LANSOPRAZOLE 30 MG PO CPDR: 30.0000 mg | DELAYED_RELEASE_CAPSULE | Freq: Every day | ORAL | 2 refills | Status: DC

## 2016-05-18 NOTE — Telephone Encounter (Addendum)
Patient requesting refill of Gabapentin and Lansoprazole to [place].

## 2016-05-19 ENCOUNTER — Other Ambulatory Visit: Payer: Self-pay

## 2016-05-19 MED ORDER — TAMSULOSIN HCL 0.4 MG PO CAPS: 0.4000 mg | ORAL_CAPSULE | Freq: Every day | ORAL | 2 refills | Status: DC

## 2016-05-19 NOTE — Telephone Encounter (Signed)
Patient requesting refill of Flomax to [facility].

## 2016-05-22 ENCOUNTER — Ambulatory Visit (INDEPENDENT_AMBULATORY_CARE_PROVIDER_SITE_OTHER): Payer: Medicare Other | Admitting: Family Medicine

## 2016-05-22 ENCOUNTER — Encounter: Payer: Self-pay | Admitting: Family Medicine

## 2016-05-22 VITALS — BP 124/68 | HR 84 | Temp 99.1°F | Resp 16 | Ht 71.0 in | Wt 165.5 lb

## 2016-05-22 DIAGNOSIS — Z8719 Personal history of other diseases of the digestive system: Secondary | ICD-10-CM | POA: Diagnosis not present

## 2016-05-22 DIAGNOSIS — I10 Essential (primary) hypertension: Secondary | ICD-10-CM | POA: Diagnosis not present

## 2016-05-22 DIAGNOSIS — I700 Atherosclerosis of aorta: Secondary | ICD-10-CM

## 2016-05-22 DIAGNOSIS — E785 Hyperlipidemia, unspecified: Secondary | ICD-10-CM

## 2016-05-22 DIAGNOSIS — K219 Gastro-esophageal reflux disease without esophagitis: Secondary | ICD-10-CM

## 2016-05-22 DIAGNOSIS — I209 Angina pectoris, unspecified: Secondary | ICD-10-CM | POA: Diagnosis not present

## 2016-05-22 DIAGNOSIS — N183 Chronic kidney disease, stage 3 unspecified: Secondary | ICD-10-CM

## 2016-05-22 DIAGNOSIS — J449 Chronic obstructive pulmonary disease, unspecified: Secondary | ICD-10-CM

## 2016-05-22 DIAGNOSIS — E538 Deficiency of other specified B group vitamins: Secondary | ICD-10-CM

## 2016-05-22 DIAGNOSIS — I208 Other forms of angina pectoris: Secondary | ICD-10-CM | POA: Diagnosis not present

## 2016-05-22 DIAGNOSIS — Z8711 Personal history of peptic ulcer disease: Secondary | ICD-10-CM

## 2016-05-22 DIAGNOSIS — G63 Polyneuropathy in diseases classified elsewhere: Secondary | ICD-10-CM | POA: Diagnosis not present

## 2016-05-22 DIAGNOSIS — I2089 Other forms of angina pectoris: Secondary | ICD-10-CM

## 2016-05-22 MED ORDER — TIOTROPIUM BROMIDE MONOHYDRATE 18 MCG IN CAPS: ORAL_CAPSULE | RESPIRATORY_TRACT | 5 refills | Status: DC

## 2016-05-22 MED ORDER — ATORVASTATIN CALCIUM 40 MG PO TABS: 40.0000 mg | ORAL_TABLET | Freq: Every day | ORAL | 1 refills | Status: DC

## 2016-05-22 MED ORDER — NITROGLYCERIN 0.4 MG SL SUBL: 0.4000 mg | SUBLINGUAL_TABLET | SUBLINGUAL | 0 refills | Status: DC | PRN

## 2016-05-22 MED ORDER — PANTOPRAZOLE SODIUM 40 MG PO TBEC: 40.0000 mg | DELAYED_RELEASE_TABLET | Freq: Every day | ORAL | 1 refills | Status: DC

## 2016-05-22 NOTE — Progress Notes (Signed)
Name: Kevin Roberts   MRN: [id number]    DOB: Dec 01, 1945   Date:05/22/2016       Progress Note  Subjective  Chief Complaint  Chief Complaint  Patient presents with  . COPD    4 month follow up  . Hyperlipidemia  . Chronic Kidney Disease  . Hypertension  . Coronary Artery Disease  . Gastroesophageal Reflux    prevacid no longer covered by insurance    HPI  COPD Mild: he is taking Spiriva, cough is usually dry, he has SOB with moderate activity. No wheezing, denies nocturnal cough.  History of Alcoholism: he used to drink before retirement - about 2 shots at night, however when he retired at age 71 he started to drink about 1/5 of liquor every two days. He states he stopped drinking after two years because he fell on his face, developed a gastric ulcer and B12 deficiency. He is doing well since he stopped drinking. Insurance no longer covers Lansoprazole so we will switch to Pantoprazole. Discussed stopping medication because of long term side effects, but he is afraid to stop it.   HTN: taking medication, he denies dizziness, but feels tired, recent  chest pain ( last week ), occasionally has palpitation when he is upset  GERD: he states symptoms usually controlled with lansoprazole., he states when he skips medications for one day he develops heartburn , epigastric pain and has regurgitation.  Hyperlipidemia: taking Atorvastatin and denies side effects. Taking aspirin daily . Denies myalgia.   CAD: found on CT chest 3 vessel disease he denies chest pain ,it may be multifactorial from COPD also. He was seen by Dr. Fletcher Anon and advised to quit smoking. On aspirin, statins and beta-blocker. Cardiac cath in 2017 showed mild to moderate non-obstructive disease and is only on medical management. He had indigestion, and chest tightness last week while upset, it was intermittent and lasted about 15-20 minutes per episode. We will give him NTG, explained importance of calling 911 if not better  with medication.    B12 neuropathy: he is taking B12 supplementation and also takes Neurontin - symptoms are stable, he has occasional flares  Tobacco Abuse: he has been a smoker for 41 years, he is up to date with triple A screening, negative in 2016, chest CT done in 11/2013 and repeat CT done 04/21/2015. Explained importance of quitting smoking   Atherosclerosis abdominal aorta: on medical management, aspirin, beta-blocker and statin therapy. Needs to quit smoking   Patient Active Problem List   Diagnosis Date Noted  . History of alcoholism ([place]) 01/18/2016  . Gynecomastia 10/14/2015  . Abnormal nuclear stress test   . Anginal equivalent ([place])   . Atherosclerosis of aorta ([place]) 04/15/2015  . BPH with obstruction/lower urinary tract symptoms 02/03/2015  . Renal cyst, left 10/13/2014  . Tobacco use   . B12 neuropathy ([place]) 10/04/2014  . CAD, multiple vessel 10/04/2014  . Benign essential HTN 10/04/2014  . Chronic kidney disease (CKD), stage III (moderate) 10/04/2014  . Dyslipidemia 10/04/2014  . COPD, mild ([place]) 10/04/2014  . Gastro-esophageal reflux disease without esophagitis 10/04/2014  . Enlarged prostate 10/04/2014  . Vitamin D deficiency 10/04/2014    Past Surgical History:  Procedure Laterality Date  . CARDIAC CATHETERIZATION N/A 07/02/2015   Procedure: Left Heart Cath and Coronary Angiography;  Surgeon: Wellington Hampshire, MD;  Location: [facility];  Service: Cardiovascular;  Laterality: N/A;  . throat biopsy     lump removed    Family  History  Problem Relation Age of Onset  . Hypertension Mother   . CVA Mother   . Cancer Father     Lung  . Mental illness Brother     1-Schizophrenia  . GER disease Son   . Heart Problems Brother     Social History   Social History  . Marital status: Married    Spouse name: N/A  . Number of children: N/A  . Years of education: N/A   Occupational History  . Not on file.   Social History Main Topics  .  Smoking status: Current Every Day Smoker    Packs/day: 1.00    Years: 41.00    Types: Cigarettes    Start date: 10/04/1973  . Smokeless tobacco: Never Used  . Alcohol use No  . Drug use: No  . Sexual activity: No   Other Topics Concern  . Not on file   Social History Narrative  . No narrative on file     Current Outpatient Prescriptions:  .  aspirin 81 MG tablet, Take 1 tablet by mouth daily., Disp: , Rfl:  .  atorvastatin (LIPITOR) 40 MG tablet, Take 1 tablet (40 mg total) by mouth daily., Disp: 90 tablet, Rfl: 1 .  Cyanocobalamin (B-12) 1000 MCG TBCR, Take 1 tablet by mouth daily., Disp: , Rfl:  .  gabapentin (NEURONTIN) 300 MG capsule, Take 1 capsule (300 mg total) by mouth 2 (two) times daily., Disp: 180 capsule, Rfl: 1 .  metoprolol succinate (TOPROL-XL) 25 MG 24 hr tablet, Take 1 tablet (25 mg total) by mouth daily., Disp: 90 tablet, Rfl: 1 .  MULTIPLE VITAMIN PO, Take 1 tablet by mouth daily., Disp: , Rfl:  .  nitroGLYCERIN (NITROSTAT) 0.4 MG SL tablet, Place 1 tablet (0.4 mg total) under the tongue every 5 (five) minutes as needed for chest pain., Disp: 50 tablet, Rfl: 0 .  pantoprazole (PROTONIX) 40 MG tablet, Take 1 tablet (40 mg total) by mouth daily., Disp: 90 tablet, Rfl: 1 .  tamsulosin (FLOMAX) 0.4 MG CAPS capsule, Take 1 capsule (0.4 mg total) by mouth daily., Disp: 90 capsule, Rfl: 2 .  tiotropium (SPIRIVA HANDIHALER) 18 MCG inhalation capsule, INHALE ONE DOSE BY MOUTH ONCE DAILY FOR COPD., Disp: 30 capsule, Rfl: 5 .  Vitamin D, Cholecalciferol, 1000 UNITS CAPS, Take 1 tablet by mouth daily., Disp: , Rfl:   No Known Allergies   ROS  Constitutional: Negative for fever , positive for weight change.  Respiratory: Positive for occasional cough, he has  shortness of breath with moderate activity.   Cardiovascular: Positive for intermittent chest pain and  palpitations.  Gastrointestinal: Negative for abdominal pain, no bowel changes.  Musculoskeletal: Negative for  gait problem or joint swelling.  Skin: Negative for rash.  Neurological: Negative for dizziness or headache.  No other specific complaints in a complete review of systems (except as listed in HPI above).  Objective  Vitals:   05/22/16 1001  BP: 124/68  Pulse: 84  Resp: 16  Temp: 99.1 F (37.3 C)  SpO2: 92%  Weight: 165 lb 8 oz (75.1 kg)  Height: 5\' 11"  (1.803 m)    Body mass index is 23.08 kg/m.  Physical Exam  Constitutional: Patient appears well-developed and thin.  No distress.  HEENT: head atraumatic, normocephalic, pupils equal and reactive to light,  neck supple, throat within normal limits Cardiovascular: Normal rate, regular rhythm and normal heart sounds.  No murmur heard. No BLE edema. Pulmonary/Chest: Effort normal and breath sounds  normal. No respiratory distress. Abdominal: Soft.  There is no tenderness. Psychiatric: Patient has a normal mood and affect. behavior is normal. Judgment and thought content normal.  PHQ2/9: Depression screen [facility] 2/9 05/22/2016 01/18/2016 10/14/2015 04/15/2015 10/05/2014  Decreased Interest 0 0 0 0 0  Down, Depressed, Hopeless 0 0 0 0 0  PHQ - 2 Score 0 0 0 0 0     Fall Risk: Fall Risk  05/22/2016 01/18/2016 10/14/2015 04/15/2015 10/05/2014  Falls in the past year? No No No No No     Functional Status Survey: Is the patient deaf or have difficulty hearing?: No Does the patient have difficulty seeing, even when wearing glasses/contacts?: No Does the patient have difficulty concentrating, remembering, or making decisions?: No Does the patient have difficulty walking or climbing stairs?: No Does the patient have difficulty dressing or bathing?: No Does the patient have difficulty doing errands alone such as visiting a doctor's office or shopping?: No   Assessment & Plan  1. COPD, mild ([place])  - Spirometry: Pre & Post Eval  Normal FeV1-FVC, improvement on FEF 25-75% - tiotropium (SPIRIVA HANDIHALER) 18 MCG inhalation capsule; INHALE  ONE DOSE BY MOUTH ONCE DAILY FOR COPD.  Dispense: 30 capsule; Refill: 5  2. Atherosclerosis of aorta (HCC)  - nitroGLYCERIN (NITROSTAT) 0.4 MG SL tablet; Place 1 tablet (0.4 mg total) under the tongue every 5 (five) minutes as needed for chest pain.  Dispense: 50 tablet; Refill: 0  3. Benign essential HTN  Well controlled   4. Anginal equivalent ([place])  He had symptoms last week, we will try SL NTG - nitroGLYCERIN (NITROSTAT) 0.4 MG SL tablet; Place 1 tablet (0.4 mg total) under the tongue every 5 (five) minutes as needed for chest pain.  Dispense: 50 tablet; Refill: 0  5. B12 neuropathy ([place])   6. Dyslipidemia  - atorvastatin (LIPITOR) 40 MG tablet; Take 1 tablet (40 mg total) by mouth daily.  Dispense: 90 tablet; Refill: 1  7. Chronic kidney disease (CKD), stage III (moderate)  Avoid nsaid's  8. History of gastric ulcer  - pantoprazole (PROTONIX) 40 MG tablet; Take 1 tablet (40 mg total) by mouth daily.  Dispense: 90 tablet; Refill: 1  9. GERD without esophagitis  - pantoprazole (PROTONIX) 40 MG tablet; Take 1 tablet (40 mg total) by mouth daily.  Dispense: 90 tablet; Refill: 1

## 2016-05-24 ENCOUNTER — Ambulatory Visit: Payer: Medicare Other

## 2016-06-02 ENCOUNTER — Ambulatory Visit: Payer: Medicare Other

## 2016-06-06 ENCOUNTER — Ambulatory Visit
Admission: RE | Admit: 2016-06-06 | Discharge: 2016-06-06 | Disposition: A | Discharge status: Home / Self Care | Payer: Medicare Other | Source: Ambulatory Visit | Attending: Oncology | Admitting: Oncology

## 2016-06-06 DIAGNOSIS — J439 Emphysema, unspecified: Secondary | ICD-10-CM | POA: Diagnosis not present

## 2016-06-06 DIAGNOSIS — Z87891 Personal history of nicotine dependence: Secondary | ICD-10-CM | POA: Diagnosis not present

## 2016-06-06 DIAGNOSIS — I2510 Atherosclerotic heart disease of native coronary artery without angina pectoris: Secondary | ICD-10-CM | POA: Diagnosis not present

## 2016-06-06 DIAGNOSIS — I700 Atherosclerosis of aorta: Secondary | ICD-10-CM | POA: Insufficient documentation

## 2016-06-06 DIAGNOSIS — F17210 Nicotine dependence, cigarettes, uncomplicated: Secondary | ICD-10-CM | POA: Diagnosis not present

## 2016-06-09 ENCOUNTER — Encounter: Payer: Self-pay | Admitting: *Deleted

## 2016-07-31 ENCOUNTER — Other Ambulatory Visit: Payer: Self-pay | Admitting: Family Medicine

## 2016-07-31 ENCOUNTER — Other Ambulatory Visit: Payer: Self-pay

## 2016-07-31 DIAGNOSIS — E785 Hyperlipidemia, unspecified: Secondary | ICD-10-CM

## 2016-07-31 NOTE — Telephone Encounter (Signed)
Patient requesting refill of Atorvastatin to [facility].

## 2016-08-02 ENCOUNTER — Other Ambulatory Visit: Payer: Self-pay

## 2016-08-02 DIAGNOSIS — E785 Hyperlipidemia, unspecified: Secondary | ICD-10-CM

## 2016-08-02 MED ORDER — ATORVASTATIN CALCIUM 40 MG PO TABS: 40.0000 mg | ORAL_TABLET | Freq: Every day | ORAL | 1 refills | Status: DC

## 2016-08-03 ENCOUNTER — Encounter: Payer: Self-pay | Admitting: Cardiovascular Disease

## 2016-08-03 ENCOUNTER — Ambulatory Visit (INDEPENDENT_AMBULATORY_CARE_PROVIDER_SITE_OTHER): Payer: Medicare Other | Admitting: Cardiovascular Disease

## 2016-08-03 VITALS — BP 130/62 | HR 50 | Ht 71.0 in | Wt 166.0 lb

## 2016-08-03 DIAGNOSIS — E785 Hyperlipidemia, unspecified: Secondary | ICD-10-CM | POA: Diagnosis not present

## 2016-08-03 DIAGNOSIS — I10 Essential (primary) hypertension: Secondary | ICD-10-CM | POA: Diagnosis not present

## 2016-08-03 DIAGNOSIS — I2510 Atherosclerotic heart disease of native coronary artery without angina pectoris: Secondary | ICD-10-CM

## 2016-08-03 DIAGNOSIS — Z720 Tobacco use: Secondary | ICD-10-CM | POA: Diagnosis not present

## 2016-08-03 NOTE — Addendum Note (Signed)
Addended by: Kittie Plater on: 08/03/2016 11:03 AM   Modules accepted: Orders

## 2016-08-03 NOTE — Progress Notes (Signed)
Cardiology Office Note   Date:  08/03/2016   ID:  Kevin Roberts, DOB Jul 15, 1945, MRN [id number]  PCP:  Loistine Chance, MD  Cardiologist:   [name], MD   Chief Complaint  Patient presents with  . other    40mo f/u. Pt c/o fatigue, sob on exertion, denies cp. Reviewed meds with pt verbally.      History of Present Illness: Kevin Roberts is a 71 y.o. male who is here today for a follow-up visit regarding mild to moderate calcified coronary artery disease. He has extensive risk factors including hypertension, hyperlipidemia and prolonged history of tobacco use. He smokes one and a half pack per day. He also has COPD.  Cardiac catheterization in 2017 showed mild to moderate nonobstructive coronary artery disease with overall moderately to heavily calcified arteries especially the LAD. Ejection fraction was normal as well as left ventricular end-diastolic pressure. He had an abdominal aortic ultrasound in 2016 which showed no evidence of aortic aneurysm. The patient has been doing reasonably well and denies any recent chest pain or worsening dyspnea. No claudication. He has been taking his medications regularly but unfortunately, he continues to smoke. He is noted to be mildly bradycardic but denies dizziness even with changing position.   Past Medical History:  Diagnosis Date  . Benign prostatic hypertrophy   . CAD (coronary atherosclerotic disease)   . Chronic kidney disease, stage III (moderate)   . COPD (chronic obstructive pulmonary disease) ([place])   . Cramps, muscle, general   . Elevated ferritin   . GERD (gastroesophageal reflux disease)   . History of hypercalcemia   . Hyperlipidemia   . Hypertension   . Incomplete bladder emptying   . Nocturia   . Peripheral polyneuropathy   . Rising PSA level   . Tobacco use   . Urgency of micturation   . Vitamin B 12 deficiency     Past Surgical History:  Procedure Laterality Date  . CARDIAC CATHETERIZATION N/A  07/02/2015   Procedure: Left Heart Cath and Coronary Angiography;  Surgeon: Wellington Hampshire, MD;  Location: [facility];  Service: Cardiovascular;  Laterality: N/A;  . throat biopsy     lump removed     Current Outpatient Prescriptions  Medication Sig Dispense Refill  . aspirin 81 MG tablet Take 1 tablet by mouth daily.    Marland Kitchen atorvastatin (LIPITOR) 40 MG tablet Take 1 tablet (40 mg total) by mouth daily. 90 tablet 1  . Cyanocobalamin (B-12) 1000 MCG TBCR Take 1 tablet by mouth daily.    Marland Kitchen gabapentin (NEURONTIN) 300 MG capsule Take 1 capsule (300 mg total) by mouth 2 (two) times daily. 180 capsule 1  . metoprolol succinate (TOPROL-XL) 25 MG 24 hr tablet Take 1 tablet (25 mg total) by mouth daily. 90 tablet 1  . MULTIPLE VITAMIN PO Take 1 tablet by mouth daily.    . nitroGLYCERIN (NITROSTAT) 0.4 MG SL tablet Place 1 tablet (0.4 mg total) under the tongue every 5 (five) minutes as needed for chest pain. 50 tablet 0  . pantoprazole (PROTONIX) 40 MG tablet Take 1 tablet (40 mg total) by mouth daily. 90 tablet 1  . tamsulosin (FLOMAX) 0.4 MG CAPS capsule Take 1 capsule (0.4 mg total) by mouth daily. 90 capsule 2  . tiotropium (SPIRIVA HANDIHALER) 18 MCG inhalation capsule INHALE ONE DOSE BY MOUTH ONCE DAILY FOR COPD. 30 capsule 5  . Vitamin D, Cholecalciferol, 1000 UNITS CAPS Take 1 tablet by  mouth daily.     No current facility-administered medications for this visit.     Allergies:   Patient has no known allergies.    Social History:  The patient  reports that he has been smoking Cigarettes.  He started smoking about 42 years ago. He has a 41.00 pack-year smoking history. He has never used smokeless tobacco. He reports that he does not drink alcohol or use drugs.   Family History:  The patient's family history includes CVA in his mother; Cancer in his father; GER disease in his son; Heart Problems in his brother; Hypertension in his mother; Mental illness in his brother.    ROS:   Please see the history of present illness.   Otherwise, review of systems are positive for none.   All other systems are reviewed and negative.    PHYSICAL EXAM: VS:  BP 130/62 (BP Location: Left Arm, Patient Position: Sitting, Cuff Size: Normal)   Pulse (!) 50   Ht 5\' 11"  (1.803 m)   Wt 166 lb (75.3 kg)   BMI 23.15 kg/m  , BMI Body mass index is 23.15 kg/m. GEN: Well nourished, well developed, in no acute distress  HEENT: normal  Neck: no JVD, carotid bruits, or masses Cardiac: RRR; no murmurs, rubs, or gallops,no edema  Respiratory:  clear to auscultation bilaterally, normal work of breathing GI: soft, nontender, nondistended, + BS MS: no deformity or atrophy  Skin: warm and dry, no rash Neuro:  Strength and sensation are intact Psych: euthymic mood, full affect   EKG:  EKG is  ordered today. EKG showed sinus bradycardia with sinus arrhythmia no significant ST or T wave changes.   Recent Labs: 10/14/2015: ALT 17; BUN 16; Creat 1.42; Potassium 4.3; Sodium 139    Lipid Panel    Component Value Date/Time   CHOL 101 (L) 10/14/2015 1137   CHOL 92 (L) 12/03/2014 1216   TRIG 55 10/14/2015 1137   HDL 52 10/14/2015 1137   HDL 49 12/03/2014 1216   CHOLHDL 1.9 10/14/2015 1137   VLDL 11 10/14/2015 1137   LDLCALC 38 10/14/2015 1137   LDLCALC 33 12/03/2014 1216      Wt Readings from Last 3 Encounters:  08/03/16 166 lb (75.3 kg)  06/06/16 165 lb (74.8 kg)  05/22/16 165 lb 8 oz (75.1 kg)         ASSESSMENT AND PLAN:  1.  Coronary artery disease involving native coronary artery without angina: Continue medical therapy and low-dose aspirin.  2. Hyperlipidemia: Continue treatment with atorvastatin. Most recent LDL was 38.  3. Tobacco use: I again discussed with him the importance of smoking cessation . He reports inability to quit.  4.  Essential hypertension: Blood pressure is controlled on current medications. He is mildly bradycardic but overall is asymptomatic. If  bradycardia worsens, we can switch metoprolol to a different antihypertensive medication.   Disposition:   FU with me in one year   Signed,  [name], MD  08/03/2016 10:45 AM    [place]

## 2016-08-03 NOTE — Patient Instructions (Signed)
Medication Instructions: Continue same medications.   Labwork: None.   Procedures/Testing: None.   Follow-Up: 1 year with Dr. Wyndell Cardiff.   Any Additional Special Instructions Will Be Listed Below (If Applicable).     If you need a refill on your cardiac medications before your next appointment, please call your pharmacy.   

## 2016-09-20 ENCOUNTER — Ambulatory Visit (INDEPENDENT_AMBULATORY_CARE_PROVIDER_SITE_OTHER): Payer: Medicare Other | Admitting: Family Medicine

## 2016-09-20 ENCOUNTER — Encounter: Payer: Self-pay | Admitting: Family Medicine

## 2016-09-20 VITALS — BP 106/74 | HR 54 | Temp 97.9°F | Resp 16 | Wt 162.2 lb

## 2016-09-20 DIAGNOSIS — J449 Chronic obstructive pulmonary disease, unspecified: Secondary | ICD-10-CM

## 2016-09-20 DIAGNOSIS — G63 Polyneuropathy in diseases classified elsewhere: Secondary | ICD-10-CM | POA: Diagnosis not present

## 2016-09-20 DIAGNOSIS — N183 Chronic kidney disease, stage 3 unspecified: Secondary | ICD-10-CM

## 2016-09-20 DIAGNOSIS — Z8711 Personal history of peptic ulcer disease: Secondary | ICD-10-CM

## 2016-09-20 DIAGNOSIS — I10 Essential (primary) hypertension: Secondary | ICD-10-CM

## 2016-09-20 DIAGNOSIS — I209 Angina pectoris, unspecified: Secondary | ICD-10-CM

## 2016-09-20 DIAGNOSIS — F1021 Alcohol dependence, in remission: Secondary | ICD-10-CM | POA: Diagnosis not present

## 2016-09-20 DIAGNOSIS — Z8719 Personal history of other diseases of the digestive system: Secondary | ICD-10-CM

## 2016-09-20 DIAGNOSIS — I700 Atherosclerosis of aorta: Secondary | ICD-10-CM | POA: Diagnosis not present

## 2016-09-20 DIAGNOSIS — E785 Hyperlipidemia, unspecified: Secondary | ICD-10-CM

## 2016-09-20 DIAGNOSIS — E538 Deficiency of other specified B group vitamins: Secondary | ICD-10-CM | POA: Diagnosis not present

## 2016-09-20 DIAGNOSIS — I208 Other forms of angina pectoris: Secondary | ICD-10-CM | POA: Diagnosis not present

## 2016-09-20 DIAGNOSIS — K219 Gastro-esophageal reflux disease without esophagitis: Secondary | ICD-10-CM

## 2016-09-20 DIAGNOSIS — I2510 Atherosclerotic heart disease of native coronary artery without angina pectoris: Secondary | ICD-10-CM

## 2016-09-20 MED ORDER — METOPROLOL SUCCINATE ER 25 MG PO TB24: 25.0000 mg | ORAL_TABLET | Freq: Every day | ORAL | 1 refills | Status: DC

## 2016-09-20 MED ORDER — TIOTROPIUM BROMIDE MONOHYDRATE 18 MCG IN CAPS: ORAL_CAPSULE | RESPIRATORY_TRACT | 5 refills | Status: DC

## 2016-09-20 MED ORDER — PANTOPRAZOLE SODIUM 40 MG PO TBEC: 40.0000 mg | DELAYED_RELEASE_TABLET | Freq: Every day | ORAL | 1 refills | Status: DC

## 2016-09-20 MED ORDER — GABAPENTIN 300 MG PO CAPS: 300.0000 mg | ORAL_CAPSULE | Freq: Two times a day (BID) | ORAL | 1 refills | Status: DC

## 2016-09-20 NOTE — Progress Notes (Signed)
Name: Kevin Roberts   MRN: [id number]    DOB: Jan 02, 1946   Date:09/20/2016       Progress Note  Subjective  Chief Complaint  Chief Complaint  Patient presents with  . Medication Reaction    4 month F/U  . COPD    Unchanged  . Hypertension    Denies any symptoms  . Gastroesophageal Reflux    changed medication last visit due to insurance and not working as well for him. Still having break through acid reflux symptoms  . Coronary Artery Disease    HPI    COPD Mild: he is taking Spiriva, he states no longer coughing,  he has SOB with moderate activity. No wheezing.   History of Alcoholism: he used to drink before retirement - about 2 shots at night, however when he retired at age 44 he started to drink about 1/5 of liquor every two days. He states he stopped drinking after two years because he fell on his face, developed a gastric ulcer and B12 deficiency. He is doing well since he stopped drinking. Insurance no longer covers Lansoprazole so we will switch to Pantoprazole.   HTN: taking medication, he denies dizziness, but feels tired, no recent episodes of chest pain, occasionally has palpitation when he is upset  GERD and history of gastric ulcer:  He states symptoms controlled as long as he takes a PPI, no heartburn or regurgitation.   Hyperlipidemia: taking Atorvastatin and denies side effects. Taking aspirin daily . Denies myalgia.   CAD: found on CT chest 3 vessel disease he denies chest pain ,it may be multifactorial from COPD also. He was seen by Dr. Fletcher Anon and advised to quit smoking. On aspirin, statins and beta-blocker. Cardiac cath in 2017 showed mild to moderate non-obstructive disease and is only on medical management. He saw Dr. Fletcher Anon in May, no recent episodes of chest pain.   B12 neuropathy: he is taking B12 supplementation and also takes Neurontin - symptoms are stable, he has occasional flares  Tobacco Abuse: he has been a smoker for 41 years, he is up to  date with triple A screening, negative in 2016, chest CT done in 11/2013 and repeat CT done 04/21/2015. Explained importance of quitting smoking   Atherosclerosis abdominal aorta: on medical management, aspirin, beta-blocker and statin therapy. Needs to quit smoking   Patient Active Problem List   Diagnosis Date Noted  . History of alcoholism ([place]) 01/18/2016  . Gynecomastia 10/14/2015  . Abnormal nuclear stress test   . Anginal equivalent ([place])   . Atherosclerosis of aorta ([place]) 04/15/2015  . BPH with obstruction/lower urinary tract symptoms 02/03/2015  . Renal cyst, left 10/13/2014  . Tobacco use   . B12 neuropathy ([place]) 10/04/2014  . CAD, multiple vessel 10/04/2014  . Benign essential HTN 10/04/2014  . Chronic kidney disease (CKD), stage III (moderate) 10/04/2014  . Dyslipidemia 10/04/2014  . COPD, mild ([place]) 10/04/2014  . Gastro-esophageal reflux disease without esophagitis 10/04/2014  . Enlarged prostate 10/04/2014  . Vitamin D deficiency 10/04/2014    Past Surgical History:  Procedure Laterality Date  . CARDIAC CATHETERIZATION N/A 07/02/2015   Procedure: Left Heart Cath and Coronary Angiography;  Surgeon: Wellington Hampshire, MD;  Location: [facility];  Service: Cardiovascular;  Laterality: N/A;  . throat biopsy     lump removed    Family History  Problem Relation Age of Onset  . Hypertension Mother   . CVA Mother   . Cancer Father  Lung  . Mental illness Brother        1-Schizophrenia  . GER disease Son   . Heart Problems Brother     Social History   Social History  . Marital status: Married    Spouse name: N/A  . Number of children: N/A  . Years of education: N/A   Occupational History  . Not on file.   Social History Main Topics  . Smoking status: Current Every Day Smoker    Packs/day: 1.00    Years: 41.00    Types: Cigarettes    Start date: 10/04/1973  . Smokeless tobacco: Never Used     Comment: pack a day  . Alcohol use No  .  Drug use: No  . Sexual activity: No   Other Topics Concern  . Not on file   Social History Narrative  . No narrative on file     Current Outpatient Prescriptions:  .  aspirin 81 MG tablet, Take 1 tablet by mouth daily., Disp: , Rfl:  .  atorvastatin (LIPITOR) 40 MG tablet, Take 1 tablet (40 mg total) by mouth daily., Disp: 90 tablet, Rfl: 1 .  Cyanocobalamin (B-12) 1000 MCG TBCR, Take 1 tablet by mouth daily., Disp: , Rfl:  .  gabapentin (NEURONTIN) 300 MG capsule, Take 1 capsule (300 mg total) by mouth 2 (two) times daily., Disp: 180 capsule, Rfl: 1 .  metoprolol succinate (TOPROL-XL) 25 MG 24 hr tablet, Take 1 tablet (25 mg total) by mouth daily., Disp: 90 tablet, Rfl: 1 .  MULTIPLE VITAMIN PO, Take 1 tablet by mouth daily., Disp: , Rfl:  .  nitroGLYCERIN (NITROSTAT) 0.4 MG SL tablet, Place 1 tablet (0.4 mg total) under the tongue every 5 (five) minutes as needed for chest pain., Disp: 50 tablet, Rfl: 0 .  pantoprazole (PROTONIX) 40 MG tablet, Take 1 tablet (40 mg total) by mouth daily., Disp: 90 tablet, Rfl: 1 .  tamsulosin (FLOMAX) 0.4 MG CAPS capsule, Take 1 capsule (0.4 mg total) by mouth daily., Disp: 90 capsule, Rfl: 2 .  tiotropium (SPIRIVA HANDIHALER) 18 MCG inhalation capsule, INHALE ONE DOSE BY MOUTH ONCE DAILY FOR COPD., Disp: 30 capsule, Rfl: 5 .  Vitamin D, Cholecalciferol, 1000 UNITS CAPS, Take 1 tablet by mouth daily., Disp: , Rfl:   No Known Allergies   ROS  Constitutional: Negative for fever or weight change.  Respiratory: Positive  for cough and shortness of breath.   Cardiovascular: Negative for chest pain or palpitations.  Gastrointestinal: Negative for abdominal pain, no bowel changes.  Musculoskeletal: Negative for gait problem or joint swelling.  Skin: Negative for rash.  Neurological: Negative for dizziness or headache.  No other specific complaints in a complete review of systems (except as listed in HPI above).  Objective  Vitals:   09/20/16 1022   BP: 106/74  Pulse: (!) 54  Resp: 16  Temp: 97.9 F (36.6 C)  TempSrc: Oral  SpO2: 91%  Weight: 162 lb 3.2 oz (73.6 kg)    Body mass index is 22.62 kg/m.  Physical Exam  Constitutional: Patient appears well-developed and well-nourished.  No distress.  HEENT: head atraumatic, normocephalic, pupils equal and reactive to light,  neck supple, throat within normal limits Cardiovascular: Normal rate, regular rhythm and normal heart sounds.  No murmur heard. No BLE edema. Pulmonary/Chest: Effort normal and breath sounds normal. No respiratory distress. Abdominal: Soft.  There is no tenderness. Psychiatric: Patient has a normal mood and affect. behavior is normal. Judgment and thought  content normal.  PHQ2/9: Depression screen [facility] 2/9 09/20/2016 05/22/2016 01/18/2016 10/14/2015 04/15/2015  Decreased Interest 0 0 0 0 0  Down, Depressed, Hopeless 0 0 0 0 0  PHQ - 2 Score 0 0 0 0 0     Fall Risk: Fall Risk  09/20/2016 05/22/2016 01/18/2016 10/14/2015 04/15/2015  Falls in the past year? No No No No No     Functional Status Survey: Is the patient deaf or have difficulty hearing?: No Does the patient have difficulty seeing, even when wearing glasses/contacts?: No Does the patient have difficulty concentrating, remembering, or making decisions?: No Does the patient have difficulty walking or climbing stairs?: No Does the patient have difficulty dressing or bathing?: No Does the patient have difficulty doing errands alone such as visiting a doctor's office or shopping?: No   Assessment & Plan  1. COPD, mild ([place])  Still smoking , he states medication helps - tiotropium (SPIRIVA HANDIHALER) 18 MCG inhalation capsule; INHALE ONE DOSE BY MOUTH ONCE DAILY FOR COPD.  Dispense: 30 capsule; Refill: 5  2. Atherosclerosis of aorta (HCC)  On aspirin, beta-blocker and statin therapy   3. Benign essential HTN  - COMPLETE METABOLIC PANEL WITH GFR - CBC with Differential/Platelet  4. Anginal  equivalent (HCC)  No recent episodes of chest pain  5. B12 neuropathy ([facility])  - gabapentin (NEURONTIN) 300 MG capsule; Take 1 capsule (300 mg total) by mouth 2 (two) times daily.  Dispense: 180 capsule; Refill: 1 - Vitamin B12  6. Dyslipidemia  - Lipid panel  7. Chronic kidney disease (CKD), stage III (moderate)  Recheck level   8. History of gastric ulcer  - pantoprazole (PROTONIX) 40 MG tablet; Take 1 tablet (40 mg total) by mouth daily.  Dispense: 90 tablet; Refill: 1  9. History of alcoholism ([place])  In remission   10. GERD without esophagitis  - pantoprazole (PROTONIX) 40 MG tablet; Take 1 tablet (40 mg total) by mouth daily.  Dispense: 90 tablet; Refill: 1

## 2016-09-22 DIAGNOSIS — I10 Essential (primary) hypertension: Secondary | ICD-10-CM | POA: Diagnosis not present

## 2016-09-22 DIAGNOSIS — G63 Polyneuropathy in diseases classified elsewhere: Secondary | ICD-10-CM | POA: Diagnosis not present

## 2016-09-22 DIAGNOSIS — E538 Deficiency of other specified B group vitamins: Secondary | ICD-10-CM | POA: Diagnosis not present

## 2016-09-22 DIAGNOSIS — E785 Hyperlipidemia, unspecified: Secondary | ICD-10-CM | POA: Diagnosis not present

## 2016-09-22 LAB — COMPLETE METABOLIC PANEL WITH GFR
ALT: 18 U/L (ref 9–46)
AST: 24 U/L (ref 10–35)
Albumin: 4.4 g/dL (ref 3.6–5.1)
Alkaline Phosphatase: 62 U/L (ref 40–115)
BUN: 19 mg/dL (ref 7–25)
CHLORIDE: 108 mmol/L (ref 98–110)
CO2: 26 mmol/L (ref 20–31)
CREATININE: 1.59 mg/dL — AB (ref 0.70–1.25)
Calcium: 9.8 mg/dL (ref 8.6–10.3)
GFR, EST AFRICAN AMERICAN: 50 mL/min — AB (ref >=60)
GFR, Est Non African American: 44 mL/min — ABNORMAL LOW (ref >=60)
Glucose, Bld: 92 mg/dL (ref 65–99)
Potassium: 4.3 mmol/L (ref 3.5–5.3)
Sodium: 142 mmol/L (ref 135–146)
Total Bilirubin: 1.2 mg/dL (ref 0.2–1.2)
Total Protein: 6.8 g/dL (ref 6.1–8.1)

## 2016-09-22 LAB — CBC WITH DIFFERENTIAL/PLATELET
BASOS PCT: 0 %
Basophils Absolute: 0 cells/uL (ref 0–200)
Eosinophils Absolute: 70 cells/uL (ref 15–500)
Eosinophils Relative: 1 %
HEMATOCRIT: 43.1 % (ref 38.5–50.0)
Hemoglobin: 14.2 g/dL (ref 13.2–17.1)
LYMPHS PCT: 28 %
Lymphs Abs: 1960 cells/uL (ref 850–3900)
MCH: 30.5 pg (ref 27.0–33.0)
MCHC: 32.9 g/dL (ref 32.0–36.0)
MCV: 92.7 fL (ref 80.0–100.0)
MONOS PCT: 10 %
MPV: 11.9 fL (ref 7.5–12.5)
Monocytes Absolute: 700 cells/uL (ref 200–950)
NEUTROS PCT: 61 %
Neutro Abs: 4270 cells/uL (ref 1500–7800)
PLATELETS: 154 10*3/uL (ref 140–400)
RBC: 4.65 MIL/uL (ref 4.20–5.80)
RDW: 13.6 % (ref 11.0–15.0)
WBC: 7 10*3/uL (ref 3.8–10.8)

## 2016-09-22 LAB — LIPID PANEL
CHOLESTEROL: 93 mg/dL (ref <200)
HDL: 47 mg/dL (ref >40)
LDL Cholesterol: 35 mg/dL (ref <100)
TRIGLYCERIDES: 57 mg/dL (ref <150)
Total CHOL/HDL Ratio: 2 Ratio (ref <5.0)
VLDL: 11 mg/dL (ref <30)

## 2016-09-23 LAB — VITAMIN B12: Vitamin B-12: 867 pg/mL (ref 200–1100)

## 2016-12-10 IMAGING — CR DG CHEST 2V
1 series · 2 of 2 positions shown · non-contrast
Comparison: CT 04/21/2015.

CLINICAL DATA: COPD.  Smoking history .

EXAM:
CHEST  2 VIEW

[Series 1: dg chest 2 view · 0.14mm/px · 2 of 2 slices shown]
[im 1/2]
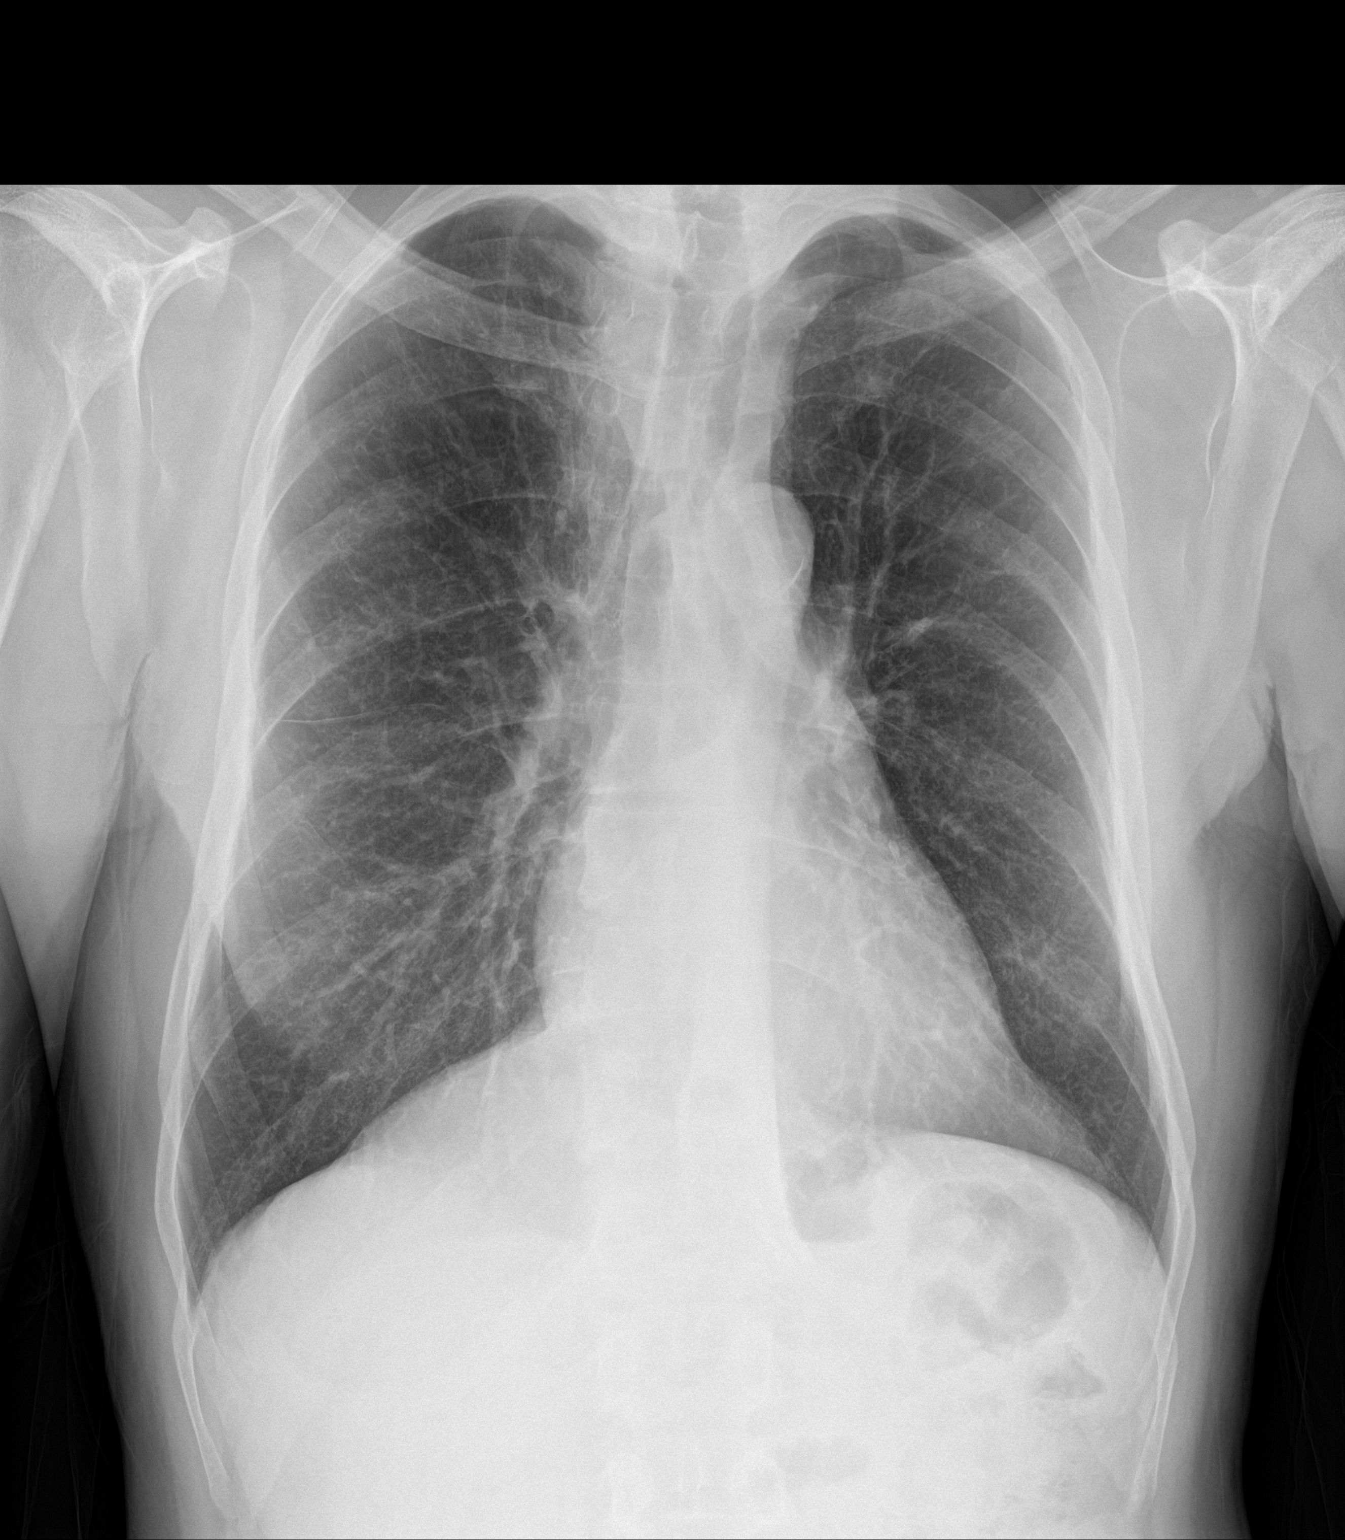
[im 2/2]
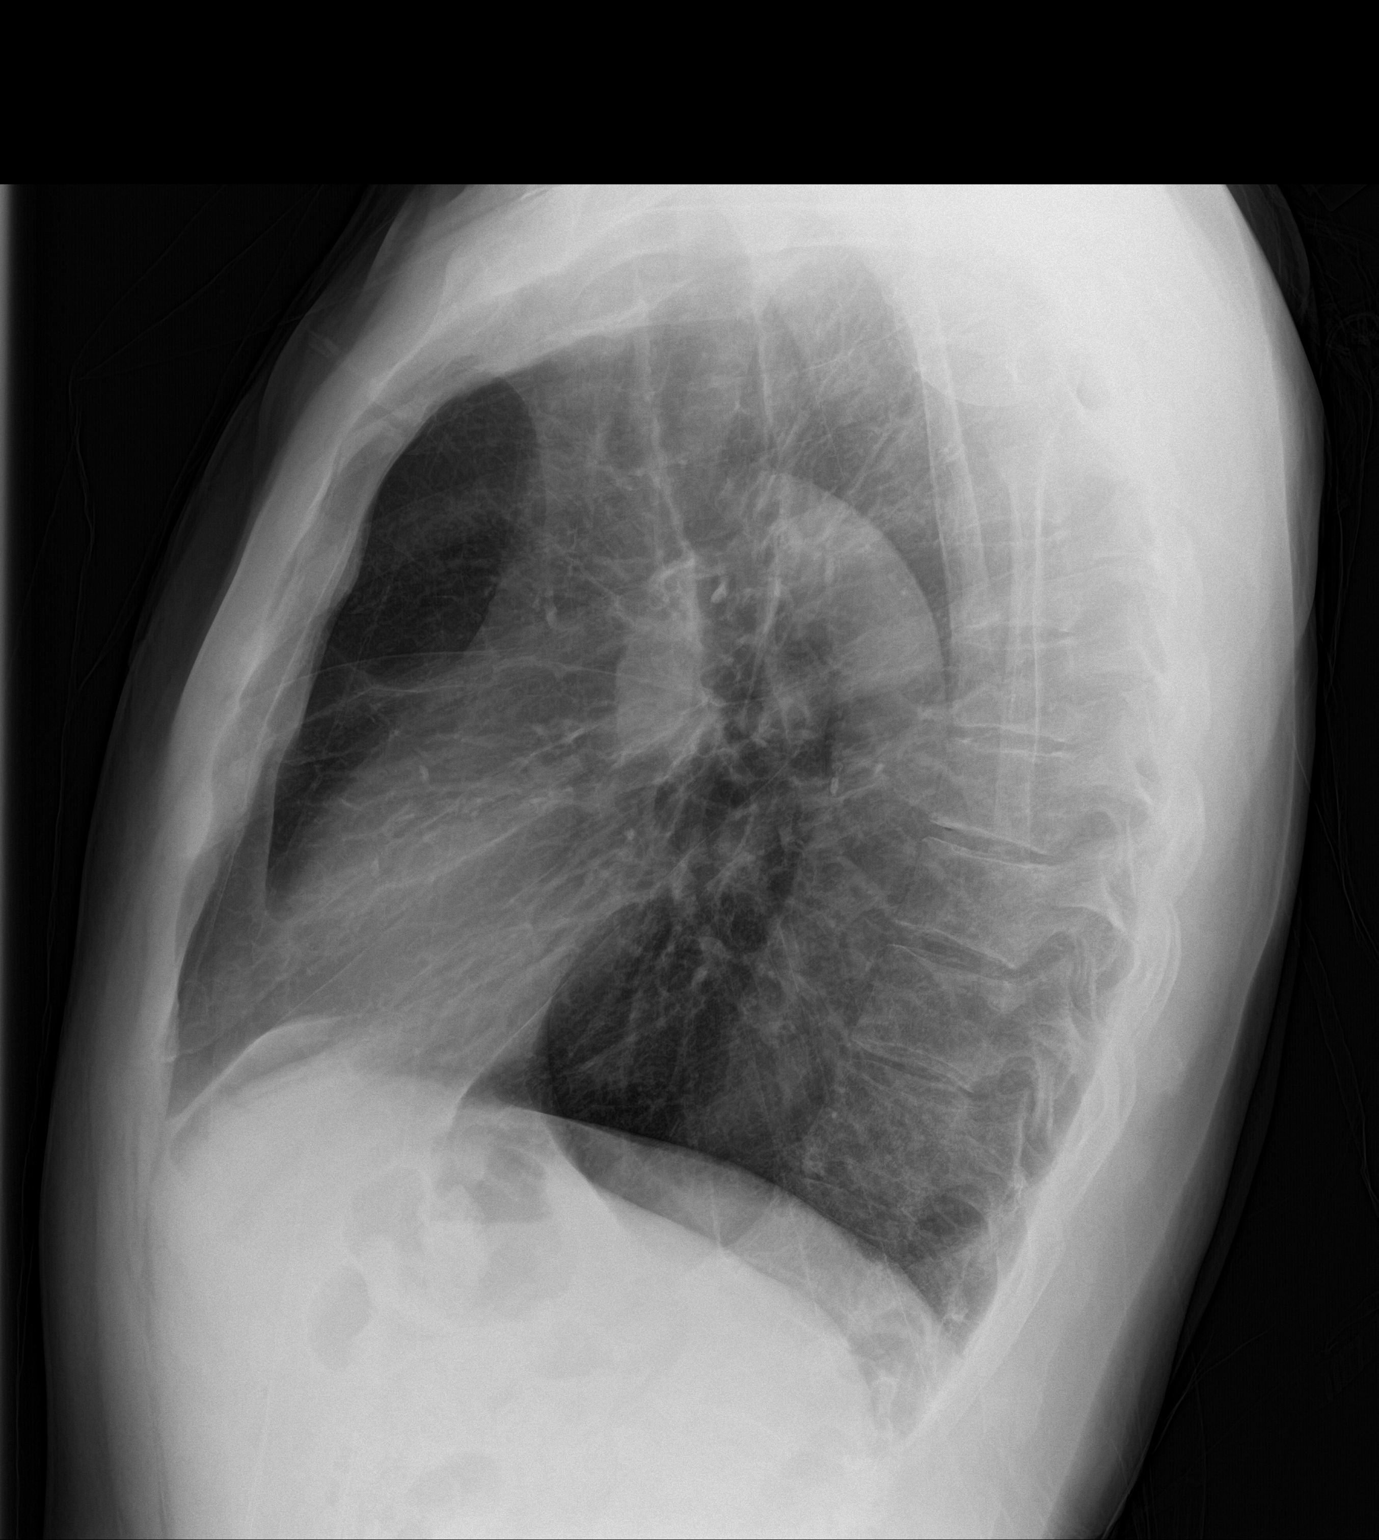

[2 of 2 positions shown; findings below may reference images not displayed]

FINDINGS: Mediastinum and hilar structures normal. Lungs are clear. No pleural
effusion or pneumothorax. Heart size normal.
IMPRESSION: No acute cardiopulmonary disease.

## 2017-01-22 ENCOUNTER — Encounter: Payer: Self-pay | Admitting: Family Medicine

## 2017-01-22 ENCOUNTER — Ambulatory Visit (INDEPENDENT_AMBULATORY_CARE_PROVIDER_SITE_OTHER): Payer: Medicare Other | Admitting: Family Medicine

## 2017-01-22 VITALS — BP 130/80 | HR 81 | Resp 14 | Ht 71.0 in | Wt 159.0 lb

## 2017-01-22 DIAGNOSIS — G63 Polyneuropathy in diseases classified elsewhere: Secondary | ICD-10-CM | POA: Diagnosis not present

## 2017-01-22 DIAGNOSIS — E785 Hyperlipidemia, unspecified: Secondary | ICD-10-CM | POA: Diagnosis not present

## 2017-01-22 DIAGNOSIS — I10 Essential (primary) hypertension: Secondary | ICD-10-CM

## 2017-01-22 DIAGNOSIS — N183 Chronic kidney disease, stage 3 unspecified: Secondary | ICD-10-CM

## 2017-01-22 DIAGNOSIS — N401 Enlarged prostate with lower urinary tract symptoms: Secondary | ICD-10-CM

## 2017-01-22 DIAGNOSIS — I2510 Atherosclerotic heart disease of native coronary artery without angina pectoris: Secondary | ICD-10-CM

## 2017-01-22 DIAGNOSIS — J449 Chronic obstructive pulmonary disease, unspecified: Secondary | ICD-10-CM

## 2017-01-22 DIAGNOSIS — K219 Gastro-esophageal reflux disease without esophagitis: Secondary | ICD-10-CM | POA: Diagnosis not present

## 2017-01-22 DIAGNOSIS — I700 Atherosclerosis of aorta: Secondary | ICD-10-CM

## 2017-01-22 DIAGNOSIS — Z8719 Personal history of other diseases of the digestive system: Secondary | ICD-10-CM

## 2017-01-22 DIAGNOSIS — F1021 Alcohol dependence, in remission: Secondary | ICD-10-CM | POA: Diagnosis not present

## 2017-01-22 DIAGNOSIS — Z8711 Personal history of peptic ulcer disease: Secondary | ICD-10-CM

## 2017-01-22 DIAGNOSIS — Z23 Encounter for immunization: Secondary | ICD-10-CM | POA: Diagnosis not present

## 2017-01-22 DIAGNOSIS — R350 Frequency of micturition: Secondary | ICD-10-CM

## 2017-01-22 DIAGNOSIS — E538 Deficiency of other specified B group vitamins: Secondary | ICD-10-CM

## 2017-01-22 MED ORDER — TAMSULOSIN HCL 0.4 MG PO CAPS: 0.4000 mg | ORAL_CAPSULE | Freq: Every day | ORAL | 2 refills | Status: DC

## 2017-01-22 MED ORDER — GABAPENTIN 300 MG PO CAPS: 300.0000 mg | ORAL_CAPSULE | Freq: Two times a day (BID) | ORAL | 1 refills | Status: DC

## 2017-01-22 MED ORDER — METOPROLOL SUCCINATE ER 25 MG PO TB24: 25.0000 mg | ORAL_TABLET | Freq: Every day | ORAL | 1 refills | Status: DC

## 2017-01-22 MED ORDER — ATORVASTATIN CALCIUM 40 MG PO TABS: 40.0000 mg | ORAL_TABLET | Freq: Every day | ORAL | 1 refills | Status: DC

## 2017-01-22 MED ORDER — PANTOPRAZOLE SODIUM 40 MG PO TBEC: 40.0000 mg | DELAYED_RELEASE_TABLET | Freq: Every day | ORAL | 1 refills | Status: DC

## 2017-01-22 MED ORDER — UMECLIDINIUM-VILANTEROL 62.5-25 MCG/INH IN AEPB: 1.0000 | INHALATION_SPRAY | Freq: Every day | RESPIRATORY_TRACT | 3 refills | Status: DC

## 2017-01-22 NOTE — Progress Notes (Signed)
Name: Kevin Roberts   MRN: [id number]    DOB: 1945-12-13   Date:01/22/2017       Progress Note  Subjective  Chief Complaint  Chief Complaint  Patient presents with  . COPD  . Hypertension  . Gastroesophageal Reflux    HPI  COPD Moderate: he is taking Spiriva, he states no longer coughing,  he has SOB with mild activity. No wheezing. He still smokes 1.5 packs daily, CT chest showed emphysema, he does not want to have spirometry today  History of Alcoholism: he used to drink before retirement - about 2 shots at night, however when he retired at age 17 he started to drink about 1/5 of liquor every two days. He states he stopped drinking after two years because he fell on his face, developed a gastric ulcer and B12 deficiency. He is doing well since he stopped drinking around 2010.  HTN: taking medication, he denies dizziness, but feels tired, he has occasional chest pain about once a month - lasts about 15 minutes resolves with deep breaths, advised to try NTG next time ( he thinks it is secondary to reflux) , occasionally has palpitation when he is upset  GERD and history of gastric ulcer:  He states symptoms controlled as long as he takes a PPI, no heartburn or regurgitation.   Hyperlipidemia: taking Atorvastatin and denies side effects. Taking aspirin daily . Denies myalgia.   CAD: found on CT chest 3 vessel disease he denies chest pain ,it may be multifactorial from COPD also. He was seen by Dr. Fletcher Anon and advised to quit smoking. On aspirin, statins and beta-blocker. Cardiac cath in 2017 showed mild to moderate non-obstructive disease and is only on medical management. He saw Dr. Fletcher Anon in May, he has noticed occasional chest pain, but not using NTG lately   B12 neuropathy: he is taking B12 supplementation and also takes Neurontin - symptoms are stable, he has occasional flares  Tobacco Abuse: he has been a smoker for 41 years, he is up to date with triple A screening, negative  in 2016, chest CT done in 11/2013 and repeat CT done 04/21/2015. Explained importance of quitting smoking , he states he usually drinks while at home and stressed with grandchildren.   Atherosclerosis abdominal aorta: on medical management, aspirin, beta-blocker and statin therapy. Needs to quit smoking   Patient Active Problem List   Diagnosis Date Noted  . History of alcoholism ([place]) 01/18/2016  . Gynecomastia 10/14/2015  . Abnormal nuclear stress test   . Anginal equivalent ([place])   . Atherosclerosis of aorta ([place]) 04/15/2015  . BPH with obstruction/lower urinary tract symptoms 02/03/2015  . Renal cyst, left 10/13/2014  . Tobacco use   . B12 neuropathy ([place]) 10/04/2014  . CAD, multiple vessel 10/04/2014  . Benign essential HTN 10/04/2014  . Chronic kidney disease (CKD), stage III (moderate) ([place]) 10/04/2014  . Dyslipidemia 10/04/2014  . COPD, mild ([place]) 10/04/2014  . Gastro-esophageal reflux disease without esophagitis 10/04/2014  . Enlarged prostate 10/04/2014  . Vitamin D deficiency 10/04/2014    Past Surgical History:  Procedure Laterality Date  . CARDIAC CATHETERIZATION N/A 07/02/2015   Procedure: Left Heart Cath and Coronary Angiography;  Surgeon: Wellington Hampshire, MD;  Location: [facility];  Service: Cardiovascular;  Laterality: N/A;  . throat biopsy     lump removed    Family History  Problem Relation Age of Onset  . Hypertension Mother   . CVA Mother   . Cancer Father  Lung  . Mental illness Brother        1-Schizophrenia  . GER disease Son   . Heart Problems Brother     Social History   Social History  . Marital status: Married    Spouse name: N/A  . Number of children: N/A  . Years of education: N/A   Occupational History  . Not on file.   Social History Main Topics  . Smoking status: Current Every Day Smoker    Packs/day: 1.00    Years: 41.00    Types: Cigarettes    Start date: 10/04/1973  . Smokeless tobacco: Never Used      Comment: pack a day  . Alcohol use No  . Drug use: No  . Sexual activity: No   Other Topics Concern  . Not on file   Social History Narrative  . No narrative on file     Current Outpatient Prescriptions:  .  aspirin 81 MG tablet, Take 1 tablet by mouth daily., Disp: , Rfl:  .  Cyanocobalamin (B-12) 1000 MCG TBCR, Take 1 tablet by mouth daily., Disp: , Rfl:  .  MULTIPLE VITAMIN PO, Take 1 tablet by mouth daily., Disp: , Rfl:  .  nitroGLYCERIN (NITROSTAT) 0.4 MG SL tablet, Place 1 tablet (0.4 mg total) under the tongue every 5 (five) minutes as needed for chest pain., Disp: 50 tablet, Rfl: 0 .  tiotropium (SPIRIVA HANDIHALER) 18 MCG inhalation capsule, INHALE ONE DOSE BY MOUTH ONCE DAILY FOR COPD., Disp: 30 capsule, Rfl: 5 .  Vitamin D, Cholecalciferol, 1000 UNITS CAPS, Take 1 tablet by mouth daily., Disp: , Rfl:  .  atorvastatin (LIPITOR) 40 MG tablet, Take 1 tablet (40 mg total) by mouth daily., Disp: 90 tablet, Rfl: 1 .  gabapentin (NEURONTIN) 300 MG capsule, Take 1 capsule (300 mg total) by mouth 2 (two) times daily., Disp: 180 capsule, Rfl: 1 .  metoprolol succinate (TOPROL-XL) 25 MG 24 hr tablet, Take 1 tablet (25 mg total) by mouth daily., Disp: 90 tablet, Rfl: 1 .  pantoprazole (PROTONIX) 40 MG tablet, Take 1 tablet (40 mg total) by mouth daily., Disp: 90 tablet, Rfl: 1 .  tamsulosin (FLOMAX) 0.4 MG CAPS capsule, Take 1 capsule (0.4 mg total) by mouth daily., Disp: 90 capsule, Rfl: 2 .  umeclidinium-vilanterol (ANORO ELLIPTA) 62.5-25 MCG/INH AEPB, Inhale 1 puff into the lungs daily., Disp: 60 each, Rfl: 3  No Known Allergies   ROS  Constitutional: Negative for fever or weight change.  Respiratory: Positive  for cough and shortness of breath.   Cardiovascular: Negative for chest pain or palpitations.  Gastrointestinal: Negative for abdominal pain, no bowel changes.  Musculoskeletal: Negative for gait problem or joint swelling.  Skin: Negative for rash.  Neurological:  Negative for dizziness or headache.  No other specific complaints in a complete review of systems (except as listed in HPI above).  Objective  Vitals:   01/22/17 1022  BP: 130/80  Pulse: 81  Resp: 14  SpO2: 98%  Weight: 159 lb (72.1 kg)  Height: 5\' 11"  (1.803 m)    Body mass index is 22.18 kg/m.  Physical Exam  Constitutional: Patient appears well-developed and thin. .No distress.  HEENT: head atraumatic, normocephalic, pupils equal and reactive to light,  neck supple, throat within normal limits Cardiovascular: Normal rate, regular rhythm and normal heart sounds.  No murmur heard. No BLE edema. Pulmonary/Chest: Effort normal and breath sounds normal. No respiratory distress.  Abdominal: Soft.  There is no tenderness.  Psychiatric: Patient has a normal mood and affect. behavior is normal. Judgment and thought content normal.   PHQ2/9: Depression screen [facility] 2/9 09/20/2016 05/22/2016 01/18/2016 10/14/2015 04/15/2015  Decreased Interest 0 0 0 0 0  Down, Depressed, Hopeless 0 0 0 0 0  PHQ - 2 Score 0 0 0 0 0    Fall Risk: Fall Risk  01/22/2017 09/20/2016 05/22/2016 01/18/2016 10/14/2015  Falls in the past year? No No No No No     Assessment & Plan  1. GERD without esophagitis  - pantoprazole (PROTONIX) 40 MG tablet; Take 1 tablet (40 mg total) by mouth daily.  Dispense: 90 tablet; Refill: 1  2. Dyslipidemia  - atorvastatin (LIPITOR) 40 MG tablet; Take 1 tablet (40 mg total) by mouth daily.  Dispense: 90 tablet; Refill: 1  3. B12 neuropathy (HCC)  - gabapentin (NEURONTIN) 300 MG capsule; Take 1 capsule (300 mg total) by mouth 2 (two) times daily.  Dispense: 180 capsule; Refill: 1  4. Atherosclerosis of aorta (HCC)  - metoprolol succinate (TOPROL-XL) 25 MG 24 hr tablet; Take 1 tablet (25 mg total) by mouth daily.  Dispense: 90 tablet; Refill: 1  5. History of gastric ulcer  - pantoprazole (PROTONIX) 40 MG tablet; Take 1 tablet (40 mg total) by mouth daily.  Dispense: 90  tablet; Refill: 1  6. COPD, moderate ([place])  Still smoking 1.5 packs daily, we will change from spiriva to [place] - umeclidinium-vilanterol (ANORO ELLIPTA) 62.5-25 MCG/INH AEPB; Inhale 1 puff into the lungs daily.  Dispense: 60 each; Refill: 3  7. Need for immunization against influenza  - Flu Vaccine QUAD 36+ mos IM  8. Benign essential HTN  - metoprolol succinate (TOPROL-XL) 25 MG 24 hr tablet; Take 1 tablet (25 mg total) by mouth daily.  Dispense: 90 tablet; Refill: 1  9. Chronic kidney disease (CKD), stage III (moderate) (HCC)  GFR stable  10. History of alcoholism ([place])  Off alcohol  11. Benign prostatic hyperplasia with urinary frequency  - tamsulosin (FLOMAX) 0.4 MG CAPS capsule; Take 1 capsule (0.4 mg total) by mouth daily.  Dispense: 90 capsule; Refill: 2

## 2017-01-24 ENCOUNTER — Other Ambulatory Visit: Payer: Self-pay

## 2017-01-24 DIAGNOSIS — N401 Enlarged prostate with lower urinary tract symptoms: Secondary | ICD-10-CM

## 2017-01-25 ENCOUNTER — Other Ambulatory Visit: Payer: Medicare Other

## 2017-01-25 DIAGNOSIS — N401 Enlarged prostate with lower urinary tract symptoms: Secondary | ICD-10-CM

## 2017-01-26 LAB — PSA: PROSTATE SPECIFIC AG, SERUM: 1.6 ng/mL (ref 0.0–4.0)

## 2017-01-30 NOTE — Progress Notes (Signed)
02/01/2017 9:13 AM   Kevin Roberts 08-27-1945 [id number]  Referring provider: Steele Sizer, MD [address] [place], [place] [address]  Chief Complaint  Patient presents with  . Follow-up  . Benign Prostatic Hypertrophy    HPI: Patient is a 71 year old African-American male who presents today for his yearly office visit for BPH with LUTS managed with tamsulosin.  BPH WITH LUTS His IPSS score today is 3, which is mild lower urinary tract symptomatology. He is pleased with his quality life due to his urinary symptoms.  His previous IPSS score was 1/0.  His previous PVR is 27 mL.   He denies any dysuria, hematuria or suprapubic pain.  He currently taking tamsulosin 0.4 mg daily.  He also denies any recent fevers, chills, nausea or vomiting.   He does not have a family history of PCa.      IPSS    Row Name 02/01/17 0900         International Prostate Symptom Score   How often have you had the sensation of not emptying your bladder? Less than 1 in 5     How often have you had to urinate less than every two hours? Less than 1 in 5 times     How often have you found you stopped and started again several times when you urinated? Less than 1 in 5 times     How often have you found it difficult to postpone urination? Not at All     How often have you had a weak urinary stream? Not at All     How often have you had to strain to start urination? Not at All     How many times did you typically get up at night to urinate? None     Total IPSS Score 3       Quality of Life due to urinary symptoms   If you were to spend the rest of your life with your urinary condition just the way it is now how would you feel about that? Pleased        Score:  1-7 Mild 8-19 Moderate 20-35 Severe     PMH: Past Medical History:  Diagnosis Date  . Benign prostatic hypertrophy   . CAD (coronary atherosclerotic disease)   . Chronic kidney disease, stage III (moderate) (HCC)   .  COPD (chronic obstructive pulmonary disease) ([place])   . Cramps, muscle, general   . Elevated ferritin   . GERD (gastroesophageal reflux disease)   . History of hypercalcemia   . Hyperlipidemia   . Hypertension   . Incomplete bladder emptying   . Nocturia   . Peripheral polyneuropathy   . Rising PSA level   . Tobacco use   . Urgency of micturation   . Vitamin B 12 deficiency     Surgical History: Past Surgical History:  Procedure Laterality Date  . CARDIAC CATHETERIZATION N/A 07/02/2015   Procedure: Left Heart Cath and Coronary Angiography;  Surgeon: Wellington Hampshire, MD;  Location: [facility];  Service: Cardiovascular;  Laterality: N/A;  . throat biopsy     lump removed    Home Medications:  Allergies as of 02/01/2017   No Known Allergies     Medication List       Accurate as of 02/01/17  9:13 AM. Always use your most recent med list.          aspirin 81 MG tablet Take 1 tablet by mouth  daily.   atorvastatin 40 MG tablet Commonly known as:  LIPITOR Take 1 tablet (40 mg total) by mouth daily.   B-12 1000 MCG Tbcr Take 1 tablet by mouth daily.   gabapentin 300 MG capsule Commonly known as:  NEURONTIN Take 1 capsule (300 mg total) by mouth 2 (two) times daily.   metoprolol succinate 25 MG 24 hr tablet Commonly known as:  TOPROL-XL Take 1 tablet (25 mg total) by mouth daily.   MULTIPLE VITAMIN PO Take 1 tablet by mouth daily.   nitroGLYCERIN 0.4 MG SL tablet Commonly known as:  NITROSTAT Place 1 tablet (0.4 mg total) under the tongue every 5 (five) minutes as needed for chest pain.   pantoprazole 40 MG tablet Commonly known as:  PROTONIX Take 1 tablet (40 mg total) by mouth daily.   tamsulosin 0.4 MG Caps capsule Commonly known as:  FLOMAX Take 1 capsule (0.4 mg total) by mouth daily.   umeclidinium-vilanterol 62.5-25 MCG/INH Aepb Commonly known as:  ANORO ELLIPTA Inhale 1 puff into the lungs daily.   Vitamin D (Cholecalciferol) 1000  units Caps Take 1 tablet by mouth daily.       Allergies: No Known Allergies  Family History: Family History  Problem Relation Age of Onset  . Hypertension Mother   . CVA Mother   . Cancer Father        Lung  . Mental illness Brother        1-Schizophrenia  . GER disease Son   . Heart Problems Brother     Social History:  reports that he has been smoking Cigarettes.  He started smoking about 43 years ago. He has a 41.00 pack-year smoking history. He has never used smokeless tobacco. He reports that he does not drink alcohol or use drugs.  ROS: UROLOGY Frequent Urination?: No Hard to postpone urination?: No Burning/pain with urination?: No Get up at night to urinate?: No Leakage of urine?: No Urine stream starts and stops?: No Trouble starting stream?: No Do you have to strain to urinate?: No Blood in urine?: No Urinary tract infection?: No Sexually transmitted disease?: No Injury to kidneys or bladder?: No Painful intercourse?: No Weak stream?: No Erection problems?: No Penile pain?: No  Gastrointestinal Nausea?: No Vomiting?: No Indigestion/heartburn?: No Diarrhea?: No Constipation?: No  Constitutional Fever: No Night sweats?: No Weight loss?: No Fatigue?: No  Skin Skin rash/lesions?: No Itching?: No  Eyes Blurred vision?: No Double vision?: No  Ears/Nose/Throat Sore throat?: No Sinus problems?: No  Hematologic/Lymphatic Swollen glands?: No Easy bruising?: No  Cardiovascular Leg swelling?: No Chest pain?: No  Respiratory Cough?: No Shortness of breath?: No  Endocrine Excessive thirst?: No  Musculoskeletal Back pain?: No Joint pain?: No  Neurological Headaches?: No Dizziness?: No  Psychologic Depression?: No Anxiety?: No  Physical Exam: BP (!) 149/76   Pulse 69   Ht 5\' 11"  (1.803 m)   Wt 159 lb (72.1 kg)   BMI 22.18 kg/m   Constitutional: Well nourished. Alert and oriented, No acute distress. HEENT: [place] AT, moist  mucus membranes. Trachea midline, no masses. Cardiovascular: No clubbing, cyanosis, or edema. Respiratory: Normal respiratory effort, no increased work of breathing. GI: Abdomen is soft, non tender, non distended, no abdominal masses. Liver and spleen not palpable.  No hernias appreciated.  Stool sample for occult testing is not indicated.   GU: No CVA tenderness.  No bladder fullness or masses.  Patient with circumcised phallus.  Urethral meatus is patent.  No penile discharge. No  penile lesions or rashes. Scrotum without lesions, cysts, rashes and/or edema.  Testicles are located scrotally bilaterally. No masses are appreciated in the testicles. Left and right epididymis are normal. Rectal: Patient with  normal sphincter tone. Anus and perineum without scarring or rashes. No rectal masses are appreciated. Prostate is approximately 55 grams, no nodules are appreciated. Seminal vesicles are normal. Skin: No rashes, bruises or suspicious lesions. Lymph: No cervical or inguinal adenopathy. Neurologic: Grossly intact, no focal deficits, moving all 4 extremities. Psychiatric: Normal mood and affect.    Laboratory Data: Lab Results  Component Value Date   CREATININE 1.59 (H) 09/20/2016   Lab Results  Component Value Date   PSA 2.0 09/23/2013   PSA history:  1.8 ng/mL on 02/02/2014  2.1 ng/mL on 01/27/2016  1.6 ng/mL on 01/25/2017  1. BPH with LUTS  - IPSS score is 3/1, it is worsening  - Continue conservative management, avoiding bladder irritants and timed voiding's  - Continue tamsulosin 0.4 mg daily  - RTC in 12 months for IPSS, PSA and exam   Return in about 1 year (around 02/01/2018) for IPSS, PSA and exam.  Zara Council, [facility]  [place] [address], [place], [place] [address] [phone]

## 2017-02-01 ENCOUNTER — Encounter: Payer: Self-pay | Admitting: Urology

## 2017-02-01 ENCOUNTER — Ambulatory Visit (INDEPENDENT_AMBULATORY_CARE_PROVIDER_SITE_OTHER): Payer: Medicare Other | Admitting: Urology

## 2017-02-01 VITALS — BP 149/76 | HR 69 | Ht 71.0 in | Wt 159.0 lb

## 2017-02-01 DIAGNOSIS — N138 Other obstructive and reflux uropathy: Secondary | ICD-10-CM | POA: Diagnosis not present

## 2017-02-01 DIAGNOSIS — N401 Enlarged prostate with lower urinary tract symptoms: Secondary | ICD-10-CM | POA: Diagnosis not present

## 2017-02-01 DIAGNOSIS — R350 Frequency of micturition: Secondary | ICD-10-CM | POA: Diagnosis not present

## 2017-02-01 DIAGNOSIS — I2510 Atherosclerotic heart disease of native coronary artery without angina pectoris: Secondary | ICD-10-CM | POA: Diagnosis not present

## 2017-02-01 MED ORDER — TAMSULOSIN HCL 0.4 MG PO CAPS: 0.4000 mg | ORAL_CAPSULE | Freq: Every day | ORAL | 2 refills | Status: DC

## 2017-02-01 NOTE — Addendum Note (Signed)
Addended by: Lestine Box on: 02/01/2017 09:27 AM   Modules accepted: Orders

## 2017-05-25 ENCOUNTER — Ambulatory Visit (INDEPENDENT_AMBULATORY_CARE_PROVIDER_SITE_OTHER): Payer: Medicare Other | Admitting: Family Medicine

## 2017-05-25 ENCOUNTER — Encounter: Payer: Self-pay | Admitting: Family Medicine

## 2017-05-25 VITALS — BP 128/78 | HR 65 | Temp 98.2°F | Resp 18 | Ht 71.0 in | Wt 165.1 lb

## 2017-05-25 DIAGNOSIS — Z8719 Personal history of other diseases of the digestive system: Secondary | ICD-10-CM

## 2017-05-25 DIAGNOSIS — E785 Hyperlipidemia, unspecified: Secondary | ICD-10-CM | POA: Diagnosis not present

## 2017-05-25 DIAGNOSIS — I10 Essential (primary) hypertension: Secondary | ICD-10-CM

## 2017-05-25 DIAGNOSIS — N183 Chronic kidney disease, stage 3 unspecified: Secondary | ICD-10-CM

## 2017-05-25 DIAGNOSIS — K219 Gastro-esophageal reflux disease without esophagitis: Secondary | ICD-10-CM

## 2017-05-25 DIAGNOSIS — I208 Other forms of angina pectoris: Secondary | ICD-10-CM | POA: Diagnosis not present

## 2017-05-25 DIAGNOSIS — J449 Chronic obstructive pulmonary disease, unspecified: Secondary | ICD-10-CM

## 2017-05-25 DIAGNOSIS — I700 Atherosclerosis of aorta: Secondary | ICD-10-CM

## 2017-05-25 DIAGNOSIS — G63 Polyneuropathy in diseases classified elsewhere: Secondary | ICD-10-CM

## 2017-05-25 DIAGNOSIS — F1021 Alcohol dependence, in remission: Secondary | ICD-10-CM

## 2017-05-25 DIAGNOSIS — E538 Deficiency of other specified B group vitamins: Secondary | ICD-10-CM | POA: Diagnosis not present

## 2017-05-25 DIAGNOSIS — I2510 Atherosclerotic heart disease of native coronary artery without angina pectoris: Secondary | ICD-10-CM | POA: Diagnosis not present

## 2017-05-25 DIAGNOSIS — Z8711 Personal history of peptic ulcer disease: Secondary | ICD-10-CM

## 2017-05-25 MED ORDER — GABAPENTIN 300 MG PO CAPS: 300.0000 mg | ORAL_CAPSULE | Freq: Two times a day (BID) | ORAL | 1 refills | Status: DC

## 2017-05-25 MED ORDER — METOPROLOL SUCCINATE ER 25 MG PO TB24: 25.0000 mg | ORAL_TABLET | Freq: Every day | ORAL | 1 refills | Status: DC

## 2017-05-25 MED ORDER — NITROGLYCERIN 0.4 MG SL SUBL: 0.4000 mg | SUBLINGUAL_TABLET | SUBLINGUAL | 0 refills | Status: DC | PRN

## 2017-05-25 MED ORDER — ATORVASTATIN CALCIUM 40 MG PO TABS: 40.0000 mg | ORAL_TABLET | Freq: Every day | ORAL | 1 refills | Status: DC

## 2017-05-25 MED ORDER — PANTOPRAZOLE SODIUM 40 MG PO TBEC: 40.0000 mg | DELAYED_RELEASE_TABLET | Freq: Every day | ORAL | 1 refills | Status: DC

## 2017-05-25 NOTE — Patient Instructions (Signed)

## 2017-05-25 NOTE — Progress Notes (Signed)
Name: Kevin Roberts   MRN: [id number]    DOB: 09-Mar-1946   Date:05/25/2017       Progress Note  Subjective  Chief Complaint  Chief Complaint  Patient presents with  . Medication Refill    4 month F/U  . COPD    Unchanged  . Hypertension    Denies any symptoms  . Gastroesophageal Reflux    Only when he eats something spicy-medication still controlling symptoms  . Hyperlipidemia    HPI  COPD Mild : he is taking Spiriva, he states no longer coughing, he also denies SOB at this time. No wheezing. He still smokes 1.5 packs daily, CT chest showed emphysema, spirometry today was normal. He is using Anoro and is doing better than when he used Spiriva  History of Alcoholism: he used to drink before retirement - about 2 shots at night, however when he retired at age 5 he started to drink about 1/5 of liquor every two days. He states he stopped drinking after two years because he fell on his face, developed a gastric ulcer and B12 deficiency. He is doing well since he stopped drinking around 2010. He still has some tingling on finger tips and legs. Still taking B12  HTN: taking medication, he denies dizziness, but feels tired, he has occasional chest pain about once a month - lasts about 15 minutes resolves with deep breaths, he states last episode was last week, and lasted about 2 hours, he did not use his NTG or Tums. Explained he should try it next time, I will send a refill.   GERD and history of gastric ulcer: He states symptoms controlled as long as he takes a PPI, no heartburn or regurgitation. However had right upper quadrant and epigastric pain .   Hyperlipidemia: taking Atorvastatin and denies side effects. Taking aspirin daily . Denies myalgia.   CAD: found on CT chest 3 vessel disease he denies chest pain ,it may be multifactorial from COPD also. He was seen by Dr. Fletcher Anon and advised to quit smoking. On aspirin, statins and beta-blocker. Cardiac cath in 2017 showed mild to  moderate non-obstructive disease and is only on medical management. He saw Dr. Fletcher Anon in May, he has noticed occasional chest pain, he needs a refill of NTG, he does not use it , but was advised to use prn for chest pain.   B12 neuropathy: he is taking B12 supplementation and also takes Neurontin - symptoms are stable, he has occasional flares  Tobacco Abuse: he has been a smoker for 41 years, he is up to date with triple A screening, negative in 2016, chest CT done in 11/2013 and repeat CT done 04/21/2015. Explained importance of quitting smoking  Atherosclerosis abdominal aorta: on medical management, aspirin, beta-blocker and statin therapy. Needs to quit smoking   Patient Active Problem List   Diagnosis Date Noted  . History of alcoholism ([place]) 01/18/2016  . Gynecomastia 10/14/2015  . Abnormal nuclear stress test   . Anginal equivalent ([place])   . Atherosclerosis of aorta ([place]) 04/15/2015  . BPH with obstruction/lower urinary tract symptoms 02/03/2015  . Renal cyst, left 10/13/2014  . Tobacco use   . B12 neuropathy ([place]) 10/04/2014  . CAD, multiple vessel 10/04/2014  . Benign essential HTN 10/04/2014  . Chronic kidney disease (CKD), stage III (moderate) ([place]) 10/04/2014  . Dyslipidemia 10/04/2014  . COPD, mild ([place]) 10/04/2014  . Gastro-esophageal reflux disease without esophagitis 10/04/2014  . Enlarged prostate 10/04/2014  . Vitamin D deficiency  10/04/2014    Past Surgical History:  Procedure Laterality Date  . CARDIAC CATHETERIZATION N/A 07/02/2015   Procedure: Left Heart Cath and Coronary Angiography;  Surgeon: Wellington Hampshire, MD;  Location: [facility];  Service: Cardiovascular;  Laterality: N/A;  . throat biopsy     lump removed    Family History  Problem Relation Age of Onset  . Hypertension Mother   . CVA Mother   . Cancer Father        Lung  . Mental illness Brother        1-Schizophrenia  . GER disease Son   . Heart Problems Brother      Social History   Socioeconomic History  . Marital status: Married    Spouse name: Not on file  . Number of children: Not on file  . Years of education: Not on file  . Highest education level: Not on file  Social Needs  . Financial resource strain: Not on file  . Food insecurity - worry: Not on file  . Food insecurity - inability: Not on file  . Transportation needs - medical: Not on file  . Transportation needs - non-medical: Not on file  Occupational History  . Not on file  Tobacco Use  . Smoking status: Current Every Day Smoker    Packs/day: 1.00    Years: 41.00    Pack years: 41.00    Types: Cigarettes    Start date: 10/04/1973  . Smokeless tobacco: Never Used  . Tobacco comment: pack a day  Substance and Sexual Activity  . Alcohol use: No    Alcohol/week: 0.0 oz  . Drug use: No  . Sexual activity: Yes    Partners: Female  Other Topics Concern  . Not on file  Social History Narrative  . Not on file     Current Outpatient Medications:  .  aspirin 81 MG tablet, Take 1 tablet by mouth daily., Disp: , Rfl:  .  atorvastatin (LIPITOR) 40 MG tablet, Take 1 tablet (40 mg total) by mouth daily., Disp: 90 tablet, Rfl: 1 .  Cyanocobalamin (B-12) 1000 MCG TBCR, Take 1 tablet by mouth daily., Disp: , Rfl:  .  gabapentin (NEURONTIN) 300 MG capsule, Take 1 capsule (300 mg total) by mouth 2 (two) times daily., Disp: 180 capsule, Rfl: 1 .  metoprolol succinate (TOPROL-XL) 25 MG 24 hr tablet, Take 1 tablet (25 mg total) by mouth daily., Disp: 90 tablet, Rfl: 1 .  MULTIPLE VITAMIN PO, Take 1 tablet by mouth daily., Disp: , Rfl:  .  nitroGLYCERIN (NITROSTAT) 0.4 MG SL tablet, Place 1 tablet (0.4 mg total) under the tongue every 5 (five) minutes as needed for chest pain., Disp: 50 tablet, Rfl: 0 .  pantoprazole (PROTONIX) 40 MG tablet, Take 1 tablet (40 mg total) by mouth daily., Disp: 90 tablet, Rfl: 1 .  tamsulosin (FLOMAX) 0.4 MG CAPS capsule, Take 1 capsule (0.4 mg total) by  mouth daily., Disp: 90 capsule, Rfl: 2 .  umeclidinium-vilanterol (ANORO ELLIPTA) 62.5-25 MCG/INH AEPB, Inhale 1 puff into the lungs daily., Disp: 60 each, Rfl: 3 .  Vitamin D, Cholecalciferol, 1000 UNITS CAPS, Take 1 tablet by mouth daily., Disp: , Rfl:   No Known Allergies   ROS   Constitutional: Negative for fever , positive for  weight change.  Respiratory: Negative for cough and shortness of breath.   Cardiovascular: Positive for intermittent  chest pain or palpitations.  Gastrointestinal: Negative for abdominal pain, no bowel changes.  Musculoskeletal: Negative for gait problem or joint swelling.  Skin: Negative for rash.  Neurological: Negative for dizziness or headache.  No other specific complaints in a complete review of systems (except as listed in HPI above).  Objective  Vitals:   05/25/17 1008  BP: 128/78  Pulse: 65  Resp: 18  Temp: 98.2 F (36.8 C)  TempSrc: Oral  SpO2: 95%  Weight: 165 lb 1.6 oz (74.9 kg)  Height: 5\' 11"  (1.803 m)    Body mass index is 23.03 kg/m.  Physical Exam  Constitutional: Patient appears well-developed and well-nourished. Very thin.  No distress.  HEENT: head atraumatic, normocephalic, pupils equal and reactive to light,  neck supple, throat within normal limits Cardiovascular: Normal rate, regular rhythm and normal heart sounds.  No murmur heard. No BLE edema. Pulmonary/Chest: Effort normal and breath sounds normal. No respiratory distress. Abdominal: Soft.  There is no tenderness. Psychiatric: Patient has a normal mood and affect. behavior is normal. Judgment and thought content normal.  PHQ2/9: Depression screen [facility] 2/9 05/25/2017 09/20/2016 05/22/2016 01/18/2016 10/14/2015  Decreased Interest 0 0 0 0 0  Down, Depressed, Hopeless 0 0 0 0 0  PHQ - 2 Score 0 0 0 0 0    Fall Risk: Fall Risk  05/25/2017 01/22/2017 09/20/2016 05/22/2016 01/18/2016  Falls in the past year? No No No No No    Functional Status Survey: Is the patient  deaf or have difficulty hearing?: No Does the patient have difficulty seeing, even when wearing glasses/contacts?: No Does the patient have difficulty concentrating, remembering, or making decisions?: No Does the patient have difficulty walking or climbing stairs?: No Does the patient have difficulty dressing or bathing?: No Does the patient have difficulty doing errands alone such as visiting a doctor's office or shopping?: No    Assessment & Plan  1. COPD, mild ([place])  - Spirometry with Graph  2. Dyslipidemia  - atorvastatin (LIPITOR) 40 MG tablet; Take 1 tablet (40 mg total) by mouth daily.  Dispense: 90 tablet; Refill: 1  3. History of gastric ulcer  - pantoprazole (PROTONIX) 40 MG tablet; Take 1 tablet (40 mg total) by mouth daily.  Dispense: 90 tablet; Refill: 1  4. B12 neuropathy (HCC)  - gabapentin (NEURONTIN) 300 MG capsule; Take 1 capsule (300 mg total) by mouth 2 (two) times daily.  Dispense: 180 capsule; Refill: 1  5. GERD without esophagitis  - pantoprazole (PROTONIX) 40 MG tablet; Take 1 tablet (40 mg total) by mouth daily.  Dispense: 90 tablet; Refill: 1  6. Atherosclerosis of aorta (HCC)  - metoprolol succinate (TOPROL-XL) 25 MG 24 hr tablet; Take 1 tablet (25 mg total) by mouth daily.  Dispense: 90 tablet; Refill: 1 - nitroGLYCERIN (NITROSTAT) 0.4 MG SL tablet; Place 1 tablet (0.4 mg total) under the tongue every 5 (five) minutes as needed for chest pain.  Dispense: 30 tablet; Refill: 0  7. Benign essential HTN  - metoprolol succinate (TOPROL-XL) 25 MG 24 hr tablet; Take 1 tablet (25 mg total) by mouth daily.  Dispense: 90 tablet; Refill: 1  8. Chronic kidney disease (CKD), stage III (moderate) ([place])  Recheck labs yearly  9. History of alcoholism ([place])  In remission   10. Anginal equivalent (HCC)  - nitroGLYCERIN (NITROSTAT) 0.4 MG SL tablet; Place 1 tablet (0.4 mg total) under the tongue every 5 (five) minutes as needed for chest pain.  Dispense: 30  tablet; Refill: 0  11. CAD, multiple vessel

## 2017-06-06 ENCOUNTER — Telehealth: Payer: Self-pay | Admitting: *Deleted

## 2017-06-06 DIAGNOSIS — Z122 Encounter for screening for malignant neoplasm of respiratory organs: Secondary | ICD-10-CM

## 2017-06-06 DIAGNOSIS — Z87891 Personal history of nicotine dependence: Secondary | ICD-10-CM

## 2017-06-06 NOTE — Telephone Encounter (Signed)
Notified patient that annual lung cancer screening low dose CT scan is due currently or will be in near future. Confirmed that patient is within the age range of 55-77, and asymptomatic, (no signs or symptoms of lung cancer). Patient denies illness that would prevent curative treatment for lung cancer if found. Verified smoking history, (current, 34.5 pack year). The shared decision making visit was done 11/21/13. Patient is agreeable for CT scan being scheduled.

## 2017-06-14 ENCOUNTER — Ambulatory Visit
Admission: RE | Admit: 2017-06-14 | Discharge: 2017-06-14 | Disposition: A | Discharge status: Home / Self Care | Payer: Medicare Other | Source: Ambulatory Visit | Attending: Nurse Practitioner | Admitting: Nurse Practitioner

## 2017-06-14 DIAGNOSIS — I2510 Atherosclerotic heart disease of native coronary artery without angina pectoris: Secondary | ICD-10-CM | POA: Diagnosis not present

## 2017-06-14 DIAGNOSIS — J439 Emphysema, unspecified: Secondary | ICD-10-CM | POA: Diagnosis not present

## 2017-06-14 DIAGNOSIS — Z87891 Personal history of nicotine dependence: Secondary | ICD-10-CM | POA: Diagnosis not present

## 2017-06-14 DIAGNOSIS — Z122 Encounter for screening for malignant neoplasm of respiratory organs: Secondary | ICD-10-CM | POA: Diagnosis not present

## 2017-06-14 DIAGNOSIS — F17210 Nicotine dependence, cigarettes, uncomplicated: Secondary | ICD-10-CM | POA: Diagnosis not present

## 2017-06-14 DIAGNOSIS — I700 Atherosclerosis of aorta: Secondary | ICD-10-CM | POA: Diagnosis not present

## 2017-06-20 ENCOUNTER — Encounter: Payer: Self-pay | Admitting: *Deleted

## 2017-07-09 ENCOUNTER — Other Ambulatory Visit: Payer: Self-pay

## 2017-07-09 DIAGNOSIS — J449 Chronic obstructive pulmonary disease, unspecified: Secondary | ICD-10-CM

## 2017-07-09 MED ORDER — UMECLIDINIUM-VILANTEROL 62.5-25 MCG/INH IN AEPB: 1.0000 | INHALATION_SPRAY | Freq: Every day | RESPIRATORY_TRACT | 3 refills | Status: DC

## 2017-07-09 NOTE — Telephone Encounter (Signed)
Refill request for general medication: Anoro Ellipta   Last office visit: 05/25/2017  Last physical exam: None indicated  Follow-ups on file. 09/20/2017

## 2017-09-20 ENCOUNTER — Encounter: Payer: Self-pay | Admitting: General Surgery

## 2017-09-20 ENCOUNTER — Ambulatory Visit (INDEPENDENT_AMBULATORY_CARE_PROVIDER_SITE_OTHER): Payer: Medicare Other | Admitting: Family Medicine

## 2017-09-20 ENCOUNTER — Encounter: Payer: Self-pay | Admitting: Family Medicine

## 2017-09-20 VITALS — BP 126/74 | HR 41 | Temp 97.7°F | Resp 16 | Ht 71.0 in | Wt 159.4 lb

## 2017-09-20 DIAGNOSIS — E538 Deficiency of other specified B group vitamins: Secondary | ICD-10-CM | POA: Diagnosis not present

## 2017-09-20 DIAGNOSIS — Z8719 Personal history of other diseases of the digestive system: Secondary | ICD-10-CM | POA: Diagnosis not present

## 2017-09-20 DIAGNOSIS — I2510 Atherosclerotic heart disease of native coronary artery without angina pectoris: Secondary | ICD-10-CM | POA: Diagnosis not present

## 2017-09-20 DIAGNOSIS — K219 Gastro-esophageal reflux disease without esophagitis: Secondary | ICD-10-CM

## 2017-09-20 DIAGNOSIS — R001 Bradycardia, unspecified: Secondary | ICD-10-CM | POA: Diagnosis not present

## 2017-09-20 DIAGNOSIS — N183 Chronic kidney disease, stage 3 unspecified: Secondary | ICD-10-CM

## 2017-09-20 DIAGNOSIS — G63 Polyneuropathy in diseases classified elsewhere: Secondary | ICD-10-CM

## 2017-09-20 DIAGNOSIS — I10 Essential (primary) hypertension: Secondary | ICD-10-CM

## 2017-09-20 DIAGNOSIS — I208 Other forms of angina pectoris: Secondary | ICD-10-CM | POA: Diagnosis not present

## 2017-09-20 DIAGNOSIS — T50905A Adverse effect of unspecified drugs, medicaments and biological substances, initial encounter: Secondary | ICD-10-CM

## 2017-09-20 DIAGNOSIS — Z8711 Personal history of peptic ulcer disease: Secondary | ICD-10-CM

## 2017-09-20 DIAGNOSIS — R0789 Other chest pain: Secondary | ICD-10-CM | POA: Diagnosis not present

## 2017-09-20 DIAGNOSIS — I700 Atherosclerosis of aorta: Secondary | ICD-10-CM | POA: Diagnosis not present

## 2017-09-20 DIAGNOSIS — J449 Chronic obstructive pulmonary disease, unspecified: Secondary | ICD-10-CM

## 2017-09-20 MED ORDER — METOPROLOL SUCCINATE ER 25 MG PO TB24: 12.5000 mg | ORAL_TABLET | Freq: Every day | ORAL | 0 refills | Status: DC

## 2017-09-20 NOTE — Progress Notes (Signed)
Name: Kevin Roberts   MRN: [id number]    DOB: 1945/06/17   Date:09/20/2017       Progress Note  Subjective  Chief Complaint  Chief Complaint  Patient presents with  . Medication Refill  . Hypertension    Denies any symptoms  . COPD    SOB with exertion  . Hyperlipidemia    Cramps in his occasionally  . Gastroesophageal Reflux    Only has acid reflux every once in a while-better with taking medication daily    HPI  COPD Mild : he is taking Anoro,  states no longer coughing, he also denies SOB at this time. No wheezing.He still smokes 1.5 packs daily, CT chest showed emphysema, last spirometry was normal. Continue medication   History of Alcoholism: he used to drink before retirement - about 2 shots at night, however when he retired at age 72 he started to drink about 1/5 of liquor every two days. He states he stopped drinking after two years because he fell on his face, developed a gastric ulcer and B12 deficiency. He is doing well since he stopped drinkingaround 2010. He still has some tingling on finger tips and legs, however better than it was years ago. Still taking B12  HTN: taking medication, he denies dizziness, but feels tired,intermittent chest pain over the past 5 months. Last episode last week, we will send him back to Dr. Fletcher Anon but also to Dr. Fleet Contras for possible repeat EGD  GERD and history of gastric ulcer: He states symptoms controlled as long as he takes a PPI, no heartburn or regurgitation. However had right upper quadrant and epigastric pain, sometimes right upper chest pain .   Hyperlipidemia: taking Atorvastatin and denies side effects. Taking aspirin daily . Denies myalgia. Recheck labs today   CAD: found on CT chest 3 vessel disease he denies chest pain ,it may be multifactorial from COPD also. He was seen by Dr. Fletcher Anon and advised to quit smoking. On aspirin, statins and beta-blocker. Cardiac cath in 2017 showed mild to moderate non-obstructive disease  and is only on medical management. He saw Dr. Fletcher Anon in May 2018 ,he has noticed occasional chest pain that is on the right upper chest , described as sharp sensation that may last 20 minutes, he denies associated SOB or diaphoresis, not related to activity. He has not tried NTG or Tums as suggested on his last visit.   B12 neuropathy: he is taking B12 supplementation and also takes Neurontin - symptoms are stable. Unchanged  Tobacco Abuse: he has been a smoker for 41 years, he is up to date with triple A screening, negative in 2016, chest CT done in 11/2013 and repeat CT done 04/21/2015. Explained importance of quitting smoking again  Atherosclerosis abdominal aorta: on medical management, aspirin, beta-blocker and statin therapy. Needs to quit smoking, reminded him again   Patient Active Problem List   Diagnosis Date Noted  . History of alcoholism ([place]) 01/18/2016  . Gynecomastia 10/14/2015  . Abnormal nuclear stress test   . Anginal equivalent ([place])   . Atherosclerosis of aorta ([place]) 04/15/2015  . BPH with obstruction/lower urinary tract symptoms 02/03/2015  . Renal cyst, left 10/13/2014  . Tobacco use   . B12 neuropathy ([place]) 10/04/2014  . CAD, multiple vessel 10/04/2014  . Benign essential HTN 10/04/2014  . Chronic kidney disease (CKD), stage III (moderate) ([place]) 10/04/2014  . Dyslipidemia 10/04/2014  . COPD, mild ([place]) 10/04/2014  . Gastro-esophageal reflux disease without esophagitis 10/04/2014  .  Enlarged prostate 10/04/2014  . Vitamin D deficiency 10/04/2014    Past Surgical History:  Procedure Laterality Date  . CARDIAC CATHETERIZATION N/A 07/02/2015   Procedure: Left Heart Cath and Coronary Angiography;  Surgeon: Wellington Hampshire, MD;  Location: [place] [facility];  Service: Cardiovascular;  Laterality: N/A;  . throat biopsy     lump removed    Family History  Problem Relation Age of Onset  . Hypertension Mother   . CVA Mother   . Cancer Father        Lung   . Mental illness Brother        1-Schizophrenia  . GER disease Son   . Heart Problems Brother     Social History   Socioeconomic History  . Marital status: Married    Spouse name: Not on file  . Number of children: Not on file  . Years of education: Not on file  . Highest education level: Not on file  Occupational History  . Not on file  Social Needs  . Financial resource strain: Not on file  . Food insecurity:    Worry: Not on file    Inability: Not on file  . Transportation needs:    Medical: Not on file    Non-medical: Not on file  Tobacco Use  . Smoking status: Current Every Day Smoker    Packs/day: 1.00    Years: 41.00    Pack years: 41.00    Types: Cigarettes    Start date: 10/04/1973  . Smokeless tobacco: Never Used  . Tobacco comment: pack a day  Substance and Sexual Activity  . Alcohol use: No    Alcohol/week: 0.0 oz  . Drug use: No  . Sexual activity: Yes    Partners: Female  Lifestyle  . Physical activity:    Days per week: Not on file    Minutes per session: Not on file  . Stress: Not on file  Relationships  . Social connections:    Talks on phone: Not on file    Gets together: Not on file    Attends religious service: Not on file    Active member of club or organization: Not on file    Attends meetings of clubs or organizations: Not on file    Relationship status: Not on file  . Intimate partner violence:    Fear of current or ex partner: Not on file    Emotionally abused: Not on file    Physically abused: Not on file    Forced sexual activity: Not on file  Other Topics Concern  . Not on file  Social History Narrative  . Not on file     Current Outpatient Medications:  .  aspirin 81 MG tablet, Take 1 tablet by mouth daily., Disp: , Rfl:  .  atorvastatin (LIPITOR) 40 MG tablet, Take 1 tablet (40 mg total) by mouth daily., Disp: 90 tablet, Rfl: 1 .  Cyanocobalamin (B-12) 1000 MCG TBCR, Take 1 tablet by mouth daily., Disp: , Rfl:  .   gabapentin (NEURONTIN) 300 MG capsule, Take 1 capsule (300 mg total) by mouth 2 (two) times daily., Disp: 180 capsule, Rfl: 1 .  metoprolol succinate (TOPROL-XL) 25 MG 24 hr tablet, Take 1 tablet (25 mg total) by mouth daily., Disp: 90 tablet, Rfl: 1 .  MULTIPLE VITAMIN PO, Take 1 tablet by mouth daily., Disp: , Rfl:  .  nitroGLYCERIN (NITROSTAT) 0.4 MG SL tablet, Place 1 tablet (0.4 mg total) under the  tongue every 5 (five) minutes as needed for chest pain., Disp: 30 tablet, Rfl: 0 .  pantoprazole (PROTONIX) 40 MG tablet, Take 1 tablet (40 mg total) by mouth daily., Disp: 90 tablet, Rfl: 1 .  tamsulosin (FLOMAX) 0.4 MG CAPS capsule, Take 1 capsule (0.4 mg total) by mouth daily., Disp: 90 capsule, Rfl: 2 .  umeclidinium-vilanterol (ANORO ELLIPTA) 62.5-25 MCG/INH AEPB, Inhale 1 puff into the lungs daily., Disp: 60 each, Rfl: 3 .  Vitamin D, Cholecalciferol, 1000 UNITS CAPS, Take 1 tablet by mouth daily., Disp: , Rfl:   No Known Allergies   ROS  Constitutional: Negative for fever , positive for mild weight change - 6 lbs in the past 4 months.  Respiratory: Negative for cough he has  shortness of breath with activity    Cardiovascular: Negative for chest pain or palpitations.  Gastrointestinal: Negative for abdominal pain, no bowel changes.  Musculoskeletal: Negative for gait problem or joint swelling.  Skin: Negative for rash.  Neurological: Negative for dizziness or headache.  No other specific complaints in a complete review of systems (except as listed in HPI above).  Objective  Vitals:   09/20/17 0926  BP: 126/74  Pulse: (!) 50  Resp: 16  Temp: 97.7 F (36.5 C)  TempSrc: Oral  SpO2: 99%  Weight: 159 lb 6.4 oz (72.3 kg)  Height: 5\' 11"  (1.803 m)    Body mass index is 22.23 kg/m.  Physical Exam  Constitutional: Patient appears well-developed and well-nourished.  No distress.  HEENT: head atraumatic, normocephalic, pupils equal and reactive to light, neck supple, throat  within normal limits Cardiovascular: Bracyardia - 44 during ausculation,  regular rhythm and normal heart sounds.  No murmur heard. No BLE edema. Pulmonary/Chest: Effort normal and breath sounds normal. No respiratory distress. Abdominal: Soft.  There is no tenderness. Psychiatric: Patient has a normal mood and affect. behavior is normal. Judgment and thought content normal.  PHQ2/9: Depression screen [facility] 2/9 09/20/2017 05/25/2017 09/20/2016 05/22/2016 01/18/2016  Decreased Interest 0 0 0 0 0  Down, Depressed, Hopeless 0 0 0 0 0  PHQ - 2 Score 0 0 0 0 0     Fall Risk: Fall Risk  09/20/2017 05/25/2017 01/22/2017 09/20/2016 05/22/2016  Falls in the past year? No No No No No     Functional Status Survey: Is the patient deaf or have difficulty hearing?: No Does the patient have difficulty seeing, even when wearing glasses/contacts?: Yes(glasses) Does the patient have difficulty concentrating, remembering, or making decisions?: No Does the patient have difficulty walking or climbing stairs?: No Does the patient have difficulty dressing or bathing?: No Does the patient have difficulty doing errands alone such as visiting a doctor's office or shopping?: No   Assessment & Plan  1. COPD, mild ([place])  Continue medication   2. B12 neuropathy (HCC)  - Vitamin B12  3. Chronic kidney disease (CKD), stage III (moderate) (HCC)  - COMPLETE METABOLIC PANEL WITH GFR - CBC with Differential/Platelet  4. Benign essential HTN  - COMPLETE METABOLIC PANEL WITH GFR - CBC with Differential/Platelet - Ambulatory referral to Cardiology - metoprolol succinate (TOPROL-XL) 25 MG 24 hr tablet; Take 0.5 tablets (12.5 mg total) by mouth daily.  Dispense: 45 tablet; Refill: 0  5. Atherosclerosis of aorta (HCC)  - Lipid panel - Ambulatory referral to Cardiology - metoprolol succinate (TOPROL-XL) 25 MG 24 hr tablet; Take 0.5 tablets (12.5 mg total) by mouth daily.  Dispense: 45 tablet; Refill: 0  6. GERD  without esophagitis  Continue medication and refer to Dr. Fleet Contras  7. Anginal equivalent ([place])  - Ambulatory referral to Cardiology  8. CAD, multiple vessel  - Lipid panel - CRP High sensitivity - Ambulatory referral to Cardiology  9. Gastro-esophageal reflux disease without esophagitis  - Ambulatory referral to General Surgery  10. Other chest pain  - Ambulatory referral to General Surgery  11. History of gastric ulcer  - Ambulatory referral to General Surgery  12. Bradycardia, drug induced  Decrease dose of Metoprolol to half pill daily, and return in a few days for heart rate and bp check, offered EKG but he would like to hold off until seen by Dr. Fletcher Anon

## 2017-09-21 ENCOUNTER — Telehealth: Payer: Self-pay

## 2017-09-21 LAB — VITAMIN B12: VITAMIN B 12: 1106 pg/mL — AB (ref 200–1100)

## 2017-09-21 LAB — COMPLETE METABOLIC PANEL WITH GFR
AG Ratio: 1.8 (calc) (ref 1.0–2.5)
ALT: 16 U/L (ref 9–46)
AST: 23 U/L (ref 10–35)
Albumin: 4.3 g/dL (ref 3.6–5.1)
Alkaline phosphatase (APISO): 56 U/L (ref 40–115)
BUN/Creatinine Ratio: 5 (calc) — ABNORMAL LOW (ref 6–22)
BUN: 8 mg/dL (ref 7–25)
CALCIUM: 9.8 mg/dL (ref 8.6–10.3)
CO2: 30 mmol/L (ref 20–32)
CREATININE: 1.48 mg/dL — AB (ref 0.70–1.18)
Chloride: 106 mmol/L (ref 98–110)
GFR, EST NON AFRICAN AMERICAN: 47 mL/min/{1.73_m2} — AB (ref >=60)
GFR, Est African American: 55 mL/min/{1.73_m2} — ABNORMAL LOW (ref >=60)
GLOBULIN: 2.4 g/dL (ref 1.9–3.7)
Glucose, Bld: 96 mg/dL (ref 65–139)
Potassium: 4.4 mmol/L (ref 3.5–5.3)
SODIUM: 141 mmol/L (ref 135–146)
TOTAL PROTEIN: 6.7 g/dL (ref 6.1–8.1)
Total Bilirubin: 1.1 mg/dL (ref 0.2–1.2)

## 2017-09-21 LAB — LIPID PANEL
CHOL/HDL RATIO: 1.9 (calc) (ref <5.0)
Cholesterol: 89 mg/dL (ref <200)
HDL: 46 mg/dL (ref >40)
LDL CHOLESTEROL (CALC): 28 mg/dL
Non-HDL Cholesterol (Calc): 43 mg/dL (calc) (ref <130)
Triglycerides: 73 mg/dL (ref <150)

## 2017-09-21 LAB — CBC WITH DIFFERENTIAL/PLATELET
BASOS PCT: 0.4 %
Basophils Absolute: 30 cells/uL (ref 0–200)
EOS ABS: 60 {cells}/uL (ref 15–500)
Eosinophils Relative: 0.8 %
HCT: 43.3 % (ref 38.5–50.0)
Hemoglobin: 14.9 g/dL (ref 13.2–17.1)
Lymphs Abs: 1418 cells/uL (ref 850–3900)
MCH: 30.9 pg (ref 27.0–33.0)
MCHC: 34.4 g/dL (ref 32.0–36.0)
MCV: 89.8 fL (ref 80.0–100.0)
MPV: 12.5 fL (ref 7.5–12.5)
Monocytes Relative: 7 %
NEUTROS ABS: 5468 {cells}/uL (ref 1500–7800)
Neutrophils Relative %: 72.9 %
Platelets: 170 10*3/uL (ref 140–400)
RBC: 4.82 10*6/uL (ref 4.20–5.80)
RDW: 12 % (ref 11.0–15.0)
Total Lymphocyte: 18.9 %
WBC: 7.5 10*3/uL (ref 3.8–10.8)
WBCMIX: 525 {cells}/uL (ref 200–950)

## 2017-09-21 LAB — HIGH SENSITIVITY CRP: HS-CRP: 0.4 mg/L

## 2017-09-21 NOTE — Telephone Encounter (Signed)
Pt scheduled for AWV w/ [facility] 09/25/17

## 2017-10-04 ENCOUNTER — Ambulatory Visit (INDEPENDENT_AMBULATORY_CARE_PROVIDER_SITE_OTHER): Payer: Medicare Other

## 2017-10-04 VITALS — BP 118/60 | Temp 97.9°F | Resp 12 | Ht 71.0 in | Wt 158.6 lb

## 2017-10-04 DIAGNOSIS — Z9181 History of falling: Secondary | ICD-10-CM | POA: Diagnosis not present

## 2017-10-04 DIAGNOSIS — Z0000 Encounter for general adult medical examination without abnormal findings: Secondary | ICD-10-CM

## 2017-10-04 DIAGNOSIS — H539 Unspecified visual disturbance: Secondary | ICD-10-CM

## 2017-10-04 NOTE — Patient Instructions (Signed)
Kevin Roberts , Thank you for taking time to come for your Medicare Wellness Visit. I appreciate your ongoing commitment to your health goals. Please review the following plan we discussed and let me know if I can assist you in the future.   Screening recommendations/referrals: Colorectal Screening: Up to date Lung Cancer Screening: You will receive a call from Beverly Sessions, RN to facilitate scheduling your CT annually Hepatitis C Screening: Up to date  Vision and Dental Exams: Recommended annual ophthalmology exams for early detection of glaucoma and other disorders of the eye Recommended annual dental exams for proper oral hygiene  Vaccinations: Influenza vaccine: Up to date Pneumococcal vaccine: Up to date Tdap vaccine: Up to date Shingles vaccine: Please call your insurance company to determine your out of pocket expense for the Shingrix vaccine. You may also receive this vaccine at your local pharmacy or Health Dept.    Advanced directives: Advance directive discussed with you today. I have provided a copy for you to complete at home and have notarized. Once this is complete please bring a copy in to our office so we can scan it into your chart.  Goals: Recommend to drink at least 6-8 8oz glasses of water per day.  Next appointment: Please schedule your Annual Wellness Visit with your Nurse Health Advisor in one year.  Preventive Care 40 Years and Older, Male Preventive care refers to lifestyle choices and visits with your health care provider that can promote health and wellness. What does preventive care include?  A yearly physical exam. This is also called an annual well check.  Dental exams once or twice a year.  Routine eye exams. Ask your health care provider how often you should have your eyes checked.  Personal lifestyle choices, including:  Daily care of your teeth and gums.  Regular physical activity.  Eating a healthy diet.  Avoiding tobacco and drug  use.  Limiting alcohol use.  Practicing safe sex.  Taking low doses of aspirin every day.  Taking vitamin and mineral supplements as recommended by your health care provider. What happens during an annual well check? The services and screenings done by your health care provider during your annual well check will depend on your age, overall health, lifestyle risk factors, and family history of disease. Counseling  Your health care provider may ask you questions about your:  Alcohol use.  Tobacco use.  Drug use.  Emotional well-being.  Home and relationship well-being.  Sexual activity.  Eating habits.  History of falls.  Memory and ability to understand (cognition).  Work and work Statistician. Screening  You may have the following tests or measurements:  Height, weight, and BMI.  Blood pressure.  Lipid and cholesterol levels. These may be checked every 5 years, or more frequently if you are over 4 years old.  Skin check.  Lung cancer screening. You may have this screening every year starting at age 51 if you have a 30-pack-year history of smoking and currently smoke or have quit within the past 15 years.  Fecal occult blood test (FOBT) of the stool. You may have this test every year starting at age 71.  Flexible sigmoidoscopy or colonoscopy. You may have a sigmoidoscopy every 5 years or a colonoscopy every 10 years starting at age 54.  Prostate cancer screening. Recommendations will vary depending on your family history and other risks.  Hepatitis C blood test.  Hepatitis B blood test.  Sexually transmitted disease (STD) testing.  Diabetes screening. This is  done by checking your blood sugar (glucose) after you have not eaten for a while (fasting). You may have this done every 1-3 years.  Abdominal aortic aneurysm (AAA) screening. You may need this if you are a current or former smoker.  Osteoporosis. You may be screened starting at age 48 if you are at  high risk. Talk with your health care provider about your test results, treatment options, and if necessary, the need for more tests. Vaccines  Your health care provider may recommend certain vaccines, such as:  Influenza vaccine. This is recommended every year.  Tetanus, diphtheria, and acellular pertussis (Tdap, Td) vaccine. You may need a Td booster every 10 years.  Zoster vaccine. You may need this after age 70.  Pneumococcal 13-valent conjugate (PCV13) vaccine. One dose is recommended after age 56.  Pneumococcal polysaccharide (PPSV23) vaccine. One dose is recommended after age 54. Talk to your health care provider about which screenings and vaccines you need and how often you need them. This information is not intended to replace advice given to you by your health care provider. Make sure you discuss any questions you have with your health care provider. Document Released: 04/23/2015 Document Revised: 12/15/2015 Document Reviewed: 01/26/2015 Elsevier Interactive Patient Education  2017 [place] Prevention in the Home Falls can cause injuries. They can happen to people of all ages. There are many things you can do to make your home safe and to help prevent falls. What can I do on the outside of my home?  Regularly fix the edges of walkways and driveways and fix any cracks.  Remove anything that might make you trip as you walk through a door, such as a raised step or threshold.  Trim any bushes or trees on the path to your home.  Use bright outdoor lighting.  Clear any walking paths of anything that might make someone trip, such as rocks or tools.  Regularly check to see if handrails are loose or broken. Make sure that both sides of any steps have handrails.  Any raised decks and porches should have guardrails on the edges.  Have any leaves, snow, or ice cleared regularly.  Use sand or salt on walking paths during winter.  Clean up any spills in your garage  right away. This includes oil or grease spills. What can I do in the bathroom?  Use night lights.  Install grab bars by the toilet and in the tub and shower. Do not use towel bars as grab bars.  Use non-skid mats or decals in the tub or shower.  If you need to sit down in the shower, use a plastic, non-slip stool.  Keep the floor dry. Clean up any water that spills on the floor as soon as it happens.  Remove soap buildup in the tub or shower regularly.  Attach bath mats securely with double-sided non-slip rug tape.  Do not have throw rugs and other things on the floor that can make you trip. What can I do in the bedroom?  Use night lights.  Make sure that you have a light by your bed that is easy to reach.  Do not use any sheets or blankets that are too big for your bed. They should not hang down onto the floor.  Have a firm chair that has side arms. You can use this for support while you get dressed.  Do not have throw rugs and other things on the floor that can make you trip. What  can I do in the kitchen?  Clean up any spills right away.  Avoid walking on wet floors.  Keep items that you use a lot in easy-to-reach places.  If you need to reach something above you, use a strong step stool that has a grab bar.  Keep electrical cords out of the way.  Do not use floor polish or wax that makes floors slippery. If you must use wax, use non-skid floor wax.  Do not have throw rugs and other things on the floor that can make you trip. What can I do with my stairs?  Do not leave any items on the stairs.  Make sure that there are handrails on both sides of the stairs and use them. Fix handrails that are broken or loose. Make sure that handrails are as long as the stairways.  Check any carpeting to make sure that it is firmly attached to the stairs. Fix any carpet that is loose or worn.  Avoid having throw rugs at the top or bottom of the stairs. If you do have throw rugs,  attach them to the floor with carpet tape.  Make sure that you have a light switch at the top of the stairs and the bottom of the stairs. If you do not have them, ask someone to add them for you. What else can I do to help prevent falls?  Wear shoes that:  Do not have high heels.  Have rubber bottoms.  Are comfortable and fit you well.  Are closed at the toe. Do not wear sandals.  If you use a stepladder:  Make sure that it is fully opened. Do not climb a closed stepladder.  Make sure that both sides of the stepladder are locked into place.  Ask someone to hold it for you, if possible.  Clearly mark and make sure that you can see:  Any grab bars or handrails.  First and last steps.  Where the edge of each step is.  Use tools that help you move around (mobility aids) if they are needed. These include:  Canes.  Walkers.  Scooters.  Crutches.  Turn on the lights when you go into a dark area. Replace any light bulbs as soon as they burn out.  Set up your furniture so you have a clear path. Avoid moving your furniture around.  If any of your floors are uneven, fix them.  If there are any pets around you, be aware of where they are.  Review your medicines with your doctor. Some medicines can make you feel dizzy. This can increase your chance of falling. Ask your doctor what other things that you can do to help prevent falls. This information is not intended to replace advice given to you by your health care provider. Make sure you discuss any questions you have with your health care provider. Document Released: 01/21/2009 Document Revised: 09/02/2015 Document Reviewed: 05/01/2014 Elsevier Interactive Patient Education  2017 [employer]   Smoking Tobacco Information Smoking tobacco will very likely harm your health. Tobacco contains a poisonous (toxic), colorless chemical called nicotine. Nicotine affects the brain and makes tobacco addictive. This change in your  brain can make it hard to stop smoking. Tobacco also has other toxic chemicals that can hurt your body and raise your risk of many cancers. How can smoking tobacco affect me? Smoking tobacco can increase your chances of having serious health conditions, such as:  Cancer. Smoking is most commonly associated with lung cancer, but can  lead to cancer in other parts of the body.  Chronic obstructive pulmonary disease (COPD). This is a long-term lung condition that makes it hard to breathe. It also gets worse over time.  High blood pressure (hypertension), heart disease, stroke, or heart attack.  Lung infections, such as pneumonia.  Cataracts. This is when the lenses in the eyes become clouded.  Digestive problems. This may include peptic ulcers, heartburn, and gastroesophageal reflux disease (GERD).  Oral health problems, such as gum disease and tooth loss.  Loss of taste and smell.  Smoking can affect your appearance by causing:  Wrinkles.  Yellow or stained teeth, fingers, and fingernails.  Smoking tobacco can also affect your social life.  Many workplaces, [employer], hotels, and public places are tobacco-free. This means that you may experience challenges in finding places to smoke when away from home.  The cost of a smoking habit can be expensive. Expenses for someone who smokes come in two ways: ? You spend money on a regular basis to buy tobacco. ? Your health care costs in the long-term are higher if you smoke.  Tobacco smoke can also affect the health of those around you. Children of smokers have greater chances of: ? Sudden infant death syndrome (SIDS). ? Ear infections. ? Lung infections.  What lifestyle changes can be made?  Do not start smoking. Quit if you already do.  To quit smoking: ? Make a plan to quit smoking and commit yourself to it. Look for programs to help you and ask your health care provider for recommendations and ideas. ? Talk with your health  care provider about using nicotine replacement medicines to help you quit. Medicine replacement medicines include gum, lozenges, patches, sprays, or pills. ? Do not replace cigarette smoking with electronic cigarettes, which are commonly called e-cigarettes. The safety of e-cigarettes is not known, and some may contain harmful chemicals. ? Avoid places, people, or situations that tempt you to smoke. ? If you try to quit but return to smoking, don't give up hope. It is very common for people to try a number of times before they fully succeed. When you feel ready again, give it another try.  Quitting smoking might affect the way you eat as well as your weight. Be prepared to monitor your eating habits. Get support in planning and following a healthy diet.  Ask your health care provider about having regular tests (screenings) to check for cancer. This may include blood tests, imaging tests, and other tests.  Exercise regularly. Consider taking walks, joining a gym, or doing yoga or exercise classes.  Develop skills to manage your stress. These skills include meditation. What are the benefits of quitting smoking? By quitting smoking, you may:  Lower your risk of getting cancer and other diseases caused by smoking.  Live longer.  Breathe better.  Lower your blood pressure and heart rate.  Stop your addiction to tobacco.  Stop creating secondhand smoke that hurts other people.  Improve your sense of taste and smell.  Look better over time, due to having fewer wrinkles and less staining.  What can happen if changes are not made? If you do not stop smoking, you may:  Get cancer and other diseases.  Develop COPD or other long-term (chronic) lung conditions.  Develop serious problems with your heart and blood vessels (cardiovascular system).  Need more tests to screen for problems caused by smoking.  Have higher, long-term healthcare costs from medicines or treatments related to  smoking.  Continue  to have worsening changes in your lungs, mouth, and nose.  Where to find support: To get support to quit smoking, consider:  Asking your health care provider for more information and resources.  Taking classes to learn more about quitting smoking.  Looking for local organizations that offer resources about quitting smoking.  Joining a support group for people who want to quit smoking in your local community.  Where to find more information: You may find more information about quitting smoking from:  HelpGuide.org: www.helpguide.org/articles/addictions/how-to-quit-smoking.htm  https://hall.com/: smokefree.gov  American Lung Association: www.lung.org  Contact a health care provider if:  You have problems breathing.  Your lips, nose, or fingers turn blue.  You have chest pain.  You are coughing up blood.  You feel faint or you pass out.  You have other noticeable changes that cause you to worry. Summary  Smoking tobacco can negatively affect your health, the health of those around you, your finances, and your social life.  Do not start smoking. Quit if you already do. If you need help quitting, ask your health care provider.  Think about joining a support group for people who want to quit smoking in your local community. There are many effective programs that will help you to quit this behavior. This information is not intended to replace advice given to you by your health care provider. Make sure you discuss any questions you have with your health care provider. Document Released: 04/11/2016 Document Revised: 04/11/2016 Document Reviewed: 04/11/2016 Elsevier Interactive Patient Education  [employer].

## 2017-10-04 NOTE — Progress Notes (Addendum)
Subjective:   Kevin Roberts is a 72 y.o. male who presents for Medicare Annual/Subsequent preventive examination.  Review of Systems:  N/A Cardiac Risk Factors include: sedentary lifestyle;smoking/ tobacco exposure;advanced age (>34men, >22 women);male gender;dyslipidemia;hypertension     Objective:    Vitals: BP 118/60 (BP Location: Left Arm, Patient Position: Sitting, Cuff Size: Normal)   Temp 97.9 F (36.6 C) (Oral)   Resp 12   Ht 5\' 11"  (1.803 m)   Wt 158 lb 9.6 oz (71.9 kg)   BMI 22.12 kg/m   Body mass index is 22.12 kg/m.  Advanced Directives 10/04/2017 09/20/2016 05/22/2016 01/18/2016 10/14/2015 07/02/2015 04/15/2015  Does Patient Have a Medical Advance Directive? No No No No No No No  Would patient like information on creating a medical advance directive? Yes (MAU/Ambulatory/Procedural Areas - Information given) - - No - patient declined information - No - patient declined information No - patient declined information    Tobacco Social History   Tobacco Use  Smoking Status Current Every Day Smoker  . Packs/day: 1.00  . Years: 41.00  . Pack years: 41.00  . Types: Cigarettes  . Start date: 10/04/1973  Smokeless Tobacco Never Used     Ready to quit: No Counseling given: Yes  Clinical Intake:  Pre-visit preparation completed: Yes  Pain : No/denies pain   BMI - recorded: 22.12 Nutritional Status: BMI of 19-24  Normal Nutritional Risks: None Diabetes: No  How often do you need to have someone help you when you read instructions, pamphlets, or other written materials from your doctor or pharmacy?: 1 - Never  Interpreter Needed?: No  Information entered by :: AEversole, LPN  Past Medical History:  Diagnosis Date  . Benign prostatic hypertrophy   . CAD (coronary atherosclerotic disease)   . Chronic kidney disease, stage III (moderate) (HCC)   . COPD (chronic obstructive pulmonary disease) ([place])   . Cramps, muscle, general   . Elevated ferritin   . GERD  (gastroesophageal reflux disease)   . History of hypercalcemia   . Hyperlipidemia   . Hypertension   . Incomplete bladder emptying   . Nocturia   . Peripheral polyneuropathy   . Rising PSA level   . Tobacco use   . Urgency of micturation   . Vitamin B 12 deficiency    Past Surgical History:  Procedure Laterality Date  . CARDIAC CATHETERIZATION N/A 07/02/2015   Procedure: Left Heart Cath and Coronary Angiography;  Surgeon: Wellington Hampshire, MD;  Location: [facility];  Service: Cardiovascular;  Laterality: N/A;  . throat biopsy     lump removed   Family History  Problem Relation Age of Onset  . Hypertension Mother   . CVA Mother   . Cancer Father        Lung  . Mental illness Brother        1-Schizophrenia  . GER disease Son   . Healthy Sister   . Heart Problems Brother   . Healthy Sister   . Hypertension Brother    Social History   Socioeconomic History  . Marital status: Married    Spouse name: Claudine  . Number of children: 3  . Years of education: some college  . Highest education level: 12th grade  Occupational History  . Occupation: Retired  Scientific laboratory technician  . Financial resource strain: Not hard at all  . Food insecurity:    Worry: Never true    Inability: Never true  . Transportation needs:  Medical: No    Non-medical: No  Tobacco Use  . Smoking status: Current Every Day Smoker    Packs/day: 1.00    Years: 41.00    Pack years: 41.00    Types: Cigarettes    Start date: 10/04/1973  . Smokeless tobacco: Never Used  Substance and Sexual Activity  . Alcohol use: No    Alcohol/week: 0.0 oz  . Drug use: No  . Sexual activity: Not Currently    Partners: Female  Lifestyle  . Physical activity:    Days per week: 0 days    Minutes per session: 0 min  . Stress: Not at all  Relationships  . Social connections:    Talks on phone: Patient refused    Gets together: Patient refused    Attends religious service: Patient refused    Active member of  club or organization: Patient refused    Attends meetings of clubs or organizations: Patient refused    Relationship status: Married  Other Topics Concern  . Not on file  Social History Narrative  . Not on file    Outpatient Encounter Medications as of 10/04/2017  Medication Sig  . aspirin 81 MG tablet Take 1 tablet by mouth daily.  Marland Kitchen atorvastatin (LIPITOR) 40 MG tablet Take 1 tablet (40 mg total) by mouth daily.  . Cyanocobalamin (B-12) 1000 MCG TBCR Take 1 tablet by mouth daily.  Marland Kitchen gabapentin (NEURONTIN) 300 MG capsule Take 1 capsule (300 mg total) by mouth 2 (two) times daily.  . metoprolol succinate (TOPROL-XL) 25 MG 24 hr tablet Take 0.5 tablets (12.5 mg total) by mouth daily.  . MULTIPLE VITAMIN PO Take 1 tablet by mouth daily.  . nitroGLYCERIN (NITROSTAT) 0.4 MG SL tablet Place 1 tablet (0.4 mg total) under the tongue every 5 (five) minutes as needed for chest pain.  . pantoprazole (PROTONIX) 40 MG tablet Take 1 tablet (40 mg total) by mouth daily.  . tamsulosin (FLOMAX) 0.4 MG CAPS capsule Take 1 capsule (0.4 mg total) by mouth daily.  Marland Kitchen umeclidinium-vilanterol (ANORO ELLIPTA) 62.5-25 MCG/INH AEPB Inhale 1 puff into the lungs daily.  . Vitamin D, Cholecalciferol, 1000 UNITS CAPS Take 1 tablet by mouth daily.   No facility-administered encounter medications on file as of 10/04/2017.     Activities of Daily Living In your present state of health, do you have any difficulty performing the following activities: 10/04/2017 09/20/2017  Hearing? N N  Comment denies hearing aids -  Vision? N Y  Comment wears eyeglasses glasses  Difficulty concentrating or making decisions? Y N  Comment short term memory loss -  Walking or climbing stairs? N N  Dressing or bathing? N N  Doing errands, shopping? N N  Preparing Food and eating ? N -  Comment partial lower dentures and full upper dentures -  Using the Toilet? N -  In the past six months, have you accidently leaked urine? N -  Do you  have problems with loss of bowel control? N -  Managing your Medications? N -  Managing your Finances? N -  Housekeeping or managing your Housekeeping? N -  Some recent data might be hidden    Patient Care Team: Steele Sizer, MD as PCP - General (Family Medicine) Wellington Hampshire, MD as Consulting Physician (Cardiology) Bary Castilla, Forest Gleason, MD as Consulting Physician (General Surgery) Laneta Simmers as Physician Assistant (Urology)   Assessment:   This is a routine wellness examination for Shan.  Exercise Activities and  Dietary recommendations Current Exercise Habits: The patient does not participate in regular exercise at present, Exercise limited by: None identified  Goals    . DIET - INCREASE WATER INTAKE     Recommend to drink at least 6-8 8oz glasses of water per day.       Fall Risk Fall Risk  10/04/2017 09/20/2017 05/25/2017 01/22/2017 09/20/2016  Falls in the past year? No No No No No  Risk for fall due to : Impaired vision - - - -  Risk for fall due to: Comment wears eyeglasses - - - -   FALL RISK PREVENTION PERTAINING TO HOME: Is your home free of loose throw rugs in walkways, pet beds, electrical cords, etc? Yes Is there adequate lighting in your home to reduce risk of falls?  Yes Are there stairs in or around your home WITH handrails? No stairs  ASSISTIVE DEVICES UTILIZED TO PREVENT FALLS: Use of a cane, walker or w/c? No Grab bars in the bathroom? No  Shower chair or a place to sit while bathing? No An elevated toilet seat or a handicapped toilet? No  Timed Get Up and Go Performed: Yes. Pt ambulated 10 feet within 12 sec. Gait slow, steady and without the use of an assistive device. No intervention required at this time. Fall risk prevention has been discussed.  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 Pt declined my offer to send referral for an  elevated toilet seat.  Depression Screen PHQ 2/9 Scores 10/04/2017 09/20/2017 05/25/2017 09/20/2016  PHQ - 2 Score 0 0 0 0  PHQ- 9 Score 0 - - -    Cognitive Function     6CIT Screen 10/04/2017  What Year? 0 points  What month? 0 points  What time? 0 points  Count back from 20 0 points  Months in reverse 0 points  Repeat phrase 2 points  Total Score 2    Immunization History  Administered Date(s) Administered  . Influenza Split 01/13/2009  . Influenza, High Dose Seasonal PF 01/18/2016, 01/22/2017  . Influenza, Seasonal, Injecte, Preservative Fre 01/31/2011  . Influenza,inj,Quad PF,6+ Mos 02/21/2013, 03/02/2014, 11/25/2014  . Influenza-Unspecified 03/02/2014  . Pneumococcal Conjugate-13 06/30/2014  . Pneumococcal Polysaccharide-23 06/14/2009, 10/14/2015  . Pneumococcal-Unspecified 06/14/2009  . Td 11/10/2008  . Tdap 11/10/2008  . Zoster 10/03/2011    Qualifies for Shingles Vaccine? Yes. Zostavax completed 10/03/11. Due for Shingrix. Education has been provided regarding the importance of this vaccine. Pt has been advised to call his insurance company to determine his out of pocket expense. Advised he may also receive this vaccine at his local pharmacy or Health Dept. Verbalized acceptance and understanding.  Screening Tests Health Maintenance  Topic Date Due  . INFLUENZA VACCINE  11/08/2017  . TETANUS/TDAP  12/08/2018  . COLONOSCOPY  04/24/2019  . Hepatitis C Screening  Completed  . PNA vac Low Risk Adult  Completed   Cancer Screenings: Lung: Low Dose CT Chest recommended if Age 75-80 years, 30 pack-year currently smoking OR have quit w/in 15years. Patient does qualify. Followed by [facility] annually for Lung Cancer screenings. Completed recent CT 06/14/17 Colorectal: Completed 04/23/09. Repeat every 10 years  Additional Screenings: Hepatitis C Screening: Completed 02/06/12     Plan:  I have personally reviewed and addressed the Medicare Annual Wellness questionnaire  and have noted the following in the patient's chart:  A. Medical and social history B. Use  of alcohol, tobacco or illicit drugs  C. Current medications and supplements D. Functional ability and status E.  Nutritional status F.  Physical activity G. Advance directives H. List of other physicians I.  Hospitalizations, surgeries, and ER visits in previous 12 months J.  [place] such as hearing and vision if needed, cognitive and depression L. Referrals and appointments  In addition, I have reviewed and discussed with patient certain preventive protocols, quality metrics, and best practice recommendations. A written personalized care plan for preventive services as well as general preventive health recommendations were provided to patient.  See attached scanned questionnaire for additional information.   Signed,  Aleatha Borer, LPN Nurse Health Advisor  I have reviewed this encounter including the documentation in this note and/or discussed this patient with the provider, Aleatha Borer, LPN. I am certifying that I agree with the content of this note as supervising physician.  Steele Sizer, MD [facility] 10/04/2017, 1:26 PM

## 2017-10-25 ENCOUNTER — Encounter: Payer: Self-pay | Admitting: General Surgery

## 2017-10-25 ENCOUNTER — Ambulatory Visit (INDEPENDENT_AMBULATORY_CARE_PROVIDER_SITE_OTHER): Payer: Medicare Other | Admitting: General Surgery

## 2017-10-25 VITALS — BP 110/60 | HR 56 | Resp 14 | Ht 71.0 in | Wt 158.0 lb

## 2017-10-25 DIAGNOSIS — G8929 Other chronic pain: Secondary | ICD-10-CM | POA: Diagnosis not present

## 2017-10-25 DIAGNOSIS — I2510 Atherosclerotic heart disease of native coronary artery without angina pectoris: Secondary | ICD-10-CM | POA: Diagnosis not present

## 2017-10-25 DIAGNOSIS — R1031 Right lower quadrant pain: Secondary | ICD-10-CM

## 2017-10-25 DIAGNOSIS — Q396 Congenital diverticulum of esophagus: Secondary | ICD-10-CM | POA: Diagnosis not present

## 2017-10-25 NOTE — Patient Instructions (Addendum)
Patient to have a abdominal ultrasound and upper endoscopy.  The patient is aware to call back for any questions or concerns.  The patient is scheduled for an ultrasound at [facility] on 10/31/17 at 9:30 am. He is to arrive there by 9:15 am and have nothing to eat or drink for 6 hours prior. The patient is scheduled for an Upper Endoscopy at [facility] on 11/14/17. They are aware to call the day before to get their arrival time. He will only take his Metoprolol at 6 am with a sip of water the morning of. Miralax prescription has been sent into the patient's pharmacy. The patient is aware of date and instructions.

## 2017-10-25 NOTE — Progress Notes (Signed)
Patient ID: Kevin Roberts, male   DOB: 21-Apr-1945, 72 y.o.   MRN: [id number]  Chief Complaint  Patient presents with  . Other    HPI Kevin Roberts is a 72 y.o. male here today for a evaluation of right side chest pain. He states this started about five month ago. In the last month the pain has got worst. The pain last for about a hour like a dull ache. The pain come and goes.Wakes him up at night. No nausea or vomiting. Patient states he feels like something is in his epigrastic area.  Moves his bowels every three days. HPI  Past Medical History:  Diagnosis Date  . Benign prostatic hypertrophy   . CAD (coronary atherosclerotic disease)   . Chronic kidney disease, stage III (moderate) (HCC)   . COPD (chronic obstructive pulmonary disease) ([place])   . Cramps, muscle, general   . Elevated ferritin   . GERD (gastroesophageal reflux disease)   . History of hypercalcemia   . Hyperlipidemia   . Hypertension   . Incomplete bladder emptying   . Nocturia   . Peripheral polyneuropathy   . Rising PSA level   . Tobacco use   . Urgency of micturation   . Vitamin B 12 deficiency     Past Surgical History:  Procedure Laterality Date  . CARDIAC CATHETERIZATION N/A 07/02/2015   Procedure: Left Heart Cath and Coronary Angiography;  Surgeon: Wellington Hampshire, MD;  Location: [facility];  Service: Cardiovascular;  Laterality: N/A;  . COLONOSCOPY  2011   [facility]  . throat biopsy     lump removed    Family History  Problem Relation Age of Onset  . Hypertension Mother   . CVA Mother   . Cancer Father        Lung  . Mental illness Brother        1-Schizophrenia  . GER disease Son   . Healthy Sister   . Heart Problems Brother   . Healthy Sister   . Hypertension Brother     Social History Social History   Tobacco Use  . Smoking status: Current Every Day Smoker    Packs/day: 1.00    Years: 41.00    Pack years: 41.00    Types: Cigarettes    Start date: 10/04/1973  . Smokeless  tobacco: Never Used  Substance Use Topics  . Alcohol use: No    Alcohol/week: 0.0 oz  . Drug use: No    No Known Allergies  Current Outpatient Medications  Medication Sig Dispense Refill  . aspirin 81 MG tablet Take 1 tablet by mouth daily.    Marland Kitchen atorvastatin (LIPITOR) 40 MG tablet Take 1 tablet (40 mg total) by mouth daily. 90 tablet 1  . Cyanocobalamin (B-12) 1000 MCG TBCR Take 1 tablet by mouth daily.    Marland Kitchen gabapentin (NEURONTIN) 300 MG capsule Take 1 capsule (300 mg total) by mouth 2 (two) times daily. 180 capsule 1  . metoprolol succinate (TOPROL-XL) 25 MG 24 hr tablet Take 0.5 tablets (12.5 mg total) by mouth daily. 45 tablet 0  . MULTIPLE VITAMIN PO Take 1 tablet by mouth daily.    . nitroGLYCERIN (NITROSTAT) 0.4 MG SL tablet Place 1 tablet (0.4 mg total) under the tongue every 5 (five) minutes as needed for chest pain. 30 tablet 0  . pantoprazole (PROTONIX) 40 MG tablet Take 1 tablet (40 mg total) by mouth daily. 90 tablet 1  . tamsulosin (FLOMAX) 0.4 MG CAPS  capsule Take 1 capsule (0.4 mg total) by mouth daily. 90 capsule 2  . umeclidinium-vilanterol (ANORO ELLIPTA) 62.5-25 MCG/INH AEPB Inhale 1 puff into the lungs daily. 60 each 3  . Vitamin D, Cholecalciferol, 1000 UNITS CAPS Take 1 tablet by mouth daily.     No current facility-administered medications for this visit.     Review of Systems Review of Systems  Constitutional: Negative.   Respiratory: Negative.   Cardiovascular: Negative.     Blood pressure 110/60, pulse (!) 56, resp. rate 14, height 5\' 11"  (1.803 m), weight 158 lb (71.7 kg).  Physical Exam Physical Exam  Constitutional: He is oriented to person, place, and time. He appears well-developed and well-nourished.  Cardiovascular: Normal rate, regular rhythm and normal heart sounds.  Pulmonary/Chest: Effort normal and breath sounds normal.  Neurological: He is alert and oriented to person, place, and time.  Skin: Skin is warm.    Data Reviewed EGD dated  April 27, 2009 showed a large diverticulum of the distal esophagus without evidence of bleeding.  October 06, 2008 upper endoscopy had showed a cratered esophageal ulcer without bleeding 37 cm from the incisors.  Moderate esophagitis was noted.  Assessment    Atypical abdominal pain.    Plan  Patient to have a abdominal  ultrasound.  Upper GI endoscopy if that study is normal. The patient is aware to call back for any questions or concerns.  HPI, Physical Exam, Assessment and Plan have been scribed under the direction and in the presence of Hervey Ard, MD. Gaspar Cola, CMA  I have completed the exam and reviewed the above documentation for accuracy and completeness.  I agree with the above.  Haematologist has been used and any errors in dictation or transcription are unintentional.  Hervey Ard, M.D., F.A.C.S.  The patient is scheduled for an ultrasound at [facility] on 10/31/17 at 9:30 am. He is to arrive there by 9:15 am and have nothing to eat or drink for 6 hours prior. The patient is scheduled for an Upper Endoscopy at [facility] on 11/14/17. They are aware to call the day before to get their arrival time. He will only take his Metoprolol at 6 am with a sip of water the morning of. Miralax prescription has been sent into the patient's pharmacy. The patient is aware of date and instructions.  Documented by Caryl-Lyn Otis Brace LPN  [name] 10/26/2017, 5:48 PM

## 2017-10-26 DIAGNOSIS — G8929 Other chronic pain: Secondary | ICD-10-CM | POA: Insufficient documentation

## 2017-10-26 DIAGNOSIS — R1031 Right lower quadrant pain: Principal | ICD-10-CM

## 2017-10-26 DIAGNOSIS — Q396 Congenital diverticulum of esophagus: Secondary | ICD-10-CM | POA: Insufficient documentation

## 2017-10-31 ENCOUNTER — Ambulatory Visit
Admission: RE | Admit: 2017-10-31 | Discharge: 2017-10-31 | Disposition: A | Discharge status: Home / Self Care | Payer: Medicare Other | Source: Ambulatory Visit | Attending: General Surgery | Admitting: General Surgery

## 2017-10-31 DIAGNOSIS — R1031 Right lower quadrant pain: Secondary | ICD-10-CM | POA: Diagnosis not present

## 2017-10-31 DIAGNOSIS — K7689 Other specified diseases of liver: Secondary | ICD-10-CM | POA: Diagnosis not present

## 2017-10-31 DIAGNOSIS — G8929 Other chronic pain: Secondary | ICD-10-CM | POA: Insufficient documentation

## 2017-11-05 ENCOUNTER — Telehealth: Payer: Self-pay

## 2017-11-05 NOTE — Telephone Encounter (Signed)
Notified patient as instructed, patient pleased. Discussed follow-up appointments, patient agrees  

## 2017-11-05 NOTE — Telephone Encounter (Signed)
-----   Message from Robert Bellow, MD sent at 11/03/2017  7:49 AM EDT ----- Please noitfy the patient that the ultrasound of the gallbladder was OK, just some small fluid pockets in the liver (old). No gallstones. Would suggest we proceed with upper endoscopy as discussed at [facility]. Thanks.  ----- Message ----- From: Interface, Rad Results In Sent: 10/31/2017  10:42 AM To: Robert Bellow, MD

## 2017-11-14 ENCOUNTER — Ambulatory Visit: Payer: Medicare Other | Admitting: Certified Registered"

## 2017-11-14 ENCOUNTER — Ambulatory Visit
Admission: RE | Admit: 2017-11-14 | Discharge: 2017-11-14 | Disposition: A | Discharge status: Home / Self Care | Payer: Medicare Other | Source: Ambulatory Visit | Attending: General Surgery | Admitting: General Surgery

## 2017-11-14 ENCOUNTER — Encounter
Admission: RE | Disposition: A | Discharge status: Home / Self Care | Payer: Self-pay | Source: Ambulatory Visit | Attending: General Surgery

## 2017-11-14 DIAGNOSIS — R351 Nocturia: Secondary | ICD-10-CM | POA: Diagnosis not present

## 2017-11-14 DIAGNOSIS — K225 Diverticulum of esophagus, acquired: Secondary | ICD-10-CM | POA: Diagnosis not present

## 2017-11-14 DIAGNOSIS — Z818 Family history of other mental and behavioral disorders: Secondary | ICD-10-CM | POA: Insufficient documentation

## 2017-11-14 DIAGNOSIS — E785 Hyperlipidemia, unspecified: Secondary | ICD-10-CM | POA: Diagnosis not present

## 2017-11-14 DIAGNOSIS — K449 Diaphragmatic hernia without obstruction or gangrene: Secondary | ICD-10-CM | POA: Insufficient documentation

## 2017-11-14 DIAGNOSIS — Z823 Family history of stroke: Secondary | ICD-10-CM | POA: Insufficient documentation

## 2017-11-14 DIAGNOSIS — R1013 Epigastric pain: Secondary | ICD-10-CM | POA: Insufficient documentation

## 2017-11-14 DIAGNOSIS — G629 Polyneuropathy, unspecified: Secondary | ICD-10-CM | POA: Insufficient documentation

## 2017-11-14 DIAGNOSIS — N183 Chronic kidney disease, stage 3 (moderate): Secondary | ICD-10-CM | POA: Diagnosis not present

## 2017-11-14 DIAGNOSIS — Z8489 Family history of other specified conditions: Secondary | ICD-10-CM | POA: Insufficient documentation

## 2017-11-14 DIAGNOSIS — K219 Gastro-esophageal reflux disease without esophagitis: Secondary | ICD-10-CM | POA: Insufficient documentation

## 2017-11-14 DIAGNOSIS — N400 Enlarged prostate without lower urinary tract symptoms: Secondary | ICD-10-CM | POA: Insufficient documentation

## 2017-11-14 DIAGNOSIS — G8929 Other chronic pain: Secondary | ICD-10-CM

## 2017-11-14 DIAGNOSIS — R3915 Urgency of urination: Secondary | ICD-10-CM | POA: Diagnosis not present

## 2017-11-14 DIAGNOSIS — Z7982 Long term (current) use of aspirin: Secondary | ICD-10-CM | POA: Diagnosis not present

## 2017-11-14 DIAGNOSIS — K314 Gastric diverticulum: Secondary | ICD-10-CM | POA: Insufficient documentation

## 2017-11-14 DIAGNOSIS — I129 Hypertensive chronic kidney disease with stage 1 through stage 4 chronic kidney disease, or unspecified chronic kidney disease: Secondary | ICD-10-CM | POA: Diagnosis not present

## 2017-11-14 DIAGNOSIS — I1310 Hypertensive heart and chronic kidney disease without heart failure, with stage 1 through stage 4 chronic kidney disease, or unspecified chronic kidney disease: Secondary | ICD-10-CM | POA: Insufficient documentation

## 2017-11-14 DIAGNOSIS — R339 Retention of urine, unspecified: Secondary | ICD-10-CM | POA: Diagnosis not present

## 2017-11-14 DIAGNOSIS — Z79899 Other long term (current) drug therapy: Secondary | ICD-10-CM | POA: Insufficient documentation

## 2017-11-14 DIAGNOSIS — Z8249 Family history of ischemic heart disease and other diseases of the circulatory system: Secondary | ICD-10-CM | POA: Insufficient documentation

## 2017-11-14 DIAGNOSIS — Q396 Congenital diverticulum of esophagus: Secondary | ICD-10-CM | POA: Diagnosis not present

## 2017-11-14 DIAGNOSIS — E538 Deficiency of other specified B group vitamins: Secondary | ICD-10-CM | POA: Diagnosis not present

## 2017-11-14 DIAGNOSIS — J449 Chronic obstructive pulmonary disease, unspecified: Secondary | ICD-10-CM | POA: Diagnosis not present

## 2017-11-14 DIAGNOSIS — F17210 Nicotine dependence, cigarettes, uncomplicated: Secondary | ICD-10-CM | POA: Insufficient documentation

## 2017-11-14 DIAGNOSIS — I2510 Atherosclerotic heart disease of native coronary artery without angina pectoris: Secondary | ICD-10-CM | POA: Diagnosis not present

## 2017-11-14 DIAGNOSIS — Z8379 Family history of other diseases of the digestive system: Secondary | ICD-10-CM | POA: Insufficient documentation

## 2017-11-14 DIAGNOSIS — R1031 Right lower quadrant pain: Secondary | ICD-10-CM

## 2017-11-14 DIAGNOSIS — Z801 Family history of malignant neoplasm of trachea, bronchus and lung: Secondary | ICD-10-CM | POA: Insufficient documentation

## 2017-11-14 DIAGNOSIS — R1011 Right upper quadrant pain: Secondary | ICD-10-CM | POA: Diagnosis not present

## 2017-11-14 HISTORY — PX: ESOPHAGOGASTRODUODENOSCOPY (EGD) WITH PROPOFOL: SHX5813

## 2017-11-14 SURGERY — ESOPHAGOGASTRODUODENOSCOPY (EGD) WITH PROPOFOL
Anesthesia: General

## 2017-11-14 MED ORDER — LIDOCAINE HCL (PF) 2 % IJ SOLN
INTRAMUSCULAR | Status: AC
  Filled 2017-11-14: qty 10

## 2017-11-14 MED ORDER — GLYCOPYRROLATE 0.2 MG/ML IJ SOLN
INTRAMUSCULAR | Status: DC | PRN
  Administered 2017-11-14: 0.2 mg via INTRAVENOUS

## 2017-11-14 MED ORDER — SODIUM CHLORIDE 0.9 % IV SOLN
INTRAVENOUS | Status: DC
  Administered 2017-11-14: 1000 mL via INTRAVENOUS

## 2017-11-14 MED ORDER — LIDOCAINE HCL (CARDIAC) PF 100 MG/5ML IV SOSY
PREFILLED_SYRINGE | INTRAVENOUS | Status: DC | PRN
  Administered 2017-11-14: 60 mg via INTRAVENOUS

## 2017-11-14 MED ORDER — PROPOFOL 500 MG/50ML IV EMUL
INTRAVENOUS | Status: DC | PRN
  Administered 2017-11-14: 100 ug/kg/min via INTRAVENOUS

## 2017-11-14 MED ORDER — LIDOCAINE HCL (PF) 1 % IJ SOLN
INTRAMUSCULAR | Status: AC
  Administered 2017-11-14: 0.3 mL via INTRADERMAL
  Filled 2017-11-14: qty 2

## 2017-11-14 MED ORDER — LIDOCAINE HCL (PF) 1 % IJ SOLN
2.0000 mL | Freq: Once | INTRAMUSCULAR | Status: AC
  Administered 2017-11-14: 0.3 mL via INTRADERMAL

## 2017-11-14 MED ORDER — GLYCOPYRROLATE 0.2 MG/ML IJ SOLN
INTRAMUSCULAR | Status: AC
  Filled 2017-11-14: qty 1

## 2017-11-14 MED ORDER — PROPOFOL 500 MG/50ML IV EMUL
INTRAVENOUS | Status: AC
  Filled 2017-11-14: qty 50

## 2017-11-14 MED ORDER — SODIUM CHLORIDE 0.9 % IV SOLN
INTRAVENOUS | Status: DC | PRN
  Administered 2017-11-14: 10:00:00 via INTRAVENOUS

## 2017-11-14 MED ORDER — PROPOFOL 10 MG/ML IV BOLUS
INTRAVENOUS | Status: DC | PRN
  Administered 2017-11-14 (×2): 100 mg via INTRAVENOUS

## 2017-11-14 NOTE — Anesthesia Post-op Follow-up Note (Signed)
Anesthesia QCDR form completed.        

## 2017-11-14 NOTE — Op Note (Signed)
[facility] Gastroenterology Patient Name: Kevin Roberts Procedure Date: 11/14/2017 9:29 AM MRN: [id number] Account #: [id number] Date of Birth: 04/25/1945 Admit Type: Outpatient Age: 72 Room: [facility] ENDO ROOM 1 Gender: Male Note Status: Finalized Procedure:            Upper GI endoscopy Indications:          Epigastric abdominal pain Providers:            Robert Bellow, MD Referring MD:         Bethena Roys. Sowles, MD (Referring MD) Medicines:            Monitored Anesthesia Care Complications:        No immediate complications. Procedure:            Pre-Anesthesia Assessment:                       - Prior to the procedure, a History and Physical was                        performed, and patient medications, allergies and                        sensitivities were reviewed. The patient's tolerance of                        previous anesthesia was reviewed.                       - The risks and benefits of the procedure and the                        sedation options and risks were discussed with the                        patient. All questions were answered and informed                        consent was obtained.                       After obtaining informed consent, the endoscope was                        passed under direct vision. Throughout the procedure,                        the patient's blood pressure, pulse, and oxygen                        saturations were monitored continuously. The Endoscope                        was introduced through the mouth, and advanced to the                        second part of duodenum. The upper GI endoscopy was                        accomplished without difficulty. The patient tolerated  the procedure well. Findings:      A small hiatal hernia was present.      A large diverticulum with a moderate sized opening and no stigmata of       recent bleeding was found at the gastroesophageal  junction.      The stomach was normal.      The examined duodenum was normal. Impression:           - Small hiatal hernia.                       - Diverticulum at the gastroesophageal junction.                       - Normal stomach.                       - Normal examined duodenum.                       - No specimens collected. Recommendation:       - Return to endoscopist in 2 weeks. Procedure Code(s):    --- Professional ---                       [phone], Esophagogastroduodenoscopy, flexible, transoral;                        diagnostic, including collection of specimen(s) by                        brushing or washing, when performed (separate procedure) Diagnosis Code(s):    --- Professional ---                       K44.9, Diaphragmatic hernia without obstruction or                        gangrene                       R10.13, Epigastric pain CPT copyright 2017 American Medical Association. All rights reserved. The codes documented in this report are preliminary and upon coder review may  be revised to meet current compliance requirements. Robert Bellow, MD 11/14/2017 10:30:09 AM This report has been signed electronically. Number of Addenda: 0 Note Initiated On: 11/14/2017 9:29 AM      [facility]

## 2017-11-14 NOTE — Anesthesia Postprocedure Evaluation (Signed)
Anesthesia Post Note  Patient: Kevin Roberts  Procedure(s) Performed: ESOPHAGOGASTRODUODENOSCOPY (EGD) WITH PROPOFOL (N/A )  Patient location during evaluation: Endoscopy Anesthesia Type: General Level of consciousness: awake and alert, oriented and patient cooperative Pain management: satisfactory to patient Vital Signs Assessment: post-procedure vital signs reviewed and stable Respiratory status: spontaneous breathing and respiratory function stable Cardiovascular status: blood pressure returned to baseline and stable Postop Assessment: no headache, no backache, adequate PO intake, able to ambulate, no apparent nausea or vomiting and patient able to bend at knees Anesthetic complications: no     Last Vitals:  Vitals:   11/14/17 0953 11/14/17 1034  BP: 113/60 120/60  Pulse: (!) 45 81  Resp: 17 12  Temp: (!) 35.9 C (!) 36.1 C  SpO2: 100% 94%    Last Pain:  Vitals:   11/14/17 1034  TempSrc: Tympanic  PainSc: Asleep                 Halil Rentz H Jevonte Clanton

## 2017-11-14 NOTE — H&P (Addendum)
No change in clinical history or exam.  Atypical epigastric pain.  Abdominal ultrasound: Normal. Past EGD showed distal esophageal diverticulum.  For EGD.

## 2017-11-14 NOTE — Anesthesia Preprocedure Evaluation (Addendum)
Anesthesia Evaluation  Patient identified by MRN, date of birth, ID band Patient awake    Reviewed: Allergy & Precautions, H&P , NPO status , reviewed documented beta blocker date and time   Airway Mallampati: II  TM Distance: >3 FB Neck ROM: limited    Dental  (+) Upper Dentures, Partial Lower   Pulmonary COPD, Current Smoker,    Pulmonary exam normal        Cardiovascular hypertension, + angina + CAD and + Peripheral Vascular Disease  Normal cardiovascular exam  06/2015 Cath  The left ventricular systolic function is normal.  Prox LAD lesion, 20% stenosed.  Mid LAD lesion, 40% stenosed.  Ost 2nd Diag to 2nd Diag lesion, 30% stenosed.  Ost 1st Diag to 1st Diag lesion, 20% stenosed.  Mid Cx lesion, 20% stenosed.  Prox RCA lesion, 20% stenosed.   1. Mild to moderate nonobstructive coronary artery disease. The coronary arteries are overall moderately to heavily calcified especially the LAD.  2. Normal LV systolic function and high normal left ventricular end-diastolic pressure.   Neuro/Psych  Neuromuscular disease    GI/Hepatic GERD  ,  Endo/Other    Renal/GU Renal disease     Musculoskeletal   Abdominal   Peds  Hematology   Anesthesia Other Findings Past Medical History: No date: Benign prostatic hypertrophy No date: CAD (coronary atherosclerotic disease) No date: Chronic kidney disease, stage III (moderate) (HCC) No date: COPD (chronic obstructive pulmonary disease) (HCC) No date: Cramps, muscle, general No date: Elevated ferritin No date: GERD (gastroesophageal reflux disease) No date: History of hypercalcemia No date: Hyperlipidemia No date: Hypertension No date: Incomplete bladder emptying No date: Nocturia No date: Peripheral polyneuropathy No date: Rising PSA level No date: Tobacco use No date: Urgency of micturation No date: Vitamin B 12 deficiency  Past Surgical History: 07/02/2015:  CARDIAC CATHETERIZATION; N/A     Comment:  Procedure: Left Heart Cath and Coronary Angiography;                Surgeon: Wellington Hampshire, MD;  Location: [place]               CV [facility];  Service: Cardiovascular;  Laterality: N/A; 2011: COLONOSCOPY     Comment:  JWB No date: throat biopsy     Comment:  lump removed  BMI    Body Mass Index:  22.04 kg/m      Reproductive/Obstetrics                            Anesthesia Physical Anesthesia Plan  ASA: III  Anesthesia Plan: General   Post-op Pain Management:    Induction: Intravenous  PONV Risk Score and Plan: 1 and Treatment may vary due to age or medical condition and TIVA  Airway Management Planned: Nasal Cannula and Natural Airway  Additional Equipment:   Intra-op Plan:   Post-operative Plan:   Informed Consent: I have reviewed the patients History and Physical, chart, labs and discussed the procedure including the risks, benefits and alternatives for the proposed anesthesia with the patient or authorized representative who has indicated his/her understanding and acceptance.   Dental Advisory Given  Plan Discussed with: CRNA  Anesthesia Plan Comments:         Anesthesia Quick Evaluation

## 2017-11-14 NOTE — Transfer of Care (Signed)
Immediate Anesthesia Transfer of Care Note  Patient: Kevin Roberts  Procedure(s) Performed: ESOPHAGOGASTRODUODENOSCOPY (EGD) WITH PROPOFOL (N/A )  Patient Location: PACU and Endoscopy Unit  Anesthesia Type:General  Level of Consciousness: drowsy and patient cooperative  Airway & Oxygen Therapy: Patient Spontanous Breathing  Post-op Assessment: Report given to RN, Post -op Vital signs reviewed and stable and Patient moving all extremities  Post vital signs: Reviewed and stable  Last Vitals:  Vitals Value Taken Time  BP 120/60 11/14/2017 10:34 AM  Temp 36.1 C 11/14/2017 10:34 AM  Pulse 81 11/14/2017 10:35 AM  Resp 21 11/14/2017 10:35 AM  SpO2 95 % 11/14/2017 10:35 AM  Vitals shown include unvalidated device data.  Last Pain:  Vitals:   11/14/17 1034  TempSrc: Tympanic  PainSc: Asleep         Complications: No apparent anesthesia complications

## 2017-12-05 ENCOUNTER — Other Ambulatory Visit: Payer: Self-pay

## 2017-12-05 DIAGNOSIS — J449 Chronic obstructive pulmonary disease, unspecified: Secondary | ICD-10-CM

## 2017-12-05 MED ORDER — UMECLIDINIUM-VILANTEROL 62.5-25 MCG/INH IN AEPB: 1.0000 | INHALATION_SPRAY | Freq: Every day | RESPIRATORY_TRACT | 3 refills | Status: DC

## 2017-12-05 NOTE — Telephone Encounter (Signed)
Refill request was sent to Dr. Krichna Sowles for approval and submission.  

## 2017-12-07 ENCOUNTER — Ambulatory Visit (INDEPENDENT_AMBULATORY_CARE_PROVIDER_SITE_OTHER): Payer: Medicare Other | Admitting: Cardiovascular Disease

## 2017-12-07 ENCOUNTER — Encounter: Payer: Self-pay | Admitting: Cardiovascular Disease

## 2017-12-07 VITALS — BP 130/74 | HR 71 | Ht 71.0 in | Wt 156.0 lb

## 2017-12-07 DIAGNOSIS — Z720 Tobacco use: Secondary | ICD-10-CM

## 2017-12-07 DIAGNOSIS — I10 Essential (primary) hypertension: Secondary | ICD-10-CM

## 2017-12-07 DIAGNOSIS — E785 Hyperlipidemia, unspecified: Secondary | ICD-10-CM | POA: Diagnosis not present

## 2017-12-07 DIAGNOSIS — I2510 Atherosclerotic heart disease of native coronary artery without angina pectoris: Secondary | ICD-10-CM | POA: Diagnosis not present

## 2017-12-07 NOTE — Progress Notes (Signed)
Cardiology Office Note   Date:  12/07/2017   ID:  Kevin Roberts, DOB November 25, 1945, MRN [id number]  PCP:  Steele Sizer, MD  Cardiologist:   [name], MD   Chief Complaint  Patient presents with  . other    12 mo f/u OD last seen 08/03/16. Medications verbally reviewed. "Doing Well"      History of Present Illness: Kevin Roberts is a 72 y.o. male who is here today for a follow-up visit regarding mild to moderate calcified coronary artery disease. He has extensive risk factors including hypertension, hyperlipidemia and prolonged history of tobacco use. He smokes one and a half pack per day. He also has COPD.  Cardiac catheterization in 2017 showed mild to moderate nonobstructive coronary artery disease with overall moderately to heavily calcified arteries especially the LAD. Ejection fraction was normal as well as left ventricular end-diastolic pressure. He had an abdominal aortic ultrasound in 2016 which showed no evidence of aortic aneurysm.  He had recent episodes of right-sided chest pain described as sharp discomfort mostly happening at night and not with physical activities.  He continues to smoke.  He does have known history of GERD.  He was noted to be more bradycardic when he saw his primary care physician and the dose of Toprol was decreased to 12-1/2 mg once daily.  Past Medical History:  Diagnosis Date  . Benign prostatic hypertrophy   . CAD (coronary atherosclerotic disease)   . Chronic kidney disease, stage III (moderate) (HCC)   . COPD (chronic obstructive pulmonary disease) ([place])   . Cramps, muscle, general   . Elevated ferritin   . GERD (gastroesophageal reflux disease)   . History of hypercalcemia   . Hyperlipidemia   . Hypertension   . Incomplete bladder emptying   . Nocturia   . Peripheral polyneuropathy   . Rising PSA level   . Tobacco use   . Urgency of micturation   . Vitamin B 12 deficiency     Past Surgical History:  Procedure Laterality  Date  . CARDIAC CATHETERIZATION N/A 07/02/2015   Procedure: Left Heart Cath and Coronary Angiography;  Surgeon: Wellington Hampshire, MD;  Location: [place] [facility];  Service: Cardiovascular;  Laterality: N/A;  . COLONOSCOPY  2011   JWB  . ESOPHAGOGASTRODUODENOSCOPY (EGD) WITH PROPOFOL N/A 11/14/2017   Procedure: ESOPHAGOGASTRODUODENOSCOPY (EGD) WITH PROPOFOL;  Surgeon: Robert Bellow, MD;  Location: [facility] ENDOSCOPY;  Service: Endoscopy;  Laterality: N/A;  . throat biopsy     lump removed     Current Outpatient Medications  Medication Sig Dispense Refill  . aspirin 81 MG tablet Take 1 tablet by mouth daily.    Marland Kitchen atorvastatin (LIPITOR) 40 MG tablet Take 1 tablet (40 mg total) by mouth daily. 90 tablet 1  . Cyanocobalamin (B-12) 1000 MCG TBCR Take 1 tablet by mouth daily.    Marland Kitchen gabapentin (NEURONTIN) 300 MG capsule Take 1 capsule (300 mg total) by mouth 2 (two) times daily. 180 capsule 1  . metoprolol succinate (TOPROL-XL) 25 MG 24 hr tablet Take 0.5 tablets (12.5 mg total) by mouth daily. 45 tablet 0  . MULTIPLE VITAMIN PO Take 1 tablet by mouth daily.    . nitroGLYCERIN (NITROSTAT) 0.4 MG SL tablet Place 1 tablet (0.4 mg total) under the tongue every 5 (five) minutes as needed for chest pain. 30 tablet 0  . pantoprazole (PROTONIX) 40 MG tablet Take 1 tablet (40 mg total) by mouth daily. 90 tablet 1  .  tamsulosin (FLOMAX) 0.4 MG CAPS capsule Take 1 capsule (0.4 mg total) by mouth daily. 90 capsule 2  . umeclidinium-vilanterol (ANORO ELLIPTA) 62.5-25 MCG/INH AEPB Inhale 1 puff into the lungs daily. 60 each 3  . Vitamin D, Cholecalciferol, 1000 UNITS CAPS Take 1 tablet by mouth daily.     No current facility-administered medications for this visit.     Allergies:   Patient has no known allergies.    Social History:  The patient  reports that he has been smoking cigarettes. He started smoking about 44 years ago. He has a 41.00 pack-year smoking history. He has never used smokeless  tobacco. He reports that he does not drink alcohol or use drugs.   Family History:  The patient's family history includes CVA in his mother; Cancer in his father; GER disease in his son; Healthy in his sister and sister; Heart Problems in his brother; Hypertension in his brother and mother; Mental illness in his brother.    ROS:  Please see the history of present illness.   Otherwise, review of systems are positive for none.   All other systems are reviewed and negative.    PHYSICAL EXAM: VS:  BP 130/74 (BP Location: Left Arm, Patient Position: Sitting, Cuff Size: Normal)   Pulse 71   Ht 5\' 11"  (1.803 m)   Wt 156 lb (70.8 kg)   BMI 21.76 kg/m  , BMI Body mass index is 21.76 kg/m. GEN: Well nourished, well developed, in no acute distress  HEENT: normal  Neck: no JVD, carotid bruits, or masses Cardiac: RRR; no murmurs, rubs, or gallops,no edema  Respiratory:  clear to auscultation bilaterally, normal work of breathing GI: soft, nontender, nondistended, + BS MS: no deformity or atrophy  Skin: warm and dry, no rash Neuro:  Strength and sensation are intact Psych: euthymic mood, full affect   EKG:  EKG is  ordered today. EKG showed normal sinus rhythm with specific T wave changes.   Recent Labs: 09/20/2017: ALT 16; BUN 8; Creat 1.48; Hemoglobin 14.9; Platelets 170; Potassium 4.4; Sodium 141    Lipid Panel    Component Value Date/Time   CHOL 89 09/20/2017 1053   CHOL 92 (L) 12/03/2014 1216   TRIG 73 09/20/2017 1053   HDL 46 09/20/2017 1053   HDL 49 12/03/2014 1216   CHOLHDL 1.9 09/20/2017 1053   VLDL 11 09/20/2016 0810   LDLCALC 28 09/20/2017 1053      Wt Readings from Last 3 Encounters:  12/07/17 156 lb (70.8 kg)  11/14/17 158 lb (71.7 kg)  10/25/17 158 lb (71.7 kg)         ASSESSMENT AND PLAN:  1.  Coronary artery disease involving native coronary artery without angina: Continue medical therapy and low-dose aspirin. He had intermittent episodes of chest  discomfort few months ago but none in the last month.  The symptoms were very atypical mostly at rest and at night and the pain was right-sided.  I do not think that was cardiac in etiology.  He has no exertional symptoms to suggest angina.  I suspect that his symptoms were GI in nature. I recommend continuing medical therapy.  2. Hyperlipidemia: Continue treatment with atorvastatin. Most recent LDL was 28.  3. Tobacco use: I again discussed with him the importance of smoking cessation and provided him with written instructions.  4.  Essential hypertension: Blood pressure is controlled.  I agree with decreasing Toprol due to bradycardia.  We can consider stopping small dose metoprolol altogether  if needed in the future and monitor his blood pressure.   Disposition:   FU with me in one year   Signed,  [name], MD  12/07/2017 8:16 AM    [place]

## 2017-12-07 NOTE — Patient Instructions (Addendum)
Medication Instructions: Your physician recommends that you continue on your current medications as directed. Please refer to the Current Medication list given to you today.  If you need a refill on your cardiac medications before your next appointment, please call your pharmacy.   Follow-Up: Your physician wants you to follow-up in 12 months with Dr. Fletcher Anon. You will receive a reminder letter in the mail two months in advance. If you don't receive a letter, please call our office at [phone] to schedule this follow-up appointment.   Thank you for choosing [facility] at [facility]!      Steps to Quit Smoking Smoking tobacco can be bad for your health. It can also affect almost every organ in your body. Smoking puts you and people around you at risk for many serious long-lasting (chronic) diseases. Quitting smoking is hard, but it is one of the best things that you can do for your health. It is never too late to quit. What are the benefits of quitting smoking? When you quit smoking, you lower your risk for getting serious diseases and conditions. They can include:  Lung cancer or lung disease.  Heart disease.  Stroke.  Heart attack.  Not being able to have children (infertility).  Weak bones (osteoporosis) and broken bones (fractures).  If you have coughing, wheezing, and shortness of breath, those symptoms may get better when you quit. You may also get sick less often. If you are pregnant, quitting smoking can help to lower your chances of having a baby of low birth weight. What can I do to help me quit smoking? Talk with your doctor about what can help you quit smoking. Some things you can do (strategies) include:  Quitting smoking totally, instead of slowly cutting back how much you smoke over a period of time.  Going to in-person counseling. You are more likely to quit if you go to many counseling sessions.  Using resources and support systems, such as: ? Database administrator  with a Social worker. ? Phone quitlines. ? Careers information officer. ? Support groups or group counseling. ? Text messaging programs. ? Mobile phone apps or applications.  Taking medicines. Some of these medicines may have nicotine in them. If you are pregnant or breastfeeding, do not take any medicines to quit smoking unless your doctor says it is okay. Talk with your doctor about counseling or other things that can help you.  Talk with your doctor about using more than one strategy at the same time, such as taking medicines while you are also going to in-person counseling. This can help make quitting easier. What things can I do to make it easier to quit? Quitting smoking might feel very hard at first, but there is a lot that you can do to make it easier. Take these steps:  Talk to your family and friends. Ask them to support and encourage you.  Call phone quitlines, reach out to support groups, or work with a Social worker.  Ask people who smoke to not smoke around you.  Avoid places that make you want (trigger) to smoke, such as: ? Bars. ? Parties. ? Smoke-break areas at work.  Spend time with people who do not smoke.  Lower the stress in your life. Stress can make you want to smoke. Try these things to help your stress: ? Getting regular exercise. ? Deep-breathing exercises. ? Yoga. ? Meditating. ? Doing a body scan. To do this, close your eyes, focus on one area of your body at a time  from head to toe, and notice which parts of your body are tense. Try to relax the muscles in those areas.  Download or buy apps on your mobile phone or tablet that can help you stick to your quit plan. There are many free apps, such as QuitGuide from the [employer] Office manager for Disease Control and Prevention). You can find more support from smokefree.gov and other websites.  This information is not intended to replace advice given to you by your health care provider. Make sure you discuss any questions you  have with your health care provider. Document Released: 01/21/2009 Document Revised: 11/23/2015 Document Reviewed: 08/11/2014 Elsevier Interactive Patient Education  2018 [employer].

## 2017-12-11 DIAGNOSIS — H2513 Age-related nuclear cataract, bilateral: Secondary | ICD-10-CM | POA: Diagnosis not present

## 2018-01-22 ENCOUNTER — Ambulatory Visit (INDEPENDENT_AMBULATORY_CARE_PROVIDER_SITE_OTHER): Payer: Medicare Other | Admitting: Family Medicine

## 2018-01-22 ENCOUNTER — Encounter: Payer: Self-pay | Admitting: Family Medicine

## 2018-01-22 VITALS — BP 128/70 | HR 61 | Temp 98.2°F | Ht 71.0 in | Wt 155.0 lb

## 2018-01-22 DIAGNOSIS — I2510 Atherosclerotic heart disease of native coronary artery without angina pectoris: Secondary | ICD-10-CM | POA: Diagnosis not present

## 2018-01-22 DIAGNOSIS — N183 Chronic kidney disease, stage 3 unspecified: Secondary | ICD-10-CM

## 2018-01-22 DIAGNOSIS — Z23 Encounter for immunization: Secondary | ICD-10-CM | POA: Diagnosis not present

## 2018-01-22 DIAGNOSIS — K219 Gastro-esophageal reflux disease without esophagitis: Secondary | ICD-10-CM

## 2018-01-22 DIAGNOSIS — E538 Deficiency of other specified B group vitamins: Secondary | ICD-10-CM

## 2018-01-22 DIAGNOSIS — J449 Chronic obstructive pulmonary disease, unspecified: Secondary | ICD-10-CM

## 2018-01-22 DIAGNOSIS — K449 Diaphragmatic hernia without obstruction or gangrene: Secondary | ICD-10-CM

## 2018-01-22 DIAGNOSIS — I700 Atherosclerosis of aorta: Secondary | ICD-10-CM

## 2018-01-22 DIAGNOSIS — F1021 Alcohol dependence, in remission: Secondary | ICD-10-CM

## 2018-01-22 DIAGNOSIS — G63 Polyneuropathy in diseases classified elsewhere: Secondary | ICD-10-CM

## 2018-01-22 DIAGNOSIS — E785 Hyperlipidemia, unspecified: Secondary | ICD-10-CM

## 2018-01-22 DIAGNOSIS — Z8719 Personal history of other diseases of the digestive system: Secondary | ICD-10-CM

## 2018-01-22 DIAGNOSIS — I10 Essential (primary) hypertension: Secondary | ICD-10-CM

## 2018-01-22 DIAGNOSIS — Z8711 Personal history of peptic ulcer disease: Secondary | ICD-10-CM

## 2018-01-22 MED ORDER — PANTOPRAZOLE SODIUM 40 MG PO TBEC: 40.0000 mg | DELAYED_RELEASE_TABLET | Freq: Every day | ORAL | 1 refills | Status: DC

## 2018-01-22 MED ORDER — ATORVASTATIN CALCIUM 40 MG PO TABS: 40.0000 mg | ORAL_TABLET | Freq: Every day | ORAL | 1 refills | Status: DC

## 2018-01-22 MED ORDER — METOPROLOL SUCCINATE ER 25 MG PO TB24: 12.5000 mg | ORAL_TABLET | Freq: Every day | ORAL | 1 refills | Status: DC

## 2018-01-22 NOTE — Progress Notes (Signed)
Name: Kevin Roberts   MRN: [id number]    DOB: [birth year]   Date:01/22/2018       Progress Note  Subjective  Chief Complaint  Chief Complaint  Patient presents with  . Follow-up    HPI  COPD Mild: he is taking Anoro,  states no longer coughing, he also denies SOB at this time. No wheezing.He still smokes 1.5 packs daily, CT chest showed emphysema. He is doing well on medication.   History of Alcoholism: he used to drink before retirement - about 2 shots at night, however when he retired at age 72 he started to drink about 1/5 of liquor every two days. He states he stopped drinking after two years because he fell on his face, developed a gastric ulcer and B12 deficiency. He is doing well since he stopped drinkingaround 2010.He still has burning on his legs from neuropathy, taking gabapentin, symptoms not as intense and not as constant, still bothersome   HTN: taking medication, he denies dizziness, but feels tired,intermittent chest pain was happening often a few months ago, but seen by Dr. Fletcher Anon and given reassurance, he states no recent episodes of chest pain.   GERD and history of gastric ulcer: He states symptoms controlled as long as he takes a PPI, no heartburn or regurgitation. He was seen by Dr. Fleet Contras and had EGD in August, large diverticulum but no signs of bleeding and has hiatal hernia small in size   Hyperlipidemia: taking Atorvastatin and denies side effects. Taking aspirin daily . Denies myalgia. Recheck labs today   CAD: found on CT chest 3 vessel disease he denies chest pain ,it may be multifactorial from COPD also. He was seen by Dr. Fletcher Anon and advised to quit smoking. On aspirin, statins and beta-blocker. Cardiac cath in 2017 showed mild to moderate non-obstructive disease and is only on medical management. He saw Dr. Fletcher Anon in May 2018 ,he was having more frequent episodes of right upper chest pain, but symptoms has resolved, no recent episodes.   B12  neuropathy: he is taking B12 supplementation and also takes Neurontin - symptoms are stable. Continue supplementation   Tobacco Abuse: he has been a smoker for 42 years, he is up to date with triple A screening, negative in 2016, chest CT done in 11/2013 and lat one 06/2017 . Explained importance of quitting smoking again  Atherosclerosis abdominal aorta: on medical management, aspirin, beta-blocker and statin therapy. He is aware he needs to quit smoking   CKI: stage III, advised to avoid nsaid's, drink water    Patient Active Problem List   Diagnosis Date Noted  . Abdominal pain, chronic, right lower quadrant 10/26/2017  . Esophageal diverticulum 10/26/2017  . History of alcoholism ([place]) 01/18/2016  . Gynecomastia 10/14/2015  . Abnormal nuclear stress test   . Anginal equivalent ([place])   . Atherosclerosis of aorta ([place]) 04/15/2015  . BPH with obstruction/lower urinary tract symptoms 02/03/2015  . Renal cyst, left 10/13/2014  . Tobacco use   . B12 neuropathy ([place]) 10/04/2014  . CAD, multiple vessel 10/04/2014  . Benign essential HTN 10/04/2014  . Chronic kidney disease (CKD), stage III (moderate) ([place]) 10/04/2014  . Dyslipidemia 10/04/2014  . COPD, mild ([place]) 10/04/2014  . Gastro-esophageal reflux disease without esophagitis 10/04/2014  . Enlarged prostate 10/04/2014  . Vitamin D deficiency 10/04/2014    Past Surgical History:  Procedure Laterality Date  . CARDIAC CATHETERIZATION N/A 07/02/2015   Procedure: Left Heart Cath and Coronary Angiography;  Surgeon: Wellington Hampshire,  MD;  Location: [facility];  Service: Cardiovascular;  Laterality: N/A;  . COLONOSCOPY  2011   JWB  . ESOPHAGOGASTRODUODENOSCOPY (EGD) WITH PROPOFOL N/A 11/14/2017   Procedure: ESOPHAGOGASTRODUODENOSCOPY (EGD) WITH PROPOFOL;  Surgeon: Robert Bellow, MD;  Location: [facility];  Service: Endoscopy;  Laterality: N/A;  . throat biopsy     lump removed    Family History  Problem  Relation Age of Onset  . Hypertension Mother   . CVA Mother   . Cancer Father        Lung  . Mental illness Brother        1-Schizophrenia  . GER disease Son   . Healthy Sister   . Heart Problems Brother   . Healthy Sister   . Hypertension Brother     Social History   Socioeconomic History  . Marital status: Married    Spouse name: Claudine  . Number of children: 3  . Years of education: some college  . Highest education level: 12th grade  Occupational History  . Occupation: Retired  Scientific laboratory technician  . Financial resource strain: Not hard at all  . Food insecurity:    Worry: Never true    Inability: Never true  . Transportation needs:    Medical: No    Non-medical: No  Tobacco Use  . Smoking status: Current Every Day Smoker    Packs/day: 1.00    Years: 41.00    Pack years: 41.00    Types: Cigarettes    Start date: 10/04/1973  . Smokeless tobacco: Never Used  Substance and Sexual Activity  . Alcohol use: No    Alcohol/week: 0.0 standard drinks  . Drug use: No  . Sexual activity: Not Currently    Partners: Female  Lifestyle  . Physical activity:    Days per week: 0 days    Minutes per session: 0 min  . Stress: Not at all  Relationships  . Social connections:    Talks on phone: Patient refused    Gets together: Patient refused    Attends religious service: Patient refused    Active member of club or organization: Patient refused    Attends meetings of clubs or organizations: Patient refused    Relationship status: Married  . Intimate partner violence:    Fear of current or ex partner: No    Emotionally abused: No    Physically abused: No    Forced sexual activity: No  Other Topics Concern  . Not on file  Social History Narrative  . Not on file     Current Outpatient Medications:  .  aspirin 81 MG tablet, Take 1 tablet by mouth daily., Disp: , Rfl:  .  atorvastatin (LIPITOR) 40 MG tablet, Take 1 tablet (40 mg total) by mouth daily., Disp: 90 tablet,  Rfl: 1 .  Cyanocobalamin (B-12) 1000 MCG TBCR, Take 1 tablet by mouth daily., Disp: , Rfl:  .  gabapentin (NEURONTIN) 300 MG capsule, Take 1 capsule (300 mg total) by mouth 2 (two) times daily., Disp: 180 capsule, Rfl: 1 .  metoprolol succinate (TOPROL-XL) 25 MG 24 hr tablet, Take 0.5 tablets (12.5 mg total) by mouth daily., Disp: 45 tablet, Rfl: 0 .  MULTIPLE VITAMIN PO, Take 1 tablet by mouth daily., Disp: , Rfl:  .  nitroGLYCERIN (NITROSTAT) 0.4 MG SL tablet, Place 1 tablet (0.4 mg total) under the tongue every 5 (five) minutes as needed for chest pain., Disp: 30 tablet, Rfl: 0 .  pantoprazole (PROTONIX)  40 MG tablet, Take 1 tablet (40 mg total) by mouth daily., Disp: 90 tablet, Rfl: 1 .  tamsulosin (FLOMAX) 0.4 MG CAPS capsule, Take 1 capsule (0.4 mg total) by mouth daily., Disp: 90 capsule, Rfl: 2 .  umeclidinium-vilanterol (ANORO ELLIPTA) 62.5-25 MCG/INH AEPB, Inhale 1 puff into the lungs daily., Disp: 60 each, Rfl: 3 .  Vitamin D, Cholecalciferol, 1000 UNITS CAPS, Take 1 tablet by mouth daily., Disp: , Rfl:   No Known Allergies  I personally reviewed active problem list, medication list, allergies, family history, social history with the patient/caregiver today.   ROS  Constitutional: Negative for fever or weight change.  Respiratory:Positive for cough and shortness of breath.   Cardiovascular: Negative for chest pain, but has occasional  palpitations.  Gastrointestinal: Negative for abdominal pain, no bowel changes.  Musculoskeletal: Negative for gait problem or joint swelling.  Skin: Negative for rash.  Neurological: Negative for dizziness or headache.  No other specific complaints in a complete review of systems (except as listed in HPI above).  Objective  Vitals:   01/22/18 0854  BP: 128/70  Pulse: 61  Temp: 98.2 F (36.8 C)  SpO2: 98%  Weight: 155 lb (70.3 kg)  Height: 5\' 11"  (1.803 m)    Body mass index is 21.62 kg/m.  Physical Exam  Constitutional: Patient  appears well-developed and well-nourished, he is thin.  No distress.  HEENT: head atraumatic, normocephalic, pupils equal and reactive to light, neck supple, throat within normal limits Cardiovascular: Normal rate, regular rhythm and normal heart sounds.  No murmur heard. No BLE edema. Pulmonary/Chest: Effort normal and breath sounds normal. No respiratory distress. Abdominal: Soft.  There is no tenderness. Psychiatric: Patient has a normal mood and affect. behavior is normal. Judgment and thought content normal.   PHQ2/9: Depression screen [facility] 2/9 01/22/2018 10/04/2017 09/20/2017 05/25/2017 09/20/2016  Decreased Interest 0 0 0 0 0  Down, Depressed, Hopeless 0 0 0 0 0  PHQ - 2 Score 0 0 0 0 0  Altered sleeping - 0 - - -  Tired, decreased energy - 0 - - -  Change in appetite - 0 - - -  Feeling bad or failure about yourself  - 0 - - -  Trouble concentrating - 0 - - -  Moving slowly or fidgety/restless - 0 - - -  Suicidal thoughts - 0 - - -  PHQ-9 Score - 0 - - -  Difficult doing work/chores - Not difficult at all - - -     Fall Risk: Fall Risk  01/22/2018 10/04/2017 09/20/2017 05/25/2017 01/22/2017  Falls in the past year? No No No No No  Risk for fall due to : - Impaired vision - - -  Risk for fall due to: Comment - wears eyeglasses - - -     Functional Status Survey: Is the patient deaf or have difficulty hearing?: No Does the patient have difficulty seeing, even when wearing glasses/contacts?: No Does the patient have difficulty concentrating, remembering, or making decisions?: Yes Does the patient have difficulty walking or climbing stairs?: No Does the patient have difficulty dressing or bathing?: No Does the patient have difficulty doing errands alone such as visiting a doctor's office or shopping?: No    Assessment & Plan  1. B12 neuropathy ([place])   2. Need for influenza vaccination  - Flu vaccine HIGH DOSE PF (Fluzone High dose)  3. Dyslipidemia  - atorvastatin  (LIPITOR) 40 MG tablet; Take 1 tablet (40 mg total) by mouth  daily.  Dispense: 90 tablet; Refill: 1  4. Atherosclerosis of aorta (HCC)  - metoprolol succinate (TOPROL-XL) 25 MG 24 hr tablet; Take 0.5 tablets (12.5 mg total) by mouth daily.  Dispense: 45 tablet; Refill: 1  5. Benign essential HTN  - metoprolol succinate (TOPROL-XL) 25 MG 24 hr tablet; Take 0.5 tablets (12.5 mg total) by mouth daily.  Dispense: 45 tablet; Refill: 1  6. History of gastric ulcer  - pantoprazole (PROTONIX) 40 MG tablet; Take 1 tablet (40 mg total) by mouth daily.  Dispense: 90 tablet; Refill: 1  7. GERD without esophagitis  - pantoprazole (PROTONIX) 40 MG tablet; Take 1 tablet (40 mg total) by mouth daily.  Dispense: 90 tablet; Refill: 1  8. COPD, mild  (HCC)  Continue Anoro  9. History of alcoholism ([place])  Doing well still not drinking   10. CAD, multiple vessel  Under the care of Dr. Fletcher Anon on lower dose metoprolol and heart rate is good today in the 60's  11. Chronic kidney disease (CKD), stage III (moderate) (HCC)  We will continue to monitor kidney function

## 2018-01-30 ENCOUNTER — Ambulatory Visit (INDEPENDENT_AMBULATORY_CARE_PROVIDER_SITE_OTHER): Payer: Medicare Other | Admitting: Urology

## 2018-01-30 ENCOUNTER — Encounter: Payer: Self-pay | Admitting: Urology

## 2018-01-30 VITALS — BP 114/67 | HR 72 | Ht 71.0 in | Wt 158.4 lb

## 2018-01-30 DIAGNOSIS — N401 Enlarged prostate with lower urinary tract symptoms: Secondary | ICD-10-CM | POA: Diagnosis not present

## 2018-01-30 DIAGNOSIS — R350 Frequency of micturition: Secondary | ICD-10-CM | POA: Diagnosis not present

## 2018-01-30 DIAGNOSIS — I2510 Atherosclerotic heart disease of native coronary artery without angina pectoris: Secondary | ICD-10-CM

## 2018-01-30 DIAGNOSIS — N138 Other obstructive and reflux uropathy: Secondary | ICD-10-CM

## 2018-01-30 MED ORDER — TAMSULOSIN HCL 0.4 MG PO CAPS: 0.4000 mg | ORAL_CAPSULE | Freq: Every day | ORAL | 3 refills | Status: DC

## 2018-01-30 NOTE — Progress Notes (Signed)
01/30/2018 9:44 AM   Kevin Roberts [birth year] [id number]  Referring provider: Steele Sizer, MD [address] [place], [place] [address]  No chief complaint on file.   HPI: Patient is a 72 year old African-American male who presents today for his yearly office visit for BPH with LUTS managed with tamsulosin.  BPH WITH LUTS His IPSS score today is 1, which is mild lower urinary tract symptomatology. He is pleased with his quality life due to his urinary symptoms.  His previous IPSS score was 3/1.  His previous PVR is 27 mL.   He denies any dysuria, hematuria or suprapubic pain.  He currently taking tamsulosin 0.4 mg daily.  He also denies any recent fevers, chills, nausea or vomiting.   His baby brother has been diagnosed with prostate cancer, he is in his late 10's.    IPSS    Row Name 01/30/18 0900         International Prostate Symptom Score   How often have you had the sensation of not emptying your bladder?  Less than 1 in 5     How often have you had to urinate less than every two hours?  Less than 1 in 5 times     How often have you found you stopped and started again several times when you urinated?  Less than 1 in 5 times     How often have you found it difficult to postpone urination?  Less than 1 in 5 times     How often have you had a weak urinary stream?  Less than 1 in 5 times     How often have you had to strain to start urination?  Less than 1 in 5 times     How many times did you typically get up at night to urinate?  1 Time     Total IPSS Score  7       Quality of Life due to urinary symptoms   If you were to spend the rest of your life with your urinary condition just the way it is now how would you feel about that?  Pleased        Score:  1-7 Mild 8-19 Moderate 20-35 Severe     PMH: Past Medical History:  Diagnosis Date  . Benign prostatic hypertrophy   . CAD (coronary atherosclerotic disease)   . Chronic kidney disease, stage III  (moderate) (HCC)   . COPD (chronic obstructive pulmonary disease) ([place])   . Cramps, muscle, general   . Elevated ferritin   . GERD (gastroesophageal reflux disease)   . History of hypercalcemia   . Hyperlipidemia   . Hypertension   . Incomplete bladder emptying   . Nocturia   . Peripheral polyneuropathy   . Rising PSA level   . Tobacco use   . Urgency of micturation   . Vitamin B 12 deficiency     Surgical History: Past Surgical History:  Procedure Laterality Date  . CARDIAC CATHETERIZATION N/A 07/02/2015   Procedure: Left Heart Cath and Coronary Angiography;  Surgeon: Wellington Hampshire, MD;  Location: [facility];  Service: Cardiovascular;  Laterality: N/A;  . COLONOSCOPY  2011   JWB  . ESOPHAGOGASTRODUODENOSCOPY (EGD) WITH PROPOFOL N/A 11/14/2017   Procedure: ESOPHAGOGASTRODUODENOSCOPY (EGD) WITH PROPOFOL;  Surgeon: Robert Bellow, MD;  Location: [facility];  Service: Endoscopy;  Laterality: N/A;  . throat biopsy     lump removed    Home Medications:  Allergies  as of 01/30/2018   No Known Allergies     Medication List        Accurate as of 01/30/18  9:44 AM. Always use your most recent med list.          aspirin 81 MG tablet Take 1 tablet by mouth daily.   atorvastatin 40 MG tablet Commonly known as:  LIPITOR Take 1 tablet (40 mg total) by mouth daily.   B-12 1000 MCG Tbcr Take 1 tablet by mouth daily.   gabapentin 300 MG capsule Commonly known as:  NEURONTIN Take 1 capsule (300 mg total) by mouth 2 (two) times daily.   metoprolol succinate 25 MG 24 hr tablet Commonly known as:  TOPROL-XL Take 0.5 tablets (12.5 mg total) by mouth daily.   MULTIPLE VITAMIN PO Take 1 tablet by mouth daily.   nitroGLYCERIN 0.4 MG SL tablet Commonly known as:  NITROSTAT Place 1 tablet (0.4 mg total) under the tongue every 5 (five) minutes as needed for chest pain.   pantoprazole 40 MG tablet Commonly known as:  PROTONIX Take 1 tablet (40 mg total) by  mouth daily.   tamsulosin 0.4 MG Caps capsule Commonly known as:  FLOMAX Take 1 capsule (0.4 mg total) by mouth daily.   umeclidinium-vilanterol 62.5-25 MCG/INH Aepb Commonly known as:  ANORO ELLIPTA Inhale 1 puff into the lungs daily.   Vitamin D (Cholecalciferol) 1000 units Caps Take 1 tablet by mouth daily.       Allergies: No Known Allergies  Family History: Family History  Problem Relation Age of Onset  . Hypertension Mother   . CVA Mother   . Cancer Father        Lung  . Mental illness Brother        1-Schizophrenia  . GER disease Son   . Healthy Sister   . Heart Problems Brother   . Healthy Sister   . Hypertension Brother     Social History:  reports that he has been smoking cigarettes. He started smoking about 44 years ago. He has a 41.00 pack-year smoking history. He has never used smokeless tobacco. He reports that he does not drink alcohol or use drugs.  ROS: UROLOGY Frequent Urination?: No Hard to postpone urination?: No Burning/pain with urination?: No Get up at night to urinate?: No Leakage of urine?: No Urine stream starts and stops?: No Trouble starting stream?: No Do you have to strain to urinate?: No Blood in urine?: No Urinary tract infection?: No Sexually transmitted disease?: No Injury to kidneys or bladder?: No Painful intercourse?: No Weak stream?: No Penile pain?: No  Gastrointestinal Nausea?: No Vomiting?: No Indigestion/heartburn?: No Diarrhea?: No Constipation?: No  Constitutional Fever: No Night sweats?: No Weight loss?: No Fatigue?: No  Skin Skin rash/lesions?: No Itching?: No  Eyes Blurred vision?: No Double vision?: No  Ears/Nose/Throat Sore throat?: No Sinus problems?: No  Hematologic/Lymphatic Swollen glands?: No Easy bruising?: No  Cardiovascular Leg swelling?: No Chest pain?: No  Respiratory Cough?: No  Endocrine Excessive thirst?: No  Musculoskeletal Back pain?: No Joint pain?:  No  Neurological Headaches?: No Dizziness?: No  Psychologic Depression?: No Anxiety?: No  Physical Exam: BP 114/67 (BP Location: Left Arm, Patient Position: Sitting, Cuff Size: Normal)   Pulse 72   Ht 5\' 11"  (1.803 m)   Wt 158 lb 6.4 oz (71.8 kg)   BMI 22.09 kg/m   Constitutional: Well nourished. Alert and oriented, No acute distress. HEENT: [place] AT, moist mucus membranes. Trachea midline, no masses.  Cardiovascular: No clubbing, cyanosis, or edema. Respiratory: Normal respiratory effort, no increased work of breathing. GI: Abdomen is soft, non tender, non distended, no abdominal masses. Liver and spleen not palpable.  No hernias appreciated.  Stool sample for occult testing is not indicated.   GU: No CVA tenderness.  No bladder fullness or masses.  Patient with circumcised phallus.  Urethral meatus is patent.  No penile discharge. No penile lesions or rashes. Scrotum without lesions, cysts, rashes and/or edema.  Testicles are located scrotally bilaterally. No masses are appreciated in the testicles. Left and right epididymis are normal. Rectal: Patient with  normal sphincter tone. Anus and perineum without scarring or rashes. No rectal masses are appreciated. Prostate is approximately 60 grams, no nodules are appreciated. Seminal vesicles are normal. Skin: No rashes, bruises or suspicious lesions. Lymph: No cervical or inguinal adenopathy. Neurologic: Grossly intact, no focal deficits, moving all 4 extremities. Psychiatric: Normal mood and affect.    Laboratory Data: Lab Results  Component Value Date   CREATININE 1.48 (H) 09/20/2017   Lab Results  Component Value Date   PSA 2.0 09/23/2013   PSA history:  1.8 ng/mL on 02/02/2014  2.1 ng/mL on 01/27/2016  1.6 ng/mL on 01/25/2017  1. BPH with LUTS  - IPSS score is 7/1, it is worsening  - Continue conservative management, avoiding bladder irritants and timed voiding's  - Continue tamsulosin 0.4 mg daily  - RTC in 12 months  for IPSS, PSA and exam - if PSA is stable  Return in about 1 year (around 01/31/2019) for IPSS, PSA and exam.  Zara Council, [facility]  [address], [place] [address] [phone]

## 2018-01-31 ENCOUNTER — Telehealth: Payer: Self-pay

## 2018-01-31 LAB — PSA: PROSTATE SPECIFIC AG, SERUM: 1.9 ng/mL (ref 0.0–4.0)

## 2018-01-31 NOTE — Telephone Encounter (Signed)
-----   Message from Nori Riis, PA-C sent at 01/31/2018  8:19 AM EDT ----- Please let Mr. Sando know that his PSA is stable at 1.9.

## 2018-01-31 NOTE — Telephone Encounter (Signed)
Patient notified

## 2018-05-28 ENCOUNTER — Encounter: Payer: Self-pay | Admitting: Family Medicine

## 2018-05-28 ENCOUNTER — Ambulatory Visit (INDEPENDENT_AMBULATORY_CARE_PROVIDER_SITE_OTHER): Payer: Medicare Other | Admitting: Family Medicine

## 2018-05-28 VITALS — BP 116/62 | HR 70 | Temp 98.0°F | Resp 16 | Ht 71.0 in | Wt 158.7 lb

## 2018-05-28 DIAGNOSIS — J449 Chronic obstructive pulmonary disease, unspecified: Secondary | ICD-10-CM

## 2018-05-28 DIAGNOSIS — I10 Essential (primary) hypertension: Secondary | ICD-10-CM | POA: Diagnosis not present

## 2018-05-28 DIAGNOSIS — I208 Other forms of angina pectoris: Secondary | ICD-10-CM

## 2018-05-28 DIAGNOSIS — I700 Atherosclerosis of aorta: Secondary | ICD-10-CM

## 2018-05-28 DIAGNOSIS — E538 Deficiency of other specified B group vitamins: Secondary | ICD-10-CM | POA: Diagnosis not present

## 2018-05-28 DIAGNOSIS — G63 Polyneuropathy in diseases classified elsewhere: Secondary | ICD-10-CM | POA: Diagnosis not present

## 2018-05-28 DIAGNOSIS — N183 Chronic kidney disease, stage 3 unspecified: Secondary | ICD-10-CM

## 2018-05-28 DIAGNOSIS — E785 Hyperlipidemia, unspecified: Secondary | ICD-10-CM | POA: Diagnosis not present

## 2018-05-28 DIAGNOSIS — F1021 Alcohol dependence, in remission: Secondary | ICD-10-CM

## 2018-05-28 LAB — COMPLETE METABOLIC PANEL WITH GFR
AG Ratio: 1.5 (calc) (ref 1.0–2.5)
ALBUMIN MSPROF: 4 g/dL (ref 3.6–5.1)
ALT: 13 U/L (ref 9–46)
AST: 19 U/L (ref 10–35)
Alkaline phosphatase (APISO): 49 U/L (ref 35–144)
BILIRUBIN TOTAL: 1.1 mg/dL (ref 0.2–1.2)
BUN / CREAT RATIO: 11 (calc) (ref 6–22)
BUN: 16 mg/dL (ref 7–25)
CHLORIDE: 105 mmol/L (ref 98–110)
CO2: 31 mmol/L (ref 20–32)
CREATININE: 1.46 mg/dL — AB (ref 0.70–1.18)
Calcium: 9.5 mg/dL (ref 8.6–10.3)
GFR, Est African American: 55 mL/min/{1.73_m2} — ABNORMAL LOW (ref >=60)
GFR, Est Non African American: 48 mL/min/{1.73_m2} — ABNORMAL LOW (ref >=60)
GLUCOSE: 89 mg/dL (ref 65–139)
Globulin: 2.7 g/dL (calc) (ref 1.9–3.7)
Potassium: 4 mmol/L (ref 3.5–5.3)
Sodium: 140 mmol/L (ref 135–146)
Total Protein: 6.7 g/dL (ref 6.1–8.1)

## 2018-05-28 LAB — CBC WITH DIFFERENTIAL/PLATELET
Absolute Monocytes: 549 cells/uL (ref 200–950)
BASOS PCT: 0.4 %
Basophils Absolute: 27 cells/uL (ref 0–200)
EOS PCT: 0.9 %
Eosinophils Absolute: 60 cells/uL (ref 15–500)
HEMATOCRIT: 43.3 % (ref 38.5–50.0)
Hemoglobin: 14.7 g/dL (ref 13.2–17.1)
LYMPHS ABS: 1534 {cells}/uL (ref 850–3900)
MCH: 31.4 pg (ref 27.0–33.0)
MCHC: 33.9 g/dL (ref 32.0–36.0)
MCV: 92.5 fL (ref 80.0–100.0)
MPV: 12.7 fL — AB (ref 7.5–12.5)
Monocytes Relative: 8.2 %
NEUTROS PCT: 67.6 %
Neutro Abs: 4529 cells/uL (ref 1500–7800)
PLATELETS: 125 10*3/uL — AB (ref 140–400)
RBC: 4.68 10*6/uL (ref 4.20–5.80)
RDW: 12.4 % (ref 11.0–15.0)
Total Lymphocyte: 22.9 %
WBC: 6.7 10*3/uL (ref 3.8–10.8)

## 2018-05-28 LAB — LIPID PANEL
Cholesterol: 96 mg/dL (ref <200)
HDL: 45 mg/dL (ref >=40)
LDL CHOLESTEROL (CALC): 34 mg/dL
Non-HDL Cholesterol (Calc): 51 mg/dL (calc) (ref <130)
TRIGLYCERIDES: 85 mg/dL (ref <150)
Total CHOL/HDL Ratio: 2.1 (calc) (ref <5.0)

## 2018-05-28 MED ORDER — ATORVASTATIN CALCIUM 40 MG PO TABS: 40.0000 mg | ORAL_TABLET | Freq: Every day | ORAL | 1 refills | Status: DC

## 2018-05-28 MED ORDER — METOPROLOL SUCCINATE ER 25 MG PO TB24: 12.5000 mg | ORAL_TABLET | Freq: Every day | ORAL | 1 refills | Status: DC

## 2018-05-28 MED ORDER — NITROGLYCERIN 0.4 MG SL SUBL: 0.4000 mg | SUBLINGUAL_TABLET | SUBLINGUAL | 0 refills | Status: DC | PRN

## 2018-05-28 MED ORDER — UMECLIDINIUM-VILANTEROL 62.5-25 MCG/INH IN AEPB: 1.0000 | INHALATION_SPRAY | Freq: Every day | RESPIRATORY_TRACT | 3 refills | Status: DC

## 2018-05-28 MED ORDER — GABAPENTIN 300 MG PO CAPS: 300.0000 mg | ORAL_CAPSULE | Freq: Two times a day (BID) | ORAL | 1 refills | Status: DC

## 2018-05-28 NOTE — Progress Notes (Signed)
Name: LEXUS BARLETTA   MRN: [id number]    DOB: [birth year]   Date:05/28/2018       Progress Note  Subjective  Chief Complaint  Chief Complaint  Patient presents with  . Medication Refill  . Hypertension  . COPD  . Gastroesophageal Reflux  . Nicotine Dependence  . CKI Stage III    HPI  COPD Mild: he is takingAnoro,he denies any cough, wheezing , he is not very active, but states had to visit brother at [facility] and was walking without SOB.He still smokes 1.5 packs daily, CT chest showed emphysema. He is doing well on medication.   History of Alcoholism: he used to drink before retirement - about 2 shots at night, however when he retired at age 42 he started to drink about 1/5 of liquor every two days. He states he stopped drinking after two years because he fell on his face, developed a gastric ulcer and B12 deficiency. He is doing well since he stopped drinkingaround 2010.He still has burning on his legs from neuropathy, taking gabapentin, symptoms not as intense and not as constant, stable with medication and needs refills today   Low BP : taking medication, gets dizzy only when he gets up quickly in the mornings, we will not stop metoprolol because of heart protection, he will get slowly to avoid falls, bp towards low end of normal  GERD and history of gastric ulcer: He states symptoms controlled as long as he takes a PPI, no heartburn or regurgitation. He was seen by Dr. Fleet Contras and had EGD in August 2019 , large diverticulum but no signs of bleeding and has hiatal hernia small in size . He eats spicy food and citric fruit and has to take Tums occasionally, less than once a week.   Hyperlipidemia: taking Atorvastatin and denies side effects. Taking aspirin daily . Denies myalgia.He is due for repeat labs   CAD: found on CT chest 3 vessel disease he denies chest pain ,it may be multifactorial from COPD also. He was seen by Dr. Fletcher Anon and advised to quit smoking. On aspirin, statins  and beta-blocker. Cardiac cath in 2017 showed mild to moderate non-obstructive disease and is only on medical management. He saw Dr. Fletcher Anon in [id number],FU needs a refill of NTG, he states his chest pain is intermittent less than once a month.   B12 neuropathy: he is taking B12 supplementation and also takes Neurontin, he states tingling has improved but still present. Tips of fingers and also both legs and feet.   Tobacco Abuse: he has been a smoker for 43 years, he is up to date with triple A screening, negative in 2016, chest CT done in 11/2013 and lat one 06/2017 . Explained importance of quitting smoking again. He is on statin therapy and baby aspirin  Atherosclerosis abdominal aorta: on medical management, aspirin, beta-blocker and statin therapy. He is not ready to quit smoking yet   CKI: stage III, he avoiding NSAID's, no pruritis, good urine output , we will recheck labs      Patient Active Problem List   Diagnosis Date Noted  . Hiatal hernia 01/22/2018  . Abdominal pain, chronic, right lower quadrant 10/26/2017  . Esophageal diverticulum 10/26/2017  . History of alcoholism ([place]) 01/18/2016  . Gynecomastia 10/14/2015  . Abnormal nuclear stress test   . 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 Atherosclerosis of aorta ([place]) 04/15/2015  . BPH with obstruction/lower urinary tract symptoms 02/03/2015  . Renal cyst, left 10/13/2014  . Tobacco  use   . B12 neuropathy ([place]) 10/04/2014  . CAD, multiple vessel 10/04/2014  . Benign essential HTN 10/04/2014  . Chronic kidney disease (CKD), stage III (moderate) ([place]) 10/04/2014  . Dyslipidemia 10/04/2014  . COPD, mild ([place]) 10/04/2014  . Gastro-esophageal reflux disease without esophagitis 10/04/2014  . Enlarged prostate 10/04/2014  . Vitamin D deficiency 10/04/2014    Past Surgical History:  Procedure Laterality Date  . CARDIAC CATHETERIZATION N/A 07/02/2015   Procedure: Left Heart Cath and Coronary Angiography;  Surgeon: Wellington Hampshire, MD;  Location: [facility];  Service: Cardiovascular;  Laterality: N/A;  . COLONOSCOPY  2011   J[facility]  . ESOPHAGOGASTRODUODENOSCOPY (EGD) WITH PROPOFOL N/A 11/14/2017   Procedure: ESOPHAGOGASTRODUODENOSCOPY (EGD) WITH PROPOFOL;  Surgeon: Robert Bellow, MD;  Location: [facility];  Service: Endoscopy;  Laterality: N/A;  . throat biopsy     lump removed    Family History  Problem Relation Age of Onset  . Hypertension Mother   . CVA Mother   . Cancer Father        Lung  . Mental illness Brother        1-Schizophrenia  . GER disease Son   . Healthy Sister   . Heart Problems Brother   . Gallbladder disease Brother   . Healthy Sister   . Gallbladder disease Brother   . Hypertension Brother     Social History   Socioeconomic History  . Marital status: Married    Spouse name: Claudine  . Number of children: 3  . Years of education: some college  . Highest education level: 12th grade  Occupational History  . Occupation: Retired  Scientific laboratory technician  . Financial resource strain: Not hard at all  . Food insecurity:    Worry: Never true    Inability: Never true  . Transportation needs:    Medical: No    Non-medical: No  Tobacco Use  . Smoking status: Current Every Day Smoker    Packs/day: 1.00    Years: 41.00    Pack years: 41.00    Types: Cigarettes    Start date: 10/04/1973  . Smokeless tobacco: Never Used  Substance and Sexual Activity  . Alcohol use: No    Alcohol/week: 0.0 standard drinks  . Drug use: No  . Sexual activity: Not Currently    Partners: Female  Lifestyle  . Physical activity:    Days per week: 0 days    Minutes per session: 0 min  . Stress: Not at all  Relationships  . Social connections:    Talks on phone: Twice a week    Gets together: More than three times a week    Attends religious service: Never    Active member of club or organization: No    Attends meetings of clubs or organizations: Never    Relationship status: Married   . Intimate partner violence:    Fear of current or ex partner: No    Emotionally abused: No    Physically abused: No    Forced sexual activity: No  Other Topics Concern  . Not on file  Social History Narrative  . Not on file     Current Outpatient Medications:  .  aspirin 81 MG tablet, Take 1 tablet by mouth daily., Disp: , Rfl:  .  atorvastatin (LIPITOR) 40 MG tablet, Take 1 tablet (40 mg total) by mouth daily., Disp: 90 tablet, Rfl: 1 .  Cyanocobalamin (B-12) 1000 MCG TBCR, Take 1 tablet by  mouth daily., Disp: , Rfl:  .  gabapentin (NEURONTIN) 300 MG capsule, Take 1 capsule (300 mg total) by mouth 2 (two) times daily., Disp: 180 capsule, Rfl: 1 .  metoprolol succinate (TOPROL-XL) 25 MG 24 hr tablet, Take 0.5 tablets (12.5 mg total) by mouth daily., Disp: 45 tablet, Rfl: 1 .  MULTIPLE VITAMIN PO, Take 1 tablet by mouth daily., Disp: , Rfl:  .  nitroGLYCERIN (NITROSTAT) 0.4 MG SL tablet, Place 1 tablet (0.4 mg total) under the tongue every 5 (five) minutes as needed for chest pain., Disp: 30 tablet, Rfl: 0 .  pantoprazole (PROTONIX) 40 MG tablet, Take 1 tablet (40 mg total) by mouth daily., Disp: 90 tablet, Rfl: 1 .  tamsulosin (FLOMAX) 0.4 MG CAPS capsule, Take 1 capsule (0.4 mg total) by mouth daily., Disp: 90 capsule, Rfl: 3 .  umeclidinium-vilanterol (ANORO ELLIPTA) 62.5-25 MCG/INH AEPB, Inhale 1 puff into the lungs daily., Disp: 60 each, Rfl: 3 .  Vitamin D, Cholecalciferol, 1000 UNITS CAPS, Take 1 tablet by mouth daily., Disp: , Rfl:   No Known Allergies  I personally reviewed active problem list, medication list, allergies, family history, social history with the patient/caregiver today.   ROS  Constitutional: Negative for fever or weight change.  Respiratory: Negative for cough and shortness of breath.   Cardiovascular: Negative for chest pain or palpitations.  Gastrointestinal: Negative for abdominal pain, no bowel changes.  Musculoskeletal: Negative for gait problem or  joint swelling.  Skin: Negative for rash.  Neurological: Negative for dizziness or headache.  No other specific complaints in a complete review of systems (except as listed in HPI above).  Objective  Vitals:   05/28/18 0859  BP: 116/62  Pulse: 70  Resp: 16  Temp: 98 F (36.7 C)  TempSrc: Oral  SpO2: 98%  Weight: 158 lb 11.2 oz (72 kg)  Height: 5\' 11"  (1.803 m)    Body mass index is 22.13 kg/m.  Physical Exam  Constitutional: Patient appears well-developed and well-nourished.  No distress.  HEENT: head atraumatic, normocephalic, pupils equal and reactive to light, neck supple, throat within normal limits Cardiovascular: Normal rate, regular rhythm and normal heart sounds.  No murmur heard. No BLE edema. Pulmonary/Chest: Effort normal and breath sounds normal. No respiratory distress. Abdominal: Soft.  There is no tenderness. Psychiatric: Patient has a normal mood and affect. behavior is normal. Judgment and thought content normal.  PHQ2/9: Depression screen [facility] 2/9 05/28/2018 01/22/2018 10/04/2017 09/20/2017 05/25/2017  Decreased Interest 0 0 0 0 0  Down, Depressed, Hopeless 0 0 0 0 0  PHQ - 2 Score 0 0 0 0 0  Altered sleeping 0 - 0 - -  Tired, decreased energy 0 - 0 - -  Change in appetite 0 - 0 - -  Feeling bad or failure about yourself  0 - 0 - -  Trouble concentrating 0 - 0 - -  Moving slowly or fidgety/restless 0 - 0 - -  Suicidal thoughts 0 - 0 - -  PHQ-9 Score 0 - 0 - -  Difficult doing work/chores Not difficult at all - Not difficult at all - -     Fall Risk: Fall Risk  05/28/2018 01/22/2018 10/04/2017 09/20/2017 05/25/2017  Falls in the past year? 0 No No No No  Risk for fall due to : - - Impaired vision - -  Risk for fall due to: Comment - - wears eyeglasses - -     Functional Status Survey: Is the patient deaf or  have difficulty hearing?: No Does the patient have difficulty seeing, even when wearing glasses/contacts?: Yes Does the patient have difficulty  concentrating, remembering, or making decisions?: No Does the patient have difficulty walking or climbing stairs?: No Does the patient have difficulty dressing or bathing?: No Does the patient have difficulty doing errands alone such as visiting a doctor's office or shopping?: No    Assessment & Plan  1. Dyslipidemia  - atorvastatin (LIPITOR) 40 MG tablet; Take 1 tablet (40 mg total) by mouth daily.  Dispense: 90 tablet; Refill: 1  2. B12 neuropathy (HCC)  - gabapentin (NEURONTIN) 300 MG capsule; Take 1 capsule (300 mg total) by mouth 2 (two) times daily.  Dispense: 180 capsule; Refill: 1  3. Atherosclerosis of aorta (HCC)  - nitroGLYCERIN (NITROSTAT) 0.4 MG SL tablet; Place 1 tablet (0.4 mg total) under the tongue every 5 (five) minutes as needed for chest pain.  Dispense: 30 tablet; Refill: 0  4. Anginal equivalent (HCC)  - metoprolol succinate (TOPROL-XL) 25 MG 24 hr tablet; Take 0.5 tablets (12.5 mg total) by mouth daily.  Dispense: 45 tablet; Refill: 1 - nitroGLYCERIN (NITROSTAT) 0.4 MG SL tablet; Place 1 tablet (0.4 mg total) under the tongue every 5 (five) minutes as needed for chest pain.  Dispense: 30 tablet; Refill: 0  5. COPD, moderate ([place])  - umeclidinium-vilanterol (ANORO ELLIPTA) 62.5-25 MCG/INH AEPB; Inhale 1 puff into the lungs daily.  Dispense: 60 each; Refill: 3  6. History of alcoholism ([place])  In remission  7. Chronic kidney disease (CKD), stage III (moderate) (HCC)  - COMPLETE METABOLIC PANEL WITH GFR - CBC with Differential/Platelet

## 2018-05-29 ENCOUNTER — Other Ambulatory Visit: Payer: Self-pay | Admitting: Family Medicine

## 2018-05-29 DIAGNOSIS — D696 Thrombocytopenia, unspecified: Secondary | ICD-10-CM

## 2018-06-08 ENCOUNTER — Telehealth: Payer: Self-pay

## 2018-06-08 NOTE — Telephone Encounter (Signed)
Call pt regarding lung screening. Left message to return call. 

## 2018-06-12 ENCOUNTER — Telehealth: Payer: Self-pay | Admitting: *Deleted

## 2018-06-12 NOTE — Telephone Encounter (Signed)
Left message for patient to notify them that it is time to schedule annual low dose lung cancer screening CT scan. Instructed patient to call back to verify information prior to the scan being scheduled.  

## 2018-06-14 ENCOUNTER — Encounter: Payer: Self-pay | Admitting: *Deleted

## 2018-06-22 ENCOUNTER — Telehealth: Payer: Self-pay

## 2018-06-22 NOTE — Telephone Encounter (Signed)
Call pt regarding lung screening. Left message for pt to return call.  

## 2018-06-25 ENCOUNTER — Telehealth: Payer: Self-pay | Admitting: *Deleted

## 2018-06-25 DIAGNOSIS — Z122 Encounter for screening for malignant neoplasm of respiratory organs: Secondary | ICD-10-CM

## 2018-06-25 DIAGNOSIS — Z87891 Personal history of nicotine dependence: Secondary | ICD-10-CM

## 2018-06-25 NOTE — Telephone Encounter (Signed)
Patient has been notified that annual lung cancer screening low dose CT scan is due currently or will be in near future. Confirmed that patient is within the age range of 55-77, and asymptomatic, (no signs or symptoms of lung cancer). Patient denies illness that would prevent curative treatment for lung cancer if found. Verified smoking history, (current, 36 pack year). The shared decision making visit was done 11/21/13. Patient is agreeable for CT scan being scheduled.  

## 2018-06-26 ENCOUNTER — Other Ambulatory Visit: Payer: Self-pay

## 2018-06-26 DIAGNOSIS — D696 Thrombocytopenia, unspecified: Secondary | ICD-10-CM

## 2018-06-26 LAB — CBC
HCT: 43.4 % (ref 38.5–50.0)
Hemoglobin: 14.8 g/dL (ref 13.2–17.1)
MCH: 31.4 pg (ref 27.0–33.0)
MCHC: 34.1 g/dL (ref 32.0–36.0)
MCV: 92.1 fL (ref 80.0–100.0)
MPV: 12.9 fL — ABNORMAL HIGH (ref 7.5–12.5)
Platelets: 150 10*3/uL (ref 140–400)
RBC: 4.71 10*6/uL (ref 4.20–5.80)
RDW: 12.1 % (ref 11.0–15.0)
WBC: 8.2 10*3/uL (ref 3.8–10.8)

## 2018-06-27 ENCOUNTER — Telehealth: Payer: Self-pay | Admitting: *Deleted

## 2018-06-27 NOTE — Telephone Encounter (Signed)
Patient notified/message left to notify patient that due to current restrictions lung screening appointments are cancelled and patient will be contacted regarding rescheduling.  

## 2018-07-01 ENCOUNTER — Ambulatory Visit: Payer: Medicare Other

## 2018-08-19 ENCOUNTER — Telehealth: Payer: Self-pay | Admitting: *Deleted

## 2018-08-19 ENCOUNTER — Other Ambulatory Visit: Payer: Self-pay

## 2018-08-19 ENCOUNTER — Ambulatory Visit
Admission: RE | Admit: 2018-08-19 | Discharge: 2018-08-19 | Disposition: A | Discharge status: Home / Self Care | Payer: Medicare Other | Source: Ambulatory Visit | Attending: Oncology | Admitting: Oncology

## 2018-08-19 DIAGNOSIS — Z87891 Personal history of nicotine dependence: Secondary | ICD-10-CM | POA: Insufficient documentation

## 2018-08-19 DIAGNOSIS — Z122 Encounter for screening for malignant neoplasm of respiratory organs: Secondary | ICD-10-CM | POA: Diagnosis not present

## 2018-08-19 DIAGNOSIS — F17210 Nicotine dependence, cigarettes, uncomplicated: Secondary | ICD-10-CM | POA: Diagnosis not present

## 2018-08-19 NOTE — Telephone Encounter (Signed)
Patient has been notified that annual lung cancer screening low dose CT scan is due currently or will be in near future. Confirmed that patient is within the age range of 55-77, and asymptomatic, (no signs or symptoms of lung cancer). Patient denies illness that would prevent curative treatment for lung cancer if found. Verified smoking history, (current, 36 pack year). The shared decision making visit was done 11/21/13. Patient is agreeable for CT scan being scheduled.

## 2018-08-21 ENCOUNTER — Encounter: Payer: Self-pay | Admitting: *Deleted

## 2018-09-09 ENCOUNTER — Other Ambulatory Visit: Payer: Self-pay | Admitting: Family Medicine

## 2018-09-09 DIAGNOSIS — Z8711 Personal history of peptic ulcer disease: Secondary | ICD-10-CM

## 2018-09-09 DIAGNOSIS — Z8719 Personal history of other diseases of the digestive system: Secondary | ICD-10-CM

## 2018-09-09 DIAGNOSIS — K219 Gastro-esophageal reflux disease without esophagitis: Secondary | ICD-10-CM

## 2018-10-10 ENCOUNTER — Ambulatory Visit (INDEPENDENT_AMBULATORY_CARE_PROVIDER_SITE_OTHER): Payer: Medicare Other | Admitting: Family Medicine

## 2018-10-10 ENCOUNTER — Ambulatory Visit (INDEPENDENT_AMBULATORY_CARE_PROVIDER_SITE_OTHER): Payer: Medicare Other

## 2018-10-10 ENCOUNTER — Encounter: Payer: Self-pay | Admitting: Family Medicine

## 2018-10-10 ENCOUNTER — Other Ambulatory Visit: Payer: Self-pay

## 2018-10-10 VITALS — BP 108/58 | HR 72 | Temp 98.4°F | Resp 16 | Ht 71.0 in | Wt 155.1 lb

## 2018-10-10 DIAGNOSIS — N183 Chronic kidney disease, stage 3 unspecified: Secondary | ICD-10-CM

## 2018-10-10 DIAGNOSIS — Z0000 Encounter for general adult medical examination without abnormal findings: Secondary | ICD-10-CM

## 2018-10-10 DIAGNOSIS — I129 Hypertensive chronic kidney disease with stage 1 through stage 4 chronic kidney disease, or unspecified chronic kidney disease: Secondary | ICD-10-CM | POA: Diagnosis not present

## 2018-10-10 DIAGNOSIS — I208 Other forms of angina pectoris: Secondary | ICD-10-CM | POA: Diagnosis not present

## 2018-10-10 DIAGNOSIS — G63 Polyneuropathy in diseases classified elsewhere: Secondary | ICD-10-CM

## 2018-10-10 DIAGNOSIS — J449 Chronic obstructive pulmonary disease, unspecified: Secondary | ICD-10-CM

## 2018-10-10 DIAGNOSIS — D696 Thrombocytopenia, unspecified: Secondary | ICD-10-CM

## 2018-10-10 DIAGNOSIS — I700 Atherosclerosis of aorta: Secondary | ICD-10-CM

## 2018-10-10 DIAGNOSIS — E538 Deficiency of other specified B group vitamins: Secondary | ICD-10-CM | POA: Diagnosis not present

## 2018-10-10 DIAGNOSIS — E785 Hyperlipidemia, unspecified: Secondary | ICD-10-CM

## 2018-10-10 DIAGNOSIS — F1021 Alcohol dependence, in remission: Secondary | ICD-10-CM | POA: Diagnosis not present

## 2018-10-10 DIAGNOSIS — I10 Essential (primary) hypertension: Secondary | ICD-10-CM

## 2018-10-10 MED ORDER — GABAPENTIN 300 MG PO CAPS: 300.0000 mg | ORAL_CAPSULE | Freq: Two times a day (BID) | ORAL | 1 refills | Status: DC

## 2018-10-10 NOTE — Patient Instructions (Signed)
Please contact insurance to find out if they will cover tetanus booster, ask if Td or Tdap and if you can get it here

## 2018-10-10 NOTE — Patient Instructions (Signed)
Kevin Roberts , Thank you for taking time to come for your Medicare Wellness Visit. I appreciate your ongoing commitment to your health goals. Please review the following plan we discussed and let me know if I can assist you in the future.   Screening recommendations/referrals: Colonoscopy: done 04/23/09. Repeat 2021. Recommended yearly ophthalmology/optometry visit for glaucoma screening and checkup Recommended yearly dental visit for hygiene and checkup  Vaccinations: Influenza vaccine: done 01/22/18 Pneumococcal vaccine: done 10/14/15 Tdap vaccine: done 11/10/08 Shingles vaccine: Shingrix discussed. Please contact your pharmacy for coverage information.   Advanced directives: Advance directive discussed with you today. Even though you declined this today please call our office should you change your mind and we can give you the proper paperwork for you to fill out.  Conditions/risks identified: If you wish to quit smoking, help is available. For free tobacco cessation program offerings call the [facility] at [phone] or Live Well Line at (380) 089-9406. You may also visit [email or web address] or email [email or web address] for more information on other programs.   Next appointment: Please follow up in one year for your Medicare Annual Wellness visit.    Preventive Care 65 Years and Older, Male Preventive care refers to lifestyle choices and visits with your health care provider that can promote health and wellness. What does preventive care include?  A yearly physical exam. This is also called an annual well check.  Dental exams once or twice a year.  Routine eye exams. Ask your health care provider how often you should have your eyes checked.  Personal lifestyle choices, including:  Daily care of your teeth and gums.  Regular physical activity.  Eating a healthy diet.  Avoiding tobacco and drug use.  Limiting alcohol use.  Practicing safe sex.  Taking  low doses of aspirin every day.  Taking vitamin and mineral supplements as recommended by your health care provider. What happens during an annual well check? The services and screenings done by your health care provider during your annual well check will depend on your age, overall health, lifestyle risk factors, and family history of disease. Counseling  Your health care provider may ask you questions about your:  Alcohol use.  Tobacco use.  Drug use.  Emotional well-being.  Home and relationship well-being.  Sexual activity.  Eating habits.  History of falls.  Memory and ability to understand (cognition).  Work and work Statistician. Screening  You may have the following tests or measurements:  Height, weight, and BMI.  Blood pressure.  Lipid and cholesterol levels. These may be checked every 5 years, or more frequently if you are over 44 years old.  Skin check.  Lung cancer screening. You may have this screening every year starting at age 59 if you have a 30-pack-year history of smoking and currently smoke or have quit within the past 15 years.  Fecal occult blood test (FOBT) of the stool. You may have this test every year starting at age 53.  Flexible sigmoidoscopy or colonoscopy. You may have a sigmoidoscopy every 5 years or a colonoscopy every 10 years starting at age 89.  Prostate cancer screening. Recommendations will vary depending on your family history and other risks.  Hepatitis C blood test.  Hepatitis B blood test.  Sexually transmitted disease (STD) testing.  Diabetes screening. This is done by checking your blood sugar (glucose) after you have not eaten for a while (fasting). You may have this done every 1-3 years.  Abdominal aortic aneurysm (AAA) screening.  You may need this if you are a current or former smoker.  Osteoporosis. You may be screened starting at age 74 if you are at high risk. Talk with your health care provider about your test  results, treatment options, and if necessary, the need for more tests. Vaccines  Your health care provider may recommend certain vaccines, such as:  Influenza vaccine. This is recommended every year.  Tetanus, diphtheria, and acellular pertussis (Tdap, Td) vaccine. You may need a Td booster every 10 years.  Zoster vaccine. You may need this after age 32.  Pneumococcal 13-valent conjugate (PCV13) vaccine. One dose is recommended after age 31.  Pneumococcal polysaccharide (PPSV23) vaccine. One dose is recommended after age 69. Talk to your health care provider about which screenings and vaccines you need and how often you need them. This information is not intended to replace advice given to you by your health care provider. Make sure you discuss any questions you have with your health care provider. Document Released: 04/23/2015 Document Revised: 12/15/2015 Document Reviewed: 01/26/2015 Elsevier Interactive Patient Education  2017 [place] Prevention in the Home Falls can cause injuries. They can happen to people of all ages. There are many things you can do to make your home safe and to help prevent falls. What can I do on the outside of my home?  Regularly fix the edges of walkways and driveways and fix any cracks.  Remove anything that might make you trip as you walk through a door, such as a raised step or threshold.  Trim any bushes or trees on the path to your home.  Use bright outdoor lighting.  Clear any walking paths of anything that might make someone trip, such as rocks or tools.  Regularly check to see if handrails are loose or broken. Make sure that both sides of any steps have handrails.  Any raised decks and porches should have guardrails on the edges.  Have any leaves, snow, or ice cleared regularly.  Use sand or salt on walking paths during winter.  Clean up any spills in your garage right away. This includes oil or grease spills. What can I do in  the bathroom?  Use night lights.  Install grab bars by the toilet and in the tub and shower. Do not use towel bars as grab bars.  Use non-skid mats or decals in the tub or shower.  If you need to sit down in the shower, use a plastic, non-slip stool.  Keep the floor dry. Clean up any water that spills on the floor as soon as it happens.  Remove soap buildup in the tub or shower regularly.  Attach bath mats securely with double-sided non-slip rug tape.  Do not have throw rugs and other things on the floor that can make you trip. What can I do in the bedroom?  Use night lights.  Make sure that you have a light by your bed that is easy to reach.  Do not use any sheets or blankets that are too big for your bed. They should not hang down onto the floor.  Have a firm chair that has side arms. You can use this for support while you get dressed.  Do not have throw rugs and other things on the floor that can make you trip. What can I do in the kitchen?  Clean up any spills right away.  Avoid walking on wet floors.  Keep items that you use a lot in easy-to-reach places.  If you need to reach something above you, use a strong step stool that has a grab bar.  Keep electrical cords out of the way.  Do not use floor polish or wax that makes floors slippery. If you must use wax, use non-skid floor wax.  Do not have throw rugs and other things on the floor that can make you trip. What can I do with my stairs?  Do not leave any items on the stairs.  Make sure that there are handrails on both sides of the stairs and use them. Fix handrails that are broken or loose. Make sure that handrails are as long as the stairways.  Check any carpeting to make sure that it is firmly attached to the stairs. Fix any carpet that is loose or worn.  Avoid having throw rugs at the top or bottom of the stairs. If you do have throw rugs, attach them to the floor with carpet tape.  Make sure that you  have a light switch at the top of the stairs and the bottom of the stairs. If you do not have them, ask someone to add them for you. What else can I do to help prevent falls?  Wear shoes that:  Do not have high heels.  Have rubber bottoms.  Are comfortable and fit you well.  Are closed at the toe. Do not wear sandals.  If you use a stepladder:  Make sure that it is fully opened. Do not climb a closed stepladder.  Make sure that both sides of the stepladder are locked into place.  Ask someone to hold it for you, if possible.  Clearly mark and make sure that you can see:  Any grab bars or handrails.  First and last steps.  Where the edge of each step is.  Use tools that help you move around (mobility aids) if they are needed. These include:  Canes.  Walkers.  Scooters.  Crutches.  Turn on the lights when you go into a dark area. Replace any light bulbs as soon as they burn out.  Set up your furniture so you have a clear path. Avoid moving your furniture around.  If any of your floors are uneven, fix them.  If there are any pets around you, be aware of where they are.  Review your medicines with your doctor. Some medicines can make you feel dizzy. This can increase your chance of falling. Ask your doctor what other things that you can do to help prevent falls. This information is not intended to replace advice given to you by your health care provider. Make sure you discuss any questions you have with your health care provider. Document Released: 01/21/2009 Document Revised: 09/02/2015 Document Reviewed: 05/01/2014 Elsevier Interactive Patient Education  2017 [employer].

## 2018-10-10 NOTE — Progress Notes (Signed)
Name: SELIG Roberts   MRN: [id number]    DOB: Nov 15, 1945   Date:10/10/2018       Progress Note  Subjective  Chief Complaint  Chief Complaint  Patient presents with  . Follow-up    to medicare wellness    HPI  COPD Moderate : he is takingAnoro,he denies any cough, wheezing , he is not very active, states SOB only with moderate activity. He still smoking more up to 2  packs daily, CT chest showed emphysema. He is doing well on medication.  History of Alcoholism: he used to drink before retirement - about 2 shots at night, however when he retired at age 66 he started to drink about 1/5 of liquor every two days. He states he stopped drinking  because he fell on his face, developed a gastric ulcer and B12 deficiency. He is doing well since he stopped drinkingaround 2010.He still has burning on his legs from neuropathy, taking gabapentin, symptoms not as intense and not as constant, stable with medication and needs refills today   Low BP : taking medication, gets dizzy only when he gets up quickly in the mornings, since bp is even lower with half dose of metoprolol we will stop metoprolol and monitor   GERD and history of gastric ulcer: He states symptoms controlled as long as he takes a PPI, no heartburn or regurgitation. He was seen by Dr. Fleet Roberts and had EGD in August 2019 , large diverticulum but no signs of bleeding and has hiatal hernia small in size. He eats spicy food and citric fruit and has to take Tums occasionally, less than once a week.   Hyperlipidemia: taking Atorvastatin and denies side effects. Taking aspirin daily . Denies myalgia. Reviewed labs with patient today   CAD: found on CT chest 3 vessel disease he denies chest pain ,it may be multifactorial from COPD also. He was seen by Dr. Fletcher Roberts and advised to quit smoking. On aspirin, statins and beta-blocker. Cardiac cath in 2017 showed mild to moderate non-obstructive disease and is only on medical management. He saw  Dr. [id number] needs a refill of NTG, he states his chest pain is intermittent, not sure how often the symptoms occurs but not weekly, usually worse at night. He states improves with Tums at times, it may not be cardiac , advised to take NTG and see if symptoms resolves   B12 neuropathy: he is taking B12 supplementation and also takes Neurontin, he states tingling has improved but still present. Tips of fingers and also both legs and feet. , he needs refill of Neurontin   Tobacco Abuse: he has been a smoker for 43years, he is up to date with triple A screening, negative in 2016, chest CT done in 11/2013 and lat one 06/2017. Explained importance of quitting smoking again. He is on statin therapy and baby aspirin, he has been smoking a little more lately, states he does not have anything else to do   Atherosclerosis abdominal aorta: on medical management, aspirin, beta-blocker and statin therapy. He is not ready to quit smoking yet We will stop beta-blocker because bp is low   CKI: stage III, he avoiding NSAID's, no pruritis, good urine output , recheck labs     Patient Active Problem List   Diagnosis Date Noted  . Hiatal hernia 01/22/2018  . Abdominal pain, chronic, right lower quadrant 10/26/2017  . Esophageal diverticulum 10/26/2017  . History of alcoholism ([facility]) 01/18/2016  . Gynecomastia 10/14/2015  . Abnormal nuclear  stress test   . Anginal equivalent ([facility])   . Atherosclerosis of aorta ([facility]) 04/15/2015  . BPH with obstruction/lower urinary tract symptoms 02/03/2015  . Renal cyst, left 10/13/2014  . Tobacco use   . B12 neuropathy ([facility] Roberts) 10/04/2014  . CAD, multiple vessel 10/04/2014  . Benign essential HTN 10/04/2014  . Chronic kidney disease (CKD), stage III (moderate) ([place]) 10/04/2014  . Dyslipidemia 10/04/2014  . COPD, mild ([facility]) 10/04/2014  . Gastro-esophageal reflux disease without esophagitis 10/04/2014  . Enlarged prostate 10/04/2014  . Vitamin D deficiency  10/04/2014    Past Surgical History:  Procedure Laterality Date  . CARDIAC CATHETERIZATION N/A 07/02/2015   Procedure: Left Heart Cath and Coronary Angiography;  Surgeon: Kevin Hampshire, Kevin Roberts;  Location: [facility];  Service: Cardiovascular;  Laterality: N/A;  . COLONOSCOPY  2011   [facility]  . ESOPHAGOGASTRODUODENOSCOPY (EGD) WITH PROPOFOL N/A 11/14/2017   Procedure: ESOPHAGOGASTRODUODENOSCOPY (EGD) WITH PROPOFOL;  Surgeon: Kevin Bellow, Kevin Roberts;  Location: [facility];  Service: Roberts;  Laterality: N/A;  . throat biopsy     lump removed    Family History  Problem Relation Age of Onset  . Hypertension Mother   . CVA Mother   . Cancer Father        Lung  . Mental illness Brother        1-Schizophrenia  . GER disease Son   . Healthy Sister   . Heart Problems Brother   . Gallbladder disease Brother   . Healthy Sister   . Gallbladder disease Brother   . Hypertension Brother     Social History   Socioeconomic History  . Marital status: Married    Spouse name: Kevin Roberts  . Number of children: 3  . Years of education: some college  . Highest education level: 12th grade  Occupational History  . Occupation: Retired  Scientific laboratory technician  . Financial resource strain: Not hard at all  . Food insecurity    Worry: Never true    Inability: Never true  . Transportation needs    Medical: No    Non-medical: No  Tobacco Use  . Smoking status: Current Every Day Smoker    Packs/day: 2.00    Years: 41.00    Pack years: 82.00    Types: Cigarettes    Start date: 10/04/1973  . Smokeless tobacco: Never Used  Substance and Sexual Activity  . Alcohol use: No    Alcohol/week: 0.0 standard drinks  . Drug use: No  . Sexual activity: Not Currently    Partners: Female  Lifestyle  . Physical activity    Days per week: 0 days    Minutes per session: 0 min  . Stress: Not at all  Relationships  . Social Herbalist on phone: Twice a week    Gets together: More than three  times a week    Attends religious service: Never    Active member of club or organization: No    Attends meetings of clubs or organizations: Never    Relationship status: Married  . Intimate partner violence    Fear of current or ex partner: No    Emotionally abused: No    Physically abused: No    Forced sexual activity: No  Other Topics Concern  . Not on file  Social History Narrative  . Not on file     Current Outpatient Medications:  .  aspirin 81 MG tablet, Take 1 tablet by mouth daily., Disp: ,  Rfl:  .  atorvastatin (LIPITOR) 40 MG tablet, Take 1 tablet (40 mg total) by mouth daily., Disp: 90 tablet, Rfl: 1 .  Cyanocobalamin (B-12) 1000 MCG TBCR, Take 1 tablet by mouth daily., Disp: , Rfl:  .  gabapentin (NEURONTIN) 300 MG capsule, Take 1 capsule (300 mg total) by mouth 2 (two) times daily., Disp: 180 capsule, Rfl: 1 .  MULTIPLE VITAMIN PO, Take 1 tablet by mouth daily., Disp: , Rfl:  .  nitroGLYCERIN (NITROSTAT) 0.4 MG SL tablet, Place 1 tablet (0.4 mg total) under the tongue every 5 (five) minutes as needed for chest pain., Disp: 30 tablet, Rfl: 0 .  pantoprazole (PROTONIX) 40 MG tablet, TAKE 1 TABLET BY MOUTH DAILY, Disp: 90 tablet, Rfl: 1 .  tamsulosin (FLOMAX) 0.4 MG CAPS capsule, Take 1 capsule (0.4 mg total) by mouth daily., Disp: 90 capsule, Rfl: 3 .  umeclidinium-vilanterol (ANORO ELLIPTA) 62.5-25 MCG/INH AEPB, Inhale 1 puff into the lungs daily., Disp: 60 each, Rfl: 3 .  Vitamin D, Cholecalciferol, 1000 UNITS CAPS, Take 1 tablet by mouth daily., Disp: , Rfl:   No Known Allergies  I personally reviewed active problem list, medication list, allergies, family history, social history with the patient/caregiver today.   ROS  Constitutional: Negative for fever , positive for mild  weight change.  Respiratory: Negative for cough , positive for  shortness of breath with activity .   Cardiovascular: Negative for chest pain or palpitations.  Gastrointestinal: Negative for  abdominal pain, no bowel changes.  Musculoskeletal: Negative for gait problem or joint swelling.  Skin: Negative for rash.  Neurological: Negative for dizziness or headache.  No other specific complaints in a complete review of systems (except as listed in HPI above).  Objective  Vitals:   10/10/18 0911  BP: (!) 108/58  Pulse: 72  Resp: 16  Temp: 98.4 F (36.9 C)  TempSrc: Oral  SpO2: 96%  Weight: 155 lb 1.6 oz (70.4 kg)  Height: 5\' 11"  (1.803 m)    Body mass index is 21.63 kg/m.  Physical Exam  Constitutional: Patient appears well-developed and well-nourished. Always thin No distress.  HEENT: head atraumatic, normocephalic, pupils equal and reactive to light, neck supple Cardiovascular: Normal rate, regular rhythm and normal heart sounds.  No murmur heard. No BLE edema. Pulmonary/Chest: Effort normal and breath sounds normal. No respiratory distress. Abdominal: Soft.  There is no tenderness. Psychiatric: Patient has a normal mood and affect. behavior is normal. Judgment and thought content normal.   PHQ2/9: Depression screen [facility] 2/9 10/10/2018 05/28/2018 01/22/2018 10/04/2017 09/20/2017  Decreased Interest 0 0 0 0 0  Down, Depressed, Hopeless 0 0 0 0 0  PHQ - 2 Score 0 0 0 0 0  Altered sleeping 0 0 - 0 -  Tired, decreased energy 0 0 - 0 -  Change in appetite 0 0 - 0 -  Feeling bad or failure about yourself  0 0 - 0 -  Trouble concentrating 0 0 - 0 -  Moving slowly or fidgety/restless 0 0 - 0 -  Suicidal thoughts 0 0 - 0 -  PHQ-9 Score 0 0 - 0 -  Difficult doing work/chores Not difficult at all Not difficult at all - Not difficult at all -    phq 9 is negative   Fall Risk: Fall Risk  10/10/2018 05/28/2018 01/22/2018 10/04/2017 09/20/2017  Falls in the past year? 0 0 No No No  Number falls in past yr: 0 - - - -  Injury with  Fall? 0 - - - -  Risk for fall due to : - - - Impaired vision -  Risk for fall due to: Comment - - - wears eyeglasses -  Follow up Falls prevention  discussed - - - -      Assessment & Plan  1. B12 neuropathy (HCC)  - gabapentin (NEURONTIN) 300 MG capsule; Take 1 capsule (300 mg total) by mouth 2 (two) times daily.  Dispense: 180 capsule; Refill: 1  2. Thrombocytopenia ([place])  Recheck CBC today   3. Dyslipidemia  Reviewed last labs   4. History of alcoholism ([place] [facility])  Quit years ago in remission   5. Atherosclerosis of aorta ([place])  On statin therapy   6. COPD, moderate ([place])  Doing better on Anoro   7. Benign essential HTN  BP is low, stop metoprolol and recheck next visit   8. Chronic kidney disease (CKD), stage III (moderate) ([place])  Recheck labs   9. Benign hypertension with chronic kidney disease, stage III (HCC)  - CBC with Differential/Platelet - COMPLETE METABOLIC PANEL WITH GFR

## 2018-10-10 NOTE — Progress Notes (Signed)
Subjective:   Kevin Roberts is a 73 y.o. male who presents for Medicare Annual/Subsequent preventive examination.  Review of Systems:   Cardiac Risk Factors include: advanced age (>81men, >44 women);dyslipidemia;male gender;smoking/ tobacco exposure     Objective:    Vitals: BP (!) 108/58 (BP Location: Left Arm, Patient Position: Sitting, Cuff Size: Normal)    Pulse 72    Temp 98.4 F (36.9 C) (Oral)    Resp 16    Ht 5\' 11"  (1.803 m)    Wt 155 lb 1.6 oz (70.4 kg)    SpO2 96%    BMI 21.63 kg/m   Body mass index is 21.63 kg/m.  Advanced Directives 10/10/2018 11/14/2017 10/04/2017 09/20/2016 05/22/2016 01/18/2016 10/14/2015  Does Patient Have a Medical Advance Directive? No No No No No No No  Would patient like information on creating a medical advance directive? No - Patient declined No - Patient declined Yes (MAU/Ambulatory/Procedural Areas - Information given) - - No - patient declined information -    Tobacco Social History   Tobacco Use  Smoking Status Current Every Day Smoker   Packs/day: 2.00   Years: 41.00   Pack years: 82.00   Types: Cigarettes   Start date: 10/04/1973  Smokeless Tobacco Never Used     Ready to quit: Not Answered Counseling given: Not Answered   Clinical Intake:  Pre-visit preparation completed: Yes  Pain : No/denies pain     BMI - recorded: 21.63 Nutritional Status: BMI of 19-24  Normal Nutritional Risks: None Diabetes: No  How often do you need to have someone help you when you read instructions, pamphlets, or other written materials from your doctor or pharmacy?: 1 - Never  Interpreter Needed?: No  Information entered by :: Clemetine Marker LPN  Past Medical History:  Diagnosis Date   Benign prostatic hypertrophy    CAD (coronary atherosclerotic disease)    Chronic kidney disease, stage III (moderate) (HCC)    COPD (chronic obstructive pulmonary disease) (HCC)    Cramps, muscle, general    Elevated ferritin    Emphysema of lung  (HCC)    GERD (gastroesophageal reflux disease)    History of hypercalcemia    Hyperlipidemia    Hypertension    Incomplete bladder emptying    Nocturia    Peripheral polyneuropathy    Rising PSA level    Tobacco use    Urgency of micturation    Vitamin B 12 deficiency    Past Surgical History:  Procedure Laterality Date   CARDIAC CATHETERIZATION N/A 07/02/2015   Procedure: Left Heart Cath and Coronary Angiography;  Surgeon: Wellington Hampshire, MD;  Location: [place] [facility];  Service: Cardiovascular;  Laterality: N/A;   COLONOSCOPY  2011   [facility]   ESOPHAGOGASTRODUODENOSCOPY (EGD) WITH PROPOFOL N/A 11/14/2017   Procedure: ESOPHAGOGASTRODUODENOSCOPY (EGD) WITH PROPOFOL;  Surgeon: Robert Bellow, MD;  Location: [facility];  Service: Endoscopy;  Laterality: N/A;   throat biopsy     lump removed   Family History  Problem Relation Age of Onset   Hypertension Mother    CVA Mother    Cancer Father        Lung   Mental illness Brother        1-Schizophrenia   GER disease Son    Healthy Sister    Heart Problems Brother    Gallbladder disease Brother    Healthy Sister    Gallbladder disease Brother    Hypertension Brother    Social  History   Socioeconomic History   Marital status: Married    Spouse name: Claudine   Number of children: 3   Years of education: some college   Highest education level: 12th grade  Occupational History   Occupation: Retired  Scientist, product/process development strain: Not hard at [employer] insecurity    Worry: Never true    Inability: Never true   Transportation needs    Medical: No    Non-medical: No  Tobacco Use   Smoking status: Current Every Day Smoker    Packs/day: 2.00    Years: 41.00    Pack years: 82.00    Types: Cigarettes    Start date: 10/04/1973   Smokeless tobacco: Never Used  Substance and Sexual Activity   Alcohol use: No    Alcohol/week: 0.0 standard drinks   Drug use: No     Sexual activity: Not Currently    Partners: Female  Lifestyle   Physical activity    Days per week: 0 days    Minutes per session: 0 min   Stress: Not at all  Relationships   Social connections    Talks on phone: Twice a week    Gets together: More than three times a week    Attends religious service: Never    Active member of club or organization: No    Attends meetings of clubs or organizations: Never    Relationship status: Married  Other Topics Concern   Not on file  Social History Narrative   Not on file    Outpatient Encounter Medications as of 10/10/2018  Medication Sig   aspirin 81 MG tablet Take 1 tablet by mouth daily.   atorvastatin (LIPITOR) 40 MG tablet Take 1 tablet (40 mg total) by mouth daily.   Cyanocobalamin (B-12) 1000 MCG TBCR Take 1 tablet by mouth daily.   gabapentin (NEURONTIN) 300 MG capsule Take 1 capsule (300 mg total) by mouth 2 (two) times daily.   MULTIPLE VITAMIN PO Take 1 tablet by mouth daily.   nitroGLYCERIN (NITROSTAT) 0.4 MG SL tablet Place 1 tablet (0.4 mg total) under the tongue every 5 (five) minutes as needed for chest pain.   pantoprazole (PROTONIX) 40 MG tablet TAKE 1 TABLET BY MOUTH DAILY   tamsulosin (FLOMAX) 0.4 MG CAPS capsule Take 1 capsule (0.4 mg total) by mouth daily.   umeclidinium-vilanterol (ANORO ELLIPTA) 62.5-25 MCG/INH AEPB Inhale 1 puff into the lungs daily.   Vitamin D, Cholecalciferol, 1000 UNITS CAPS Take 1 tablet by mouth daily.   metoprolol succinate (TOPROL-XL) 25 MG 24 hr tablet Take 0.5 tablets (12.5 mg total) by mouth daily. (Patient not taking: Reported on 10/10/2018)   No facility-administered encounter medications on file as of 10/10/2018.     Activities of Daily Living In your present state of health, do you have any difficulty performing the following activities: 10/10/2018 05/28/2018  Hearing? N N  Comment declines hearing aids -  Vision? N Y  Comment wears glasses -  Difficulty concentrating  or making decisions? N N  Walking or climbing stairs? N N  Dressing or bathing? N N  Doing errands, shopping? N N  Preparing Food and eating ? N -  Using the Toilet? N -  In the past six months, have you accidently leaked urine? N -  Do you have problems with loss of bowel control? N -  Managing your Medications? N -  Managing your Finances? N -  Housekeeping or managing  your Housekeeping? N -  Some recent data might be hidden    Patient Care Team: Steele Sizer, MD as PCP - General (Family Medicine) Wellington Hampshire, MD as Consulting Physician (Cardiology) Bary Castilla, Forest Gleason, MD as Consulting Physician (General Surgery) Laneta Simmers as Physician Assistant (Urology)   Assessment:   This is a routine wellness examination for [place].  Exercise Activities and Dietary recommendations Current Exercise Habits: The patient does not participate in regular exercise at present, Exercise limited by: respiratory conditions(s)  Goals     DIET - INCREASE WATER INTAKE     Recommend to drink at least 6-8 8oz glasses of water per day.       Fall Risk Fall Risk  10/10/2018 05/28/2018 01/22/2018 10/04/2017 09/20/2017  Falls in the past year? 0 0 No No No  Number falls in past yr: 0 - - - -  Injury with Fall? 0 - - - -  Risk for fall due to : - - - Impaired vision -  Risk for fall due to: Comment - - - wears eyeglasses -  Follow up Falls prevention discussed - - - -   FALL RISK PREVENTION PERTAINING TO THE HOME:  Any stairs in or around the home? Yes  If so, do they handrails? Yes   Home free of loose throw rugs in walkways, pet beds, electrical cords, etc? Yes  Adequate lighting in your home to reduce risk of falls? Yes   ASSISTIVE DEVICES UTILIZED TO PREVENT FALLS:  Life alert? No  Use of a cane, walker or w/c? No  Grab bars in the bathroom? No  Shower chair or bench in shower? No  Elevated toilet seat or a handicapped toilet? No   DME ORDERS:  DME order needed?   No   TIMED UP AND GO:  Was the test performed? Yes .  Length of time to ambulate 10 feet: 6 sec.   GAIT:  Appearance of gait: Gait stead-fast and without the use of an assistive device.  Education: Fall risk prevention has been discussed.  Intervention(s) required? No    Depression Screen PHQ 2/9 Scores 10/10/2018 05/28/2018 01/22/2018 10/04/2017  PHQ - 2 Score 0 0 0 0  PHQ- 9 Score 0 0 - 0    Cognitive Function     6CIT Screen 10/10/2018 10/04/2017  What Year? 0 points 0 points  What month? 0 points 0 points  What time? 0 points 0 points  Count back from 20 0 points 0 points  Months in reverse 0 points 0 points  Repeat phrase 0 points 2 points  Total Score 0 2    Immunization History  Administered Date(s) Administered   Influenza Split 01/13/2009   Influenza, High Dose Seasonal PF 01/18/2016, 01/22/2017, 01/22/2018   Influenza, Seasonal, Injecte, Preservative Fre 01/31/2011   Influenza,inj,Quad PF,6+ Mos 02/21/2013, 03/02/2014, 11/25/2014   Influenza-Unspecified 03/02/2014   Pneumococcal Conjugate-13 06/30/2014   Pneumococcal Polysaccharide-23 06/14/2009, 10/14/2015   Pneumococcal-Unspecified 06/14/2009   Td 11/10/2008   Tdap 11/10/2008   Zoster 10/03/2011    Qualifies for Shingles Vaccine? Yes  Zostavax completed 2013. Due for Shingrix. Education has been provided regarding the importance of this vaccine. Pt has been advised to call insurance company to determine out of pocket expense. Advised may also receive vaccine at local pharmacy or Health Dept. Verbalized acceptance and understanding.  Tdap: Up to date  Flu Vaccine: Up to date  Pneumococcal Vaccine: Up to date   Screening Tests Health Maintenance  Topic Date Due   INFLUENZA VACCINE  11/09/2018   TETANUS/TDAP  12/08/2018   COLONOSCOPY  04/24/2019   Hepatitis C Screening  Completed   PNA vac Low Risk Adult  Completed   Cancer Screenings:  Colorectal Screening: Completed 04/23/09.  Repeat every 10 year  Lung Cancer Screening: (Low Dose CT Chest recommended if Age 44-80 years, 30 pack-year currently smoking OR have quit w/in 15years.) does qualify. Completed 08/19/18   Additional Screening:  Hepatitis C Screening: does qualify; Completed 02/06/12  Vision Screening: Recommended annual ophthalmology exams for early detection of glaucoma and other disorders of the eye. Is the patient up to date with their annual eye exam?  Yes  Who is the provider or what is the name of the office in which the pt attends annual eye exams? [facility]: Recommended annual dental exams for proper oral hygiene  Community Resource Referral:  CRR required this visit?  No       Plan:    I have personally reviewed and addressed the Medicare Annual Wellness questionnaire and have noted the following in the patients chart:  A. Medical and social history B. Use of alcohol, tobacco or illicit drugs  C. Current medications and supplements D. Functional ability and status E.  Nutritional status F.  Physical activity G. Advance directives H. List of other physicians I.  Hospitalizations, surgeries, and ER visits in previous 12 months J.  [place] such as hearing and vision if needed, cognitive and depression L. Referrals and appointments   In addition, I have reviewed and discussed with patient certain preventive protocols, quality metrics, and best practice recommendations. A written personalized care plan for preventive services as well as general preventive health recommendations were provided to patient.   Signed,  Clemetine Marker, LPN Nurse Health Advisor   Nurse Notes:pt doing well and appreciative of visit today. He has increased his smoking back to 1.5-2 packs per day; states he wants to quit but doesn't have the motivation to. Same day appt today with Dr. Ancil Boozer.

## 2018-10-11 LAB — CBC WITH DIFFERENTIAL/PLATELET
Absolute Monocytes: 671 cells/uL (ref 200–950)
Basophils Absolute: 34 cells/uL (ref 0–200)
Basophils Relative: 0.4 %
Eosinophils Absolute: 77 cells/uL (ref 15–500)
Eosinophils Relative: 0.9 %
HCT: 42.3 % (ref 38.5–50.0)
Hemoglobin: 14.4 g/dL (ref 13.2–17.1)
Lymphs Abs: 1737 cells/uL (ref 850–3900)
MCH: 31.4 pg (ref 27.0–33.0)
MCHC: 34 g/dL (ref 32.0–36.0)
MCV: 92.4 fL (ref 80.0–100.0)
MPV: 12.2 fL (ref 7.5–12.5)
Monocytes Relative: 7.8 %
Neutro Abs: 6080 cells/uL (ref 1500–7800)
Neutrophils Relative %: 70.7 %
Platelets: 169 10*3/uL (ref 140–400)
RBC: 4.58 10*6/uL (ref 4.20–5.80)
RDW: 12 % (ref 11.0–15.0)
Total Lymphocyte: 20.2 %
WBC: 8.6 10*3/uL (ref 3.8–10.8)

## 2018-10-11 LAB — COMPLETE METABOLIC PANEL WITH GFR
AG Ratio: 1.7 (calc) (ref 1.0–2.5)
ALT: 19 U/L (ref 9–46)
AST: 30 U/L (ref 10–35)
Albumin: 4.1 g/dL (ref 3.6–5.1)
Alkaline phosphatase (APISO): 52 U/L (ref 35–144)
BUN/Creatinine Ratio: 12 (calc) (ref 6–22)
BUN: 18 mg/dL (ref 7–25)
CO2: 26 mmol/L (ref 20–32)
Calcium: 9.7 mg/dL (ref 8.6–10.3)
Chloride: 107 mmol/L (ref 98–110)
Creat: 1.49 mg/dL — ABNORMAL HIGH (ref 0.70–1.18)
GFR, Est African American: 54 mL/min/{1.73_m2} — ABNORMAL LOW (ref >=60)
GFR, Est Non African American: 47 mL/min/{1.73_m2} — ABNORMAL LOW (ref >=60)
Globulin: 2.4 g/dL (calc) (ref 1.9–3.7)
Glucose, Bld: 93 mg/dL (ref 65–99)
Potassium: 4 mmol/L (ref 3.5–5.3)
Sodium: 141 mmol/L (ref 135–146)
Total Bilirubin: 0.9 mg/dL (ref 0.2–1.2)
Total Protein: 6.5 g/dL (ref 6.1–8.1)

## 2018-11-11 ENCOUNTER — Other Ambulatory Visit: Payer: Self-pay | Admitting: Family Medicine

## 2018-11-11 DIAGNOSIS — J449 Chronic obstructive pulmonary disease, unspecified: Secondary | ICD-10-CM

## 2018-11-29 ENCOUNTER — Ambulatory Visit (INDEPENDENT_AMBULATORY_CARE_PROVIDER_SITE_OTHER): Payer: Medicare Other

## 2018-11-29 DIAGNOSIS — Z23 Encounter for immunization: Secondary | ICD-10-CM | POA: Diagnosis not present

## 2019-01-15 DIAGNOSIS — H2511 Age-related nuclear cataract, right eye: Secondary | ICD-10-CM | POA: Diagnosis not present

## 2019-01-22 ENCOUNTER — Other Ambulatory Visit: Payer: Self-pay

## 2019-01-22 DIAGNOSIS — N138 Other obstructive and reflux uropathy: Secondary | ICD-10-CM

## 2019-01-23 ENCOUNTER — Other Ambulatory Visit: Payer: Self-pay

## 2019-01-23 ENCOUNTER — Other Ambulatory Visit: Payer: Medicare Other

## 2019-01-23 DIAGNOSIS — N138 Other obstructive and reflux uropathy: Secondary | ICD-10-CM | POA: Diagnosis not present

## 2019-01-23 DIAGNOSIS — N401 Enlarged prostate with lower urinary tract symptoms: Secondary | ICD-10-CM

## 2019-01-24 LAB — PSA: Prostate Specific Ag, Serum: 2 ng/mL (ref 0.0–4.0)

## 2019-01-29 NOTE — Progress Notes (Signed)
01/30/2019 9:31 AM   Kevin Roberts [birth year] [id number]  Referring provider: Steele Sizer, MD [address] [place],  [place] [address]  Chief Complaint  Patient presents with  . Benign Prostatic Hypertrophy    HPI: Patient is a 73 year old male who presents today for his yearly office visit for BPH with LUTS managed with tamsulosin.  BPH WITH LUTS His IPSS score today is 7, which is mild lower urinary tract symptomatology. He is pleased with his quality life due to his urinary symptoms.  His previous IPSS score was 7/1.  His previous PVR is 27 mL.   He denies any dysuria, hematuria or suprapubic pain.  He currently taking tamsulosin 0.4 mg daily.  He also denies any recent fevers, chills, nausea or vomiting.   His baby brother has been diagnosed with prostate cancer, he is in his late 23's.    IPSS    Row Name 01/30/19 0900         International Prostate Symptom Score   How often have you had the sensation of not emptying your bladder?  Less than 1 in 5     How often have you had to urinate less than every two hours?  Less than 1 in 5 times     How often have you found you stopped and started again several times when you urinated?  Less than 1 in 5 times     How often have you found it difficult to postpone urination?  Less than 1 in 5 times     How often have you had a weak urinary stream?  Less than 1 in 5 times     How often have you had to strain to start urination?  Less than 1 in 5 times     How many times did you typically get up at night to urinate?  1 Time     Total IPSS Score  7       Quality of Life due to urinary symptoms   If you were to spend the rest of your life with your urinary condition just the way it is now how would you feel about that?  Pleased        Score:  1-7 Mild 8-19 Moderate 20-35 Severe  Renal lesion Left renal lesions/cysts seen on chest CT in 09/2008, abdominal ultrasound 10/2014, chest CT 06/2017 and chest CT in  08/2018.   His UA today is negative.    PMH: Past Medical History:  Diagnosis Date  . Benign prostatic hypertrophy   . CAD (coronary atherosclerotic disease)   . Chronic kidney disease, stage III (moderate)   . COPD (chronic obstructive pulmonary disease) ([place])   . Cramps, muscle, general   . Elevated ferritin   . Emphysema of lung ([place])   . GERD (gastroesophageal reflux disease)   . History of hypercalcemia   . Hyperlipidemia   . Hypertension   . Incomplete bladder emptying   . Nocturia   . Peripheral polyneuropathy   . Rising PSA level   . Tobacco use   . Urgency of micturation   . Vitamin B 12 deficiency     Surgical History: Past Surgical History:  Procedure Laterality Date  . CARDIAC CATHETERIZATION N/A 07/02/2015   Procedure: Left Heart Cath and Coronary Angiography;  Surgeon: Wellington Hampshire, MD;  Location: [facility];  Service: Cardiovascular;  Laterality: N/A;  . COLONOSCOPY  2011   JWB  . ESOPHAGOGASTRODUODENOSCOPY (EGD) WITH PROPOFOL  N/A 11/14/2017   Procedure: ESOPHAGOGASTRODUODENOSCOPY (EGD) WITH PROPOFOL;  Surgeon: Robert Bellow, MD;  Location: [facility];  Service: Endoscopy;  Laterality: N/A;  . throat biopsy     lump removed    Home Medications:  Allergies as of 01/30/2019   No Known Allergies     Medication List       Accurate as of January 30, 2019  9:31 AM. If you have any questions, ask your nurse or doctor.        Anoro Ellipta 62.5-25 MCG/INH Aepb Generic drug: umeclidinium-vilanterol INHALE 1 PUFF BY MOUTH INTO THE LUNGS DAILY   aspirin 81 MG tablet Take 1 tablet by mouth daily.   atorvastatin 40 MG tablet Commonly known as: LIPITOR Take 1 tablet (40 mg total) by mouth daily.   B-12 1000 MCG Tbcr Take 1 tablet by mouth daily.   gabapentin 300 MG capsule Commonly known as: NEURONTIN Take 1 capsule (300 mg total) by mouth 2 (two) times daily.   MULTIPLE VITAMIN PO Take 1 tablet by mouth daily.    nitroGLYCERIN 0.4 MG SL tablet Commonly known as: NITROSTAT Place 1 tablet (0.4 mg total) under the tongue every 5 (five) minutes as needed for chest pain.   pantoprazole 40 MG tablet Commonly known as: PROTONIX TAKE 1 TABLET BY MOUTH DAILY   tamsulosin 0.4 MG Caps capsule Commonly known as: FLOMAX Take 1 capsule (0.4 mg total) by mouth daily.   Vitamin D (Cholecalciferol) 25 MCG (1000 UT) Caps Take 1 tablet by mouth daily.       Allergies: No Known Allergies  Family History: Family History  Problem Relation Age of Onset  . Hypertension Mother   . CVA Mother   . Cancer Father        Lung  . Mental illness Brother        1-Schizophrenia  . GER disease Son   . Healthy Sister   . Heart Problems Brother   . Gallbladder disease Brother   . Healthy Sister   . Gallbladder disease Brother   . Hypertension Brother     Social History:  reports that he has been smoking cigarettes. He started smoking about 45 years ago. He has a 82.00 pack-year smoking history. He has never used smokeless tobacco. He reports that he does not drink alcohol or use drugs.  ROS: UROLOGY Frequent Urination?: No Hard to postpone urination?: No Burning/pain with urination?: No Get up at night to urinate?: No Leakage of urine?: No Urine stream starts and stops?: No Trouble starting stream?: No Do you have to strain to urinate?: No Blood in urine?: No Urinary tract infection?: No Sexually transmitted disease?: No Injury to kidneys or bladder?: No Painful intercourse?: No Weak stream?: No Erection problems?: No Penile pain?: No  Gastrointestinal Nausea?: No Vomiting?: No Indigestion/heartburn?: No Diarrhea?: No Constipation?: No  Constitutional Fever: No Night sweats?: No Weight loss?: No Fatigue?: No  Skin Skin rash/lesions?: No Itching?: No  Eyes Blurred vision?: No Double vision?: No  Ears/Nose/Throat Sore throat?: No Sinus problems?: No  Hematologic/Lymphatic  Swollen glands?: No Easy bruising?: No  Cardiovascular Leg swelling?: No Chest pain?: No  Respiratory Cough?: No Shortness of breath?: No  Endocrine Excessive thirst?: No  Musculoskeletal Back pain?: No Joint pain?: No  Neurological Headaches?: No Dizziness?: No  Psychologic Depression?: No Anxiety?: No  Physical Exam: BP 128/69   Pulse 82   Ht 5\' 11"  (1.803 m)   Wt 157 lb (71.2 kg)   BMI 21.90  kg/m   Constitutional:  Well nourished. Alert and oriented, No acute distress. HEENT: [place] AT, moist mucus membranes.  Trachea midline, no masses. Cardiovascular: No clubbing, cyanosis, or edema. Respiratory: Normal respiratory effort, no increased work of breathing. GI: Abdomen is soft, non tender, non distended, no abdominal masses. Liver and spleen not palpable.  No hernias appreciated.  Stool sample for occult testing is not indicated.   GU: No CVA tenderness.  No bladder fullness or masses.  Patient with circumcised phallus.  Urethral meatus is patent.  No penile discharge. No penile lesions or rashes. Scrotum without lesions, cysts, rashes and/or edema.  Testicles are located scrotally bilaterally. No masses are appreciated in the testicles. Left and right epididymis are normal. Rectal: Patient with  normal sphincter tone. Anus and perineum without scarring or rashes. No rectal masses are appreciated. Prostate is approximately 50 grams, no nodules are appreciated. Seminal vesicles could not be palpated.  Skin: No rashes, bruises or suspicious lesions. Lymph: No nguinal adenopathy. Neurologic: Grossly intact, no focal deficits, moving all 4 extremities. Psychiatric: Normal mood and affect.  Laboratory Data: Lab Results  Component Value Date   CREATININE 1.49 (H) 10/10/2018   Lab Results  Component Value Date   PSA 2.0 09/23/2013   PSA history:  1.8 ng/mL on 02/02/2014  Component     Latest Ref Rng & Units 02/03/2015 01/27/2016 01/25/2017 01/30/2018  Prostate  Specific Ag, Serum     0.0 - 4.0 ng/mL 1.9 2.1 1.6 1.9   Component     Latest Ref Rng & Units 01/23/2019  Prostate Specific Ag, Serum     0.0 - 4.0 ng/mL 2.0   1. BPH with LUTS  - IPSS score is 7/1, it is stable   - Continue conservative management, avoiding bladder irritants and timed voiding's  - Continue tamsulosin 0.4 mg daily  - RTC in 12 months for IPSS, PSA and exam   2. Left renal lesions/cysts  Obtain RUS for further evaluation Will call with results   Return in about 1 year (around 01/30/2020) for IPSS, PSA and exam.  Zara Council, [facility]  [place], [place] [address] [phone]

## 2019-01-30 ENCOUNTER — Ambulatory Visit (INDEPENDENT_AMBULATORY_CARE_PROVIDER_SITE_OTHER): Payer: Medicare Other | Admitting: Urology

## 2019-01-30 ENCOUNTER — Other Ambulatory Visit: Payer: Self-pay

## 2019-01-30 ENCOUNTER — Encounter: Payer: Self-pay | Admitting: Urology

## 2019-01-30 VITALS — BP 128/69 | HR 82 | Ht 71.0 in | Wt 157.0 lb

## 2019-01-30 DIAGNOSIS — N281 Cyst of kidney, acquired: Secondary | ICD-10-CM

## 2019-01-30 DIAGNOSIS — N401 Enlarged prostate with lower urinary tract symptoms: Secondary | ICD-10-CM | POA: Diagnosis not present

## 2019-01-30 DIAGNOSIS — N138 Other obstructive and reflux uropathy: Secondary | ICD-10-CM

## 2019-01-30 DIAGNOSIS — R350 Frequency of micturition: Secondary | ICD-10-CM | POA: Diagnosis not present

## 2019-01-30 DIAGNOSIS — I208 Other forms of angina pectoris: Secondary | ICD-10-CM | POA: Diagnosis not present

## 2019-01-30 LAB — MICROSCOPIC EXAMINATION: Bacteria, UA: NONE SEEN

## 2019-01-30 LAB — URINALYSIS, COMPLETE
Bilirubin, UA: NEGATIVE
Glucose, UA: NEGATIVE
Leukocytes,UA: NEGATIVE
Nitrite, UA: NEGATIVE
Protein,UA: NEGATIVE
RBC, UA: NEGATIVE
Specific Gravity, UA: 1.02 (ref 1.005–1.030)
Urobilinogen, Ur: 1 mg/dL (ref 0.2–1.0)
pH, UA: 6 (ref 5.0–7.5)

## 2019-01-30 MED ORDER — TAMSULOSIN HCL 0.4 MG PO CAPS: 0.4000 mg | ORAL_CAPSULE | Freq: Every day | ORAL | 3 refills | Status: DC

## 2019-02-10 ENCOUNTER — Other Ambulatory Visit: Payer: Self-pay | Admitting: Family Medicine

## 2019-02-10 DIAGNOSIS — E785 Hyperlipidemia, unspecified: Secondary | ICD-10-CM

## 2019-02-12 ENCOUNTER — Ambulatory Visit
Admission: RE | Admit: 2019-02-12 | Discharge: 2019-02-12 | Disposition: A | Discharge status: Home / Self Care | Payer: Medicare Other | Source: Ambulatory Visit | Attending: Urology | Admitting: Urology

## 2019-02-12 ENCOUNTER — Other Ambulatory Visit: Payer: Self-pay

## 2019-02-12 DIAGNOSIS — N281 Cyst of kidney, acquired: Secondary | ICD-10-CM | POA: Insufficient documentation

## 2019-02-13 ENCOUNTER — Ambulatory Visit (INDEPENDENT_AMBULATORY_CARE_PROVIDER_SITE_OTHER): Payer: Medicare Other | Admitting: Family Medicine

## 2019-02-13 ENCOUNTER — Encounter: Payer: Self-pay | Admitting: Family Medicine

## 2019-02-13 VITALS — BP 112/64 | HR 78 | Temp 96.6°F | Resp 16 | Ht 71.0 in | Wt 158.4 lb

## 2019-02-13 DIAGNOSIS — Z8711 Personal history of peptic ulcer disease: Secondary | ICD-10-CM

## 2019-02-13 DIAGNOSIS — F1021 Alcohol dependence, in remission: Secondary | ICD-10-CM | POA: Diagnosis not present

## 2019-02-13 DIAGNOSIS — K219 Gastro-esophageal reflux disease without esophagitis: Secondary | ICD-10-CM

## 2019-02-13 DIAGNOSIS — N62 Hypertrophy of breast: Secondary | ICD-10-CM | POA: Diagnosis not present

## 2019-02-13 DIAGNOSIS — Z8679 Personal history of other diseases of the circulatory system: Secondary | ICD-10-CM

## 2019-02-13 DIAGNOSIS — Z8719 Personal history of other diseases of the digestive system: Secondary | ICD-10-CM

## 2019-02-13 DIAGNOSIS — E785 Hyperlipidemia, unspecified: Secondary | ICD-10-CM | POA: Diagnosis not present

## 2019-02-13 DIAGNOSIS — I700 Atherosclerosis of aorta: Secondary | ICD-10-CM

## 2019-02-13 DIAGNOSIS — I2510 Atherosclerotic heart disease of native coronary artery without angina pectoris: Secondary | ICD-10-CM | POA: Diagnosis not present

## 2019-02-13 DIAGNOSIS — I208 Other forms of angina pectoris: Secondary | ICD-10-CM

## 2019-02-13 DIAGNOSIS — J449 Chronic obstructive pulmonary disease, unspecified: Secondary | ICD-10-CM

## 2019-02-13 DIAGNOSIS — N1831 Chronic kidney disease, stage 3a: Secondary | ICD-10-CM

## 2019-02-13 DIAGNOSIS — D696 Thrombocytopenia, unspecified: Secondary | ICD-10-CM

## 2019-02-13 DIAGNOSIS — N281 Cyst of kidney, acquired: Secondary | ICD-10-CM

## 2019-02-13 MED ORDER — NITROGLYCERIN 0.4 MG SL SUBL: 0.4000 mg | SUBLINGUAL_TABLET | SUBLINGUAL | 0 refills | Status: DC | PRN

## 2019-02-13 MED ORDER — PANTOPRAZOLE SODIUM 40 MG PO TBEC: 40.0000 mg | DELAYED_RELEASE_TABLET | Freq: Every day | ORAL | 1 refills | Status: DC

## 2019-02-13 MED ORDER — ANORO ELLIPTA 62.5-25 MCG/INH IN AEPB: INHALATION_SPRAY | RESPIRATORY_TRACT | 3 refills | Status: DC

## 2019-02-13 NOTE — Progress Notes (Signed)
Name: Kevin Roberts   MRN: [id number]    DOB: 1945-07-25   Date:02/13/2019       Progress Note  Subjective  Chief Complaint  Chief Complaint  Patient presents with  . Medication Refill  . COPD  . History of Alcoholism  . low BP  . Hyperlipidemia  . Gastroesophageal Reflux  . Coronary Artery Disease  . CKI: stage III    HPI  COPD Moderate : he is takingAnoro,he denies any cough, wheezing , he is not very active, states SOB only with moderate activity, but still able to do his own yard . He still usually smoking 1 pack daily, sometimes more when sitting at home all day feeling bored , CT chest showed emphysema.   History of Alcoholism: he used to drink before retirement - about 2 shots at night, however when he retired at age 21 he started to drink about 1/5 of liquor every two days. He states he stopped drinking because he fell on his face, developed a gastric ulcer and B12 deficiency. He is doing well since he stopped drinkingaround 2010.He still has burning on his legs from neuropathy, taking gabapentin, symptoms not as intense and not as constant. Unchanged   GERD and history of gastric ulcer: He states symptoms controlled as long as he takes a PPI, no heartburn or regurgitation. He was seen by Dr. Fleet Contras and had EGD in August2019, large diverticulum but no signs of bleeding and has hiatal hernia small in size.Had indigestion a couple of weeks ago. Still taking Tums prn and also on pantoprazole daily   Hyperlipidemia: taking Atorvastatin and denies side effects. Taking aspirin daily . Denies myalgia. Unchanged   CAD: found on CT chest 3 vessel disease he denies chest pain ,it may be multifactorial from COPD also. He was seen by Dr. Fletcher Anon and advised to quit smoking. On aspirin, statins and beta-blocker. Cardiac cath in 2017 showed mild to moderate non-obstructive disease and is only on medical management. He saw Dr. Fletcher Anon in [id number] needs a refill of NTG, he states  two weeks he had chest pain/indigestion on the right side for 3-4 nights, he is not aware of any dietary changes, he states NTG improve symptoms, doing well since   B12 neuropathy: he is taking B12 supplementation and also takes Neurontin, he states tingling has improved but still present. Tips of fingers and also both legs and feet. Unchanged   Tobacco Abuse: he has been a smoker for 43years, he is up to date with triple A screening, negative in 2016, chest CT done in 11/2013 . 06/2017, and last one 08/2018. Explained importance of quitting smoking again, he states he used patches but could not stop. He is on statin therapy and baby aspirin, he has been smoking a little more lately, states he does not have anything else to do   Atherosclerosis abdominal aorta: on medical management, aspirin,  and statin therapy.He is not ready to quit smoking yet.   CKI: stage III,he avoiding NSAID's, no pruritis, good urine output , reviewed last labs and stable  Renal Cyst: picked up on CT lung and had follow up renal US, benign cyst   Patient Active Problem List   Diagnosis Date Noted  . Hiatal hernia 01/22/2018  . Abdominal pain, chronic, right lower quadrant 10/26/2017  . Esophageal diverticulum 10/26/2017  . History of alcoholism ([place]) 01/18/2016  . Gynecomastia 10/14/2015  . Abnormal nuclear stress test   . Anginal equivalent ([place])   . Atherosclerosis of  aorta ([place]) 04/15/2015  . BPH with obstruction/lower urinary tract symptoms 02/03/2015  . Renal cyst, left 10/13/2014  . Tobacco use   . B12 neuropathy ([place]) 10/04/2014  . CAD, multiple vessel 10/04/2014  . Benign essential HTN 10/04/2014  . Chronic kidney disease (CKD), stage III (moderate) 10/04/2014  . Dyslipidemia 10/04/2014  . COPD, mild ([place]) 10/04/2014  . Gastro-esophageal reflux disease without esophagitis 10/04/2014  . Enlarged prostate 10/04/2014  . Vitamin D deficiency 10/04/2014    Past Surgical History:  Procedure  Laterality Date  . CARDIAC CATHETERIZATION N/A 07/02/2015   Procedure: Left Heart Cath and Coronary Angiography;  Surgeon: Wellington Hampshire, MD;  Location: [facility];  Service: Cardiovascular;  Laterality: N/A;  . COLONOSCOPY  2011   JWB  . ESOPHAGOGASTRODUODENOSCOPY (EGD) WITH PROPOFOL N/A 11/14/2017   Procedure: ESOPHAGOGASTRODUODENOSCOPY (EGD) WITH PROPOFOL;  Surgeon: Robert Bellow, MD;  Location: [facility];  Service: Endoscopy;  Laterality: N/A;  . throat biopsy     lump removed    Family History  Problem Relation Age of Onset  . Hypertension Mother   . CVA Mother   . Cancer Father        Lung  . Mental illness Brother        1-Schizophrenia  . GER disease Son   . Healthy Sister   . Heart Problems Brother   . Gallbladder disease Brother   . Healthy Sister   . Gallbladder disease Brother   . Hypertension Brother     Social History   Socioeconomic History  . Marital status: Married    Spouse name: Claudine  . Number of children: 3  . Years of education: some college  . Highest education level: 12th grade  Occupational History  . Occupation: Retired  Scientific laboratory technician  . Financial resource strain: Not hard at all  . Food insecurity    Worry: Never true    Inability: Never true  . Transportation needs    Medical: No    Non-medical: No  Tobacco Use  . Smoking status: Current Every Day Smoker    Packs/day: 2.00    Years: 41.00    Pack years: 82.00    Types: Cigarettes    Start date: 10/04/1973  . Smokeless tobacco: Never Used  Substance and Sexual Activity  . Alcohol use: No    Alcohol/week: 0.0 standard drinks  . Drug use: No  . Sexual activity: Not Currently    Partners: Female  Lifestyle  . Physical activity    Days per week: 0 days    Minutes per session: 0 min  . Stress: Not at all  Relationships  . Social Herbalist on phone: Twice a week    Gets together: More than three times a week    Attends religious service: Never     Active member of club or organization: No    Attends meetings of clubs or organizations: Never    Relationship status: Married  . Intimate partner violence    Fear of current or ex partner: No    Emotionally abused: No    Physically abused: No    Forced sexual activity: No  Other Topics Concern  . Not on file  Social History Narrative  . Not on file     Current Outpatient Medications:  .  ANORO ELLIPTA 62.5-25 MCG/INH AEPB, INHALE 1 PUFF BY MOUTH INTO THE LUNGS DAILY, Disp: 60 each, Rfl: 3 .  aspirin 81 MG tablet, Take  1 tablet by mouth daily., Disp: , Rfl:  .  atorvastatin (LIPITOR) 40 MG tablet, TAKE 1 TABLET BY MOUTH DAILY, Disp: 90 tablet, Rfl: 1 .  Cyanocobalamin (B-12) 1000 MCG TBCR, Take 1 tablet by mouth daily., Disp: , Rfl:  .  gabapentin (NEURONTIN) 300 MG capsule, Take 1 capsule (300 mg total) by mouth 2 (two) times daily., Disp: 180 capsule, Rfl: 1 .  MULTIPLE VITAMIN PO, Take 1 tablet by mouth daily., Disp: , Rfl:  .  nitroGLYCERIN (NITROSTAT) 0.4 MG SL tablet, Place 1 tablet (0.4 mg total) under the tongue every 5 (five) minutes as needed for chest pain., Disp: 30 tablet, Rfl: 0 .  pantoprazole (PROTONIX) 40 MG tablet, TAKE 1 TABLET BY MOUTH DAILY, Disp: 90 tablet, Rfl: 1 .  tamsulosin (FLOMAX) 0.4 MG CAPS capsule, Take 1 capsule (0.4 mg total) by mouth daily., Disp: 90 capsule, Rfl: 3 .  Vitamin D, Cholecalciferol, 1000 UNITS CAPS, Take 1 tablet by mouth daily., Disp: , Rfl:   No Known Allergies  I personally reviewed active problem list, medication list, allergies, family history, social history, health maintenance with the patient/caregiver today.   ROS  Constitutional: Negative for fever or weight change.  Respiratory: Negative for cough and shortness of breath.   Cardiovascular: Negative for chest pain or palpitations.  Gastrointestinal: Negative for abdominal pain, no bowel changes.  Musculoskeletal: Negative for gait problem or joint swelling.  Skin:  Negative for rash.  Neurological: Negative for dizziness or headache.  No other specific complaints in a complete review of systems (except as listed in HPI above).  Objective  Vitals:   02/13/19 0859  BP: 112/64  Pulse: 78  Resp: 16  Temp: (!) 96.6 F (35.9 C)  TempSrc: Temporal  SpO2: 99%  Weight: 158 lb 6.4 oz (71.8 kg)  Height: 5\' 11"  (1.803 m)    Body mass index is 22.09 kg/m.  Physical Exam  Constitutional: Patient appears well-developed and very thin. No distress.  HEENT: head atraumatic, normocephalic, pupils equal and reactive to light Cardiovascular: Normal rate, regular rhythm and normal heart sounds.  No murmur heard. No BLE edema. Pulmonary/Chest: Effort normal and breath sounds normal. No respiratory distress. Abdominal: Soft.  There is no tenderness. Psychiatric: Patient has a normal mood and affect. behavior is normal. Judgment and thought content normal.  Recent Results (from the past 2160 hour(s))  PSA     Status: None   Collection Time: 01/23/19  9:16 AM  Result Value Ref Range   Prostate Specific Ag, Serum 2.0 0.0 - 4.0 ng/mL    Comment: Roche ECLIA methodology. According to the American Urological Association, Serum PSA should decrease and remain at undetectable levels after radical prostatectomy. The AUA defines biochemical recurrence as an initial PSA value 0.2 ng/mL or greater followed by a subsequent confirmatory PSA value 0.2 ng/mL or greater. Values obtained with different assay methods or kits cannot be used interchangeably. Results cannot be interpreted as absolute evidence of the presence or absence of malignant disease.   Urinalysis, Complete     Status: Abnormal   Collection Time: 01/30/19  9:01 AM  Result Value Ref Range   Specific Gravity, UA 1.020 1.005 - 1.030   pH, UA 6.0 5.0 - 7.5   Color, UA Yellow Yellow   Appearance Ur Clear Clear   Leukocytes,UA Negative Negative   Protein,UA Negative Negative/Trace   Glucose, UA  Negative Negative   Ketones, UA Trace (A) Negative   RBC, UA Negative Negative  Bilirubin, UA Negative Negative   Urobilinogen, Ur 1.0 0.2 - 1.0 mg/dL   Nitrite, UA Negative Negative   Microscopic Examination See below:   Microscopic Examination     Status: None   Collection Time: 01/30/19  9:01 AM   URINE  Result Value Ref Range   WBC, UA 0-5 0 - 5 /hpf   RBC 0-2 0 - 2 /hpf   Epithelial Cells (non renal) 0-10 0 - 10 /hpf   Bacteria, UA None seen None seen/Few     PHQ2/9: Depression screen [facility] 2/9 02/13/2019 10/10/2018 05/28/2018 01/22/2018 10/04/2017  Decreased Interest 0 0 0 0 0  Down, Depressed, Hopeless 0 0 0 0 0  PHQ - 2 Score 0 0 0 0 0  Altered sleeping 0 0 0 - 0  Tired, decreased energy 0 0 0 - 0  Change in appetite 0 0 0 - 0  Feeling bad or failure about yourself  0 0 0 - 0  Trouble concentrating 0 0 0 - 0  Moving slowly or fidgety/restless 0 0 0 - 0  Suicidal thoughts 0 0 0 - 0  PHQ-9 Score 0 0 0 - 0  Difficult doing work/chores Not difficult at all Not difficult at all Not difficult at all - Not difficult at all    phq 9 is negative   Fall Risk: Fall Risk  02/13/2019 10/10/2018 05/28/2018 01/22/2018 10/04/2017  Falls in the past year? 0 0 0 No No  Number falls in past yr: 0 0 - - -  Injury with Fall? 0 0 - - -  Risk for fall due to : - - - - Impaired vision  Risk for fall due to: Comment - - - - wears eyeglasses  Follow up - Falls prevention discussed - - -     Functional Status Survey: Is the patient deaf or have difficulty hearing?: No Does the patient have difficulty seeing, even when wearing glasses/contacts?: Yes Does the patient have difficulty concentrating, remembering, or making decisions?: No Does the patient have difficulty walking or climbing stairs?: No Does the patient have difficulty dressing or bathing?: No Does the patient have difficulty doing errands alone such as visiting a doctor's office or shopping?: No    Assessment & Plan  1.  Gynecomastia  - Testosterone Total,Free,Bio, Males Found on CT chest  2. Thrombocytopenia ([place])  Last level was normal, recheck next visit   3. Dyslipidemia  On statin   4. History of alcoholism ([place])  He is still in remission   5. Atherosclerosis of aorta ([facility])  - nitroGLYCERIN (NITROSTAT) 0.4 MG SL tablet; Place 1 tablet (0.4 mg total) under the tongue every 5 (five) minutes as needed for chest pain.  Dispense: 30 tablet; Refill: 0  6. COPD, moderate ([place])  - umeclidinium-vilanterol (ANORO ELLIPTA) 62.5-25 MCG/INH AEPB; INHALE 1 PUFF BY MOUTH INTO THE LUNGS DAILY  Dispense: 60 each; Refill: 3  7.  History of  HTN  Off metoprolol and bp is at goal   8. Stage 3a chronic kidney disease  Recheck labs next visit   9 . Anginal equivalent ([facility])  - nitroGLYCERIN (NITROSTAT) 0.4 MG SL tablet; Place 1 tablet (0.4 mg total) under the tongue every 5 (five) minutes as needed for chest pain.  Dispense: 30 tablet; Refill: 0  10. CAD, multiple vessel  Stable, had some chest pain two weeks ago but atypical   11. GERD without esophagitis  - pantoprazole (PROTONIX) 40 MG tablet; Take 1 tablet (40  mg total) by mouth daily.  Dispense: 90 tablet; Refill: 1  12. Renal cyst, left  Had US done by Urologist   13. History of gastric ulcer  - pantoprazole (PROTONIX) 40 MG tablet; Take 1 tablet (40 mg total) by mouth daily.  Dispense: 90 tablet; Refill: 1

## 2019-02-14 LAB — TESTOSTERONE TOTAL,FREE,BIO, MALES
Albumin: 4.3 g/dL (ref 3.6–5.1)
Sex Hormone Binding: 50 nmol/L (ref 22–77)
Testosterone, Bioavailable: 139.5 ng/dL (ref 15.0–150.0)
Testosterone, Free: 70.8 pg/mL (ref 6.0–73.0)
Testosterone: 708 ng/dL (ref 250–827)

## 2019-02-25 ENCOUNTER — Telehealth: Payer: Self-pay | Admitting: Family Medicine

## 2019-02-25 NOTE — Telephone Encounter (Signed)
Left message for patient to return call.

## 2019-02-25 NOTE — Telephone Encounter (Signed)
Patient notified and voiced understanding.

## 2019-02-25 NOTE — Telephone Encounter (Signed)
-----   Message from Nori Riis, PA-C sent at 02/25/2019 10:21 AM EST ----- Please let Mr. Ambroise know that his ultrasound of his kidneys noted a simple cyst and this is benign.  We do not need to do any further studies on the cyst.

## 2019-05-23 ENCOUNTER — Ambulatory Visit: Payer: Medicare Other | Attending: Internal Medicine

## 2019-05-23 DIAGNOSIS — Z20822 Contact with and (suspected) exposure to covid-19: Secondary | ICD-10-CM | POA: Diagnosis not present

## 2019-05-24 LAB — NOVEL CORONAVIRUS, NAA: SARS-CoV-2, NAA: NOT DETECTED

## 2019-05-26 ENCOUNTER — Telehealth: Payer: Self-pay

## 2019-05-26 NOTE — Telephone Encounter (Signed)
Pt notified of negative COVID-19 results. Understanding verbalized.  He requested that he and his wife be put on the vaccine wait list. I added them both to the form on line.    [place]

## 2019-06-05 ENCOUNTER — Ambulatory Visit: Payer: Medicare Other | Attending: Internal Medicine

## 2019-06-05 DIAGNOSIS — Z23 Encounter for immunization: Secondary | ICD-10-CM | POA: Insufficient documentation

## 2019-06-05 NOTE — Progress Notes (Signed)
   Covid-19 Vaccination Clinic  Name:  Kevin Roberts    MRN: [id number] DOB: [birth year]  06/05/2019  Kevin Roberts was observed post Covid-19 immunization for 15 minutes without incidence. He was provided with Vaccine Information Sheet and instruction to access the V-Safe system.   Kevin Roberts was instructed to call 911 with any severe reactions post vaccine: [place] Difficulty breathing  . Swelling of your face and throat  . A fast heartbeat  . A bad rash all over your body  . Dizziness and weakness    Immunizations Administered    Name Date Dose VIS Date Route   Pfizer COVID-19 Vaccine 06/05/2019 10:16 AM 0.3 mL 03/21/2019 Intramuscular   Manufacturer: [place]   Lot: [id number]   [place]: [id number]

## 2019-06-17 ENCOUNTER — Other Ambulatory Visit: Payer: Self-pay | Admitting: Family Medicine

## 2019-06-17 DIAGNOSIS — E538 Deficiency of other specified B group vitamins: Secondary | ICD-10-CM

## 2019-07-02 ENCOUNTER — Ambulatory Visit: Payer: Medicare Other | Attending: Internal Medicine

## 2019-07-02 DIAGNOSIS — Z23 Encounter for immunization: Secondary | ICD-10-CM

## 2019-07-02 NOTE — Progress Notes (Signed)
   Covid-19 Vaccination Clinic  Name:  Kevin Roberts    MRN: [id number] DOB: 1946-02-16  07/02/2019  Kevin Roberts was observed post Covid-19 immunization for 15 minutes without incident. He was provided with Vaccine Information Sheet and instruction to access the V-Safe system.   Kevin Roberts was instructed to call 911 with any severe reactions post vaccine: Marland Kitchen Difficulty breathing  . Swelling of face and throat  . A fast heartbeat  . A bad rash all over body  . Dizziness and weakness   Immunizations Administered    Name Date Dose VIS Date Route   Pfizer COVID-19 Vaccine 07/02/2019 10:13 AM 0.3 mL 03/21/2019 Intramuscular   Manufacturer: [employer]   Lot: [id number]   [place]: [id number]

## 2019-08-02 ENCOUNTER — Telehealth: Payer: Self-pay

## 2019-08-02 DIAGNOSIS — Z87891 Personal history of nicotine dependence: Secondary | ICD-10-CM

## 2019-08-02 NOTE — Telephone Encounter (Signed)
Patient has been notified that the low dose lung cancer screening CT scan is due currently or will be in near future.  Confirmed that patient is within the appropriate age range and asymptomatic, (no signs or symptoms of lung cancer).  Patient denies illness that would prevent curative treatment for lung cancer if found.  Patient is agreeable for CT scan being scheduled.  Verified smoking history (current smoker, with 42 year 1 ppd history).   CT has been scheduled for 08/21/19 @ 10:00.

## 2019-08-05 NOTE — Telephone Encounter (Signed)
Smoking history: current, 37 pack year

## 2019-08-05 NOTE — Addendum Note (Signed)
Addended by: Lieutenant Diego on: 08/05/2019 05:00 PM   Modules accepted: Orders

## 2019-08-13 ENCOUNTER — Ambulatory Visit (INDEPENDENT_AMBULATORY_CARE_PROVIDER_SITE_OTHER): Payer: Medicare Other | Admitting: Family Medicine

## 2019-08-13 ENCOUNTER — Encounter: Payer: Self-pay | Admitting: Family Medicine

## 2019-08-13 ENCOUNTER — Other Ambulatory Visit: Payer: Self-pay

## 2019-08-13 VITALS — BP 110/60 | HR 80 | Temp 97.3°F | Resp 16 | Ht 71.0 in | Wt 164.7 lb

## 2019-08-13 DIAGNOSIS — N1831 Chronic kidney disease, stage 3a: Secondary | ICD-10-CM | POA: Diagnosis not present

## 2019-08-13 DIAGNOSIS — I208 Other forms of angina pectoris: Secondary | ICD-10-CM

## 2019-08-13 DIAGNOSIS — Z1211 Encounter for screening for malignant neoplasm of colon: Secondary | ICD-10-CM

## 2019-08-13 DIAGNOSIS — I700 Atherosclerosis of aorta: Secondary | ICD-10-CM

## 2019-08-13 DIAGNOSIS — Z8711 Personal history of peptic ulcer disease: Secondary | ICD-10-CM

## 2019-08-13 DIAGNOSIS — K219 Gastro-esophageal reflux disease without esophagitis: Secondary | ICD-10-CM

## 2019-08-13 DIAGNOSIS — F1021 Alcohol dependence, in remission: Secondary | ICD-10-CM

## 2019-08-13 DIAGNOSIS — G63 Polyneuropathy in diseases classified elsewhere: Secondary | ICD-10-CM

## 2019-08-13 DIAGNOSIS — Z8679 Personal history of other diseases of the circulatory system: Secondary | ICD-10-CM

## 2019-08-13 DIAGNOSIS — J449 Chronic obstructive pulmonary disease, unspecified: Secondary | ICD-10-CM | POA: Diagnosis not present

## 2019-08-13 DIAGNOSIS — I2510 Atherosclerotic heart disease of native coronary artery without angina pectoris: Secondary | ICD-10-CM

## 2019-08-13 DIAGNOSIS — E538 Deficiency of other specified B group vitamins: Secondary | ICD-10-CM

## 2019-08-13 DIAGNOSIS — E785 Hyperlipidemia, unspecified: Secondary | ICD-10-CM | POA: Diagnosis not present

## 2019-08-13 DIAGNOSIS — Z8719 Personal history of other diseases of the digestive system: Secondary | ICD-10-CM

## 2019-08-13 DIAGNOSIS — L729 Follicular cyst of the skin and subcutaneous tissue, unspecified: Secondary | ICD-10-CM

## 2019-08-13 MED ORDER — ANORO ELLIPTA 62.5-25 MCG/INH IN AEPB: INHALATION_SPRAY | RESPIRATORY_TRACT | 5 refills | Status: DC

## 2019-08-13 MED ORDER — GABAPENTIN 300 MG PO CAPS: 300.0000 mg | ORAL_CAPSULE | Freq: Two times a day (BID) | ORAL | 1 refills | Status: DC

## 2019-08-13 MED ORDER — PANTOPRAZOLE SODIUM 40 MG PO TBEC: 40.0000 mg | DELAYED_RELEASE_TABLET | Freq: Every day | ORAL | 1 refills | Status: DC

## 2019-08-13 MED ORDER — ATORVASTATIN CALCIUM 40 MG PO TABS: 40.0000 mg | ORAL_TABLET | Freq: Every day | ORAL | 1 refills | Status: DC

## 2019-08-13 NOTE — Progress Notes (Signed)
Name: Kevin Roberts   MRN: [id number]    DOB: Mar 21, 1946   Date:08/13/2019       Progress Note  Subjective  Chief Complaint  Chief Complaint  Patient presents with  . COPD  . Hypertension  . Gastroesophageal Reflux  . Dyslipidemia    HPI  COPD Moderate: he is takingAnoro,he denies any cough, wheezing , he is not very active,states SOB only with moderate activity.He still usually smoking 1 pack daily. He is due for repeat CT lung this month.   History of Alcoholism: he used to drink before retirement - about 2 shots at night, however when he retired at age 70 he started to drink about 1/5 of liquor every two days. He states he stopped drinking because he fell on his face, developed a gastric ulcer and B12 deficiency. He is doing well since he stopped drinkingaround 2010.He still has tingling on his legs from neuropathy, taking gabapentin, symptoms not as intense and not as constant. He states he is able to button his own shirts now   GERD and history of gastric ulcer: He states symptoms controlled as long as he takes a PPI, no heartburn or regurgitation. He was seen by Dr. Fleet Contras and had EGD in August2019, large diverticulum but no signs of bleeding and has hiatal hernia small in size. He states he had symptoms three times in April, he states usually triggered by certain type of food and had to take Tums  Hyperlipidemia: taking Atorvastatin and denies side effects. Taking aspirin daily . Denies myalgia.We will recheck labs today  CAD: found on CT chest 3 vessel disease he denies chest pain ,it may be multifactorial from COPD also. He was seen by Dr. Fletcher Anon and advised to quit smoking. On aspirin, statins and beta-blocker. Cardiac cath in 2017 showed mild to moderate non-obstructive disease and is only on medical management. He saw Dr. Fletcher Anon in [id number]. He only took NTG once or twice last month   B12 neuropathy: he is taking B12 supplementation and also takes Neurontin,  he states tingling has improved but still present. Tips of fingers and also both legs and feet. He is doing better with function, able to button his own shirt   Tobacco Abuse: he has been a smoker for 44years, he is up to date with triple A screening, negative in 2016, chest CT done in 11/2013 . 06/2017, and last one 08/2018, already scheduled from 08/2019 . Explained importance of quitting smoking again, he states he used patches but could not stop. He is on statin therapy and baby aspirin, he has been smoking more lately , up to 1.5 pack daily   Atherosclerosis abdominal aorta: on medical management, aspirin,  and statin therapy.He does not want to quit smoking, he is actually smoking more up to 1.5 pack.   CKI: stage III,he avoiding NSAID's, no pruritis, good urine output ,we will recheck labs today   Renal Cyst: picked up on CT lung and had follow up renal US, benign cyst . Checked by Zara Council  Left gynecomastia: we did blood work in the fall that was normal.  No pain or discomfort , we will review next CT and consider referral to surgeon   Lesion supra pubic area: he states had it many years ago and it was removed, but over the past 5 months he noticed recurrence, it is pruriginous, no pain, no oozing or redness   Patient Active Problem List   Diagnosis Date Noted  . Hiatal hernia 01/22/2018  .  Abdominal pain, chronic, right lower quadrant 10/26/2017  . Esophageal diverticulum 10/26/2017  . History of alcoholism ([place]) 01/18/2016  . Gynecomastia 10/14/2015  . Abnormal nuclear stress test   . Anginal equivalent ([place])   . Atherosclerosis of aorta ([place]) 04/15/2015  . BPH with obstruction/lower urinary tract symptoms 02/03/2015  . Renal cyst, left 10/13/2014  . Tobacco use   . B12 neuropathy ([place]) 10/04/2014  . CAD, multiple vessel 10/04/2014  . Benign essential HTN 10/04/2014  . Chronic kidney disease (CKD), stage III (moderate) 10/04/2014  . Dyslipidemia 10/04/2014   . COPD, mild ([place]) 10/04/2014  . Gastro-esophageal reflux disease without esophagitis 10/04/2014  . Enlarged prostate 10/04/2014  . Vitamin D deficiency 10/04/2014    Past Surgical History:  Procedure Laterality Date  . CARDIAC CATHETERIZATION N/A 07/02/2015   Procedure: Left Heart Cath and Coronary Angiography;  Surgeon: Wellington Hampshire, MD;  Location: [facility];  Service: Cardiovascular;  Laterality: N/A;  . COLONOSCOPY  2011   JWB  . ESOPHAGOGASTRODUODENOSCOPY (EGD) WITH PROPOFOL N/A 11/14/2017   Procedure: ESOPHAGOGASTRODUODENOSCOPY (EGD) WITH PROPOFOL;  Surgeon: Robert Bellow, MD;  Location: [facility];  Service: Endoscopy;  Laterality: N/A;  . throat biopsy     lump removed    Family History  Problem Relation Age of Onset  . Hypertension Mother   . CVA Mother   . Cancer Father        Lung  . Mental illness Brother        1-Schizophrenia  . GER disease Son   . Healthy Sister   . Heart Problems Brother   . Gallbladder disease Brother   . Healthy Sister   . Gallbladder disease Brother   . Hypertension Brother     Social History   Socioeconomic History  . Marital status: Married    Spouse name: Claudine  . Number of children: 3  . Years of education: some college  . Highest education level: 12th grade  Occupational History  . Occupation: Retired  Tobacco Use  . Smoking status: Current Every Day Smoker    Packs/day: 1.50    Years: 44.00    Pack years: 66.00    Types: Cigarettes    Start date: 10/04/1973  . Smokeless tobacco: Never Used  Substance and Sexual Activity  . Alcohol use: No    Alcohol/week: 0.0 standard drinks  . Drug use: No  . Sexual activity: Not Currently    Partners: Female  Other Topics Concern  . Not on file  Social History Narrative  . Not on file   Social Determinants of Health   Financial Resource Strain: Low Risk   . Difficulty of Paying Living Expenses: Not hard at all  Food Insecurity: No Food Insecurity   . Worried About Charity fundraiser in the Last Year: Never true  . Ran Out of Food in the Last Year: Never true  Transportation Needs: No Transportation Needs  . Lack of Transportation (Medical): No  . Lack of Transportation (Non-Medical): No  Physical Activity: Inactive  . Days of Exercise per Week: 0 days  . Minutes of Exercise per Session: 0 min  Stress: No Stress Concern Present  . Feeling of Stress : Only a little  Social Connections: Somewhat Isolated  . Frequency of Communication with Friends and Family: More than three times a week  . Frequency of Social Gatherings with Friends and Family: More than three times a week  . Attends Religious Services: Never  . Active  Member of Clubs or Organizations: No  . Attends Archivist Meetings: Never  . Marital Status: Married      Current Outpatient Medications:  .  aspirin 81 MG tablet, Take 1 tablet by mouth daily., Disp: , Rfl:  .  atorvastatin (LIPITOR) 40 MG tablet, Take 1 tablet (40 mg total) by mouth daily., Disp: 90 tablet, Rfl: 1 .  Cyanocobalamin (B-12) 1000 MCG TBCR, Take 1 tablet by mouth daily., Disp: , Rfl:  .  gabapentin (NEURONTIN) 300 MG capsule, Take 1 capsule (300 mg total) by mouth 2 (two) times daily., Disp: 180 capsule, Rfl: 1 .  MULTIPLE VITAMIN PO, Take 1 tablet by mouth daily., Disp: , Rfl:  .  nitroGLYCERIN (NITROSTAT) 0.4 MG SL tablet, Place 1 tablet (0.4 mg total) under the tongue every 5 (five) minutes as needed for chest pain., Disp: 30 tablet, Rfl: 0 .  pantoprazole (PROTONIX) 40 MG tablet, Take 1 tablet (40 mg total) by mouth daily., Disp: 90 tablet, Rfl: 1 .  tamsulosin (FLOMAX) 0.4 MG CAPS capsule, Take 1 capsule (0.4 mg total) by mouth daily., Disp: 90 capsule, Rfl: 3 .  umeclidinium-vilanterol (ANORO ELLIPTA) 62.5-25 MCG/INH AEPB, INHALE 1 PUFF BY MOUTH INTO THE LUNGS DAILY, Disp: 60 each, Rfl: 5 .  Vitamin D, Cholecalciferol, 1000 UNITS CAPS, Take 1 tablet by mouth daily., Disp: , Rfl:    No Known Allergies  I personally reviewed active problem list, medication list, allergies, family history, social history, health maintenance with the patient/caregiver today.   ROS  Constitutional: Negative for fever , positive for mild  weight change.  Respiratory: Negative for cough , he has  shortness of breath with activity .   Cardiovascular: Negative for chest pain or palpitations.  Gastrointestinal: Negative for abdominal pain, no bowel changes.  Musculoskeletal: Negative for gait problem or joint swelling.  Skin: Negative for rash.  Neurological: Negative for dizziness or headache.  No other specific complaints in a complete review of systems (except as listed in HPI above).  Objective  Vitals:   08/13/19 0819  BP: 110/60  Pulse: 80  Resp: 16  Temp: (!) 97.3 F (36.3 C)  TempSrc: Temporal  SpO2: 98%  Weight: 164 lb 11.2 oz (74.7 kg)  Height: 5\' 11"  (1.803 m)    Body mass index is 22.97 kg/m.  Physical Exam  Constitutional: Patient appears well-developed and well-nourished.  No distress.  HEENT: head atraumatic, normocephalic, pupils equal and reactive to light Cardiovascular: Normal rate, regular rhythm and normal heart sounds.  No murmur heard. No BLE edema. Pulmonary/Chest: Effort normal and breath sounds normal. No respiratory distress. Abdominal: Soft.  There is no tenderness. Psychiatric: Patient has a normal mood and affect. behavior is normal. Judgment and thought content normal. Skin: thick areas of skin ( cheloid/cyst) on supra pubic area , non tender   Recent Results (from the past 2160 hour(s))  Novel Coronavirus, NAA ([facility])     Status: None   Collection Time: 05/23/19  2:00 PM   Specimen: Nasopharyngeal(NP) swabs in vial transport medium   NASOPHARYNGE  TESTING  Result Value Ref Range   SARS-CoV-2, NAA Not Detected Not Detected    Comment: This nucleic acid amplification test was developed and its performance characteristics determined  by [employer]. Nucleic acid amplification tests include RT-PCR and TMA. This test has not been FDA cleared or approved. This test has been authorized by FDA under an Emergency Use Authorization (EUA). This test is only authorized for the duration of  time the declaration that circumstances exist justifying the authorization of the emergency use of in vitro diagnostic tests for detection of SARS-CoV-2 virus and/or diagnosis of COVID-19 infection under section 564(b)(1) of the Act, 21 U.S.C. [id number]) (1), unless the authorization is terminated or revoked sooner. When diagnostic testing is negative, the possibility of a false negative result should be considered in the context of a patient's recent exposures and the presence of clinical signs and symptoms consistent with COVID-19. An individual without symptoms of COVID-19 and who is not shedding SARS-CoV-2 virus wo uld expect to have a negative (not detected) result in this assay.      PHQ2/9: Depression screen [facility] 2/9 08/13/2019 02/13/2019 10/10/2018 05/28/2018 01/22/2018  Decreased Interest 0 0 0 0 0  Down, Depressed, Hopeless 0 0 0 0 0  PHQ - 2 Score 0 0 0 0 0  Altered sleeping 0 0 0 0 -  Tired, decreased energy 0 0 0 0 -  Change in appetite 0 0 0 0 -  Feeling bad or failure about yourself  0 0 0 0 -  Trouble concentrating 0 0 0 0 -  Moving slowly or fidgety/restless 0 0 0 0 -  Suicidal thoughts 0 0 0 0 -  PHQ-9 Score 0 0 0 0 -  Difficult doing work/chores - Not difficult at all Not difficult at all Not difficult at all -    phq 9 is negative   Fall Risk: Fall Risk  08/13/2019 02/13/2019 10/10/2018 05/28/2018 01/22/2018  Falls in the past year? 0 0 0 0 No  Number falls in past yr: 0 0 0 - -  Injury with Fall? 1 0 0 - -  Risk for fall due to : - - - - -  Risk for fall due to: Comment - - - - -  Follow up - - Falls prevention discussed - -    Functional Status Survey: Is the patient deaf or have difficulty hearing?:  No Does the patient have difficulty seeing, even when wearing glasses/contacts?: No Does the patient have difficulty concentrating, remembering, or making decisions?: No Does the patient have difficulty walking or climbing stairs?: No Does the patient have difficulty dressing or bathing?: No Does the patient have difficulty doing errands alone such as visiting a doctor's office or shopping?: No    Assessment & Plan  1. Dyslipidemia  - Lipid panel - atorvastatin (LIPITOR) 40 MG tablet; Take 1 tablet (40 mg total) by mouth daily.  Dispense: 90 tablet; Refill: 1  2. COPD, moderate ([place])  - umeclidinium-vilanterol (ANORO ELLIPTA) 62.5-25 MCG/INH AEPB; INHALE 1 PUFF BY MOUTH INTO THE LUNGS DAILY  Dispense: 60 each; Refill: 5  3. Atherosclerosis of aorta (HCC)  On statin and aspirin   4. Stage 3a chronic kidney disease  - COMPLETE METABOLIC PANEL WITH GFR - CBC with Differential/Platelet - VITAMIN D 25 Hydroxy (Vit-D Deficiency, Fractures)  5. History of hypertension  - COMPLETE METABOLIC PANEL WITH GFR  6. History of alcoholism ([place])  - COMPLETE METABOLIC PANEL WITH GFR  7. CAD, multiple vessel  He has NTG at home   8. GERD without esophagitis  - pantoprazole (PROTONIX) 40 MG tablet; Take 1 tablet (40 mg total) by mouth daily.  Dispense: 90 tablet; Refill: 1  9. B12 neuropathy (HCC)  - Vitamin B12 - gabapentin (NEURONTIN) 300 MG capsule; Take 1 capsule (300 mg total) by mouth 2 (two) times daily.  Dispense: 180 capsule; Refill: 1  10. History of gastric ulcer  -  pantoprazole (PROTONIX) 40 MG tablet; Take 1 tablet (40 mg total) by mouth daily.  Dispense: 90 tablet; Refill: 1  11. Anginal equivalent ([place])  He has NTG at home   12. Skin cyst  - Ambulatory referral to General Surgery  13. Screen for colon cancer  - Ambulatory referral to General Surgery

## 2019-08-14 LAB — COMPLETE METABOLIC PANEL WITH GFR
AG Ratio: 1.8 (calc) (ref 1.0–2.5)
ALT: 17 U/L (ref 9–46)
AST: 22 U/L (ref 10–35)
Albumin: 4.4 g/dL (ref 3.6–5.1)
Alkaline phosphatase (APISO): 53 U/L (ref 35–144)
BUN/Creatinine Ratio: 5 (calc) — ABNORMAL LOW (ref 6–22)
BUN: 9 mg/dL (ref 7–25)
CO2: 30 mmol/L (ref 20–32)
Calcium: 9.9 mg/dL (ref 8.6–10.3)
Chloride: 105 mmol/L (ref 98–110)
Creat: 1.66 mg/dL — ABNORMAL HIGH (ref 0.70–1.18)
GFR, Est African American: 47 mL/min/{1.73_m2} — ABNORMAL LOW (ref >=60)
GFR, Est Non African American: 41 mL/min/{1.73_m2} — ABNORMAL LOW (ref >=60)
Globulin: 2.5 g/dL (calc) (ref 1.9–3.7)
Glucose, Bld: 97 mg/dL (ref 65–99)
Potassium: 4.2 mmol/L (ref 3.5–5.3)
Sodium: 140 mmol/L (ref 135–146)
Total Bilirubin: 1 mg/dL (ref 0.2–1.2)
Total Protein: 6.9 g/dL (ref 6.1–8.1)

## 2019-08-14 LAB — VITAMIN B12: Vitamin B-12: 1043 pg/mL (ref 200–1100)

## 2019-08-14 LAB — CBC WITH DIFFERENTIAL/PLATELET
Absolute Monocytes: 469 cells/uL (ref 200–950)
Basophils Absolute: 20 cells/uL (ref 0–200)
Basophils Relative: 0.3 %
Eosinophils Absolute: 68 cells/uL (ref 15–500)
Eosinophils Relative: 1 %
HCT: 46.2 % (ref 38.5–50.0)
Hemoglobin: 15.1 g/dL (ref 13.2–17.1)
Lymphs Abs: 1578 cells/uL (ref 850–3900)
MCH: 30.7 pg (ref 27.0–33.0)
MCHC: 32.7 g/dL (ref 32.0–36.0)
MCV: 93.9 fL (ref 80.0–100.0)
MPV: 12.6 fL — ABNORMAL HIGH (ref 7.5–12.5)
Monocytes Relative: 6.9 %
Neutro Abs: 4665 cells/uL (ref 1500–7800)
Neutrophils Relative %: 68.6 %
Platelets: 154 10*3/uL (ref 140–400)
RBC: 4.92 10*6/uL (ref 4.20–5.80)
RDW: 12.4 % (ref 11.0–15.0)
Total Lymphocyte: 23.2 %
WBC: 6.8 10*3/uL (ref 3.8–10.8)

## 2019-08-14 LAB — VITAMIN D 25 HYDROXY (VIT D DEFICIENCY, FRACTURES): Vit D, 25-Hydroxy: 41 ng/mL (ref 30–100)

## 2019-08-14 LAB — LIPID PANEL
Cholesterol: 102 mg/dL (ref <200)
HDL: 47 mg/dL (ref >=40)
LDL Cholesterol (Calc): 38 mg/dL (calc)
Non-HDL Cholesterol (Calc): 55 mg/dL (calc) (ref <130)
Total CHOL/HDL Ratio: 2.2 (calc) (ref <5.0)
Triglycerides: 86 mg/dL (ref <150)

## 2019-08-21 ENCOUNTER — Ambulatory Visit
Admission: RE | Admit: 2019-08-21 | Discharge: 2019-08-21 | Disposition: A | Discharge status: Home / Self Care | Payer: Medicare Other | Source: Ambulatory Visit | Attending: Oncology | Admitting: Oncology

## 2019-08-21 ENCOUNTER — Ambulatory Visit (INDEPENDENT_AMBULATORY_CARE_PROVIDER_SITE_OTHER): Payer: Medicare Other | Admitting: Surgery

## 2019-08-21 ENCOUNTER — Other Ambulatory Visit: Payer: Self-pay

## 2019-08-21 ENCOUNTER — Encounter: Payer: Self-pay | Admitting: Surgery

## 2019-08-21 VITALS — BP 152/69 | HR 48 | Temp 97.8°F | Resp 12 | Ht 71.0 in | Wt 161.6 lb

## 2019-08-21 DIAGNOSIS — R238 Other skin changes: Secondary | ICD-10-CM | POA: Diagnosis not present

## 2019-08-21 DIAGNOSIS — Z872 Personal history of diseases of the skin and subcutaneous tissue: Secondary | ICD-10-CM | POA: Diagnosis not present

## 2019-08-21 DIAGNOSIS — Z87891 Personal history of nicotine dependence: Secondary | ICD-10-CM

## 2019-08-21 DIAGNOSIS — F17210 Nicotine dependence, cigarettes, uncomplicated: Secondary | ICD-10-CM | POA: Diagnosis not present

## 2019-08-21 NOTE — Patient Instructions (Signed)
Follow up as needed. Call the office if you have any questions or concerns.  

## 2019-08-21 NOTE — Progress Notes (Signed)
Patient ID: Kevin Roberts, male   DOB: 17-Dec-1945, 74 y.o.   MRN: [id number]  Chief Complaint: Skin lesions of suprapubic area  History of Present Illness Kevin Roberts is a 74 y.o. male with referral for areas in the suprapubic area suspicious for cystic lesions..  Patient denies any redness or drainage.  He does report some degree of itching there.  He treats the areas with additional cleansing with alcohol.  He denies any prior history of inflammatory changes or drainage there.  He otherwise maintains good hygiene he is a well kempt gentleman.  Who presents with his wife of over 78 years.  Past Medical History Past Medical History:  Diagnosis Date  . Benign prostatic hypertrophy   . CAD (coronary atherosclerotic disease)   . Chronic kidney disease, stage III (moderate)   . COPD (chronic obstructive pulmonary disease) ([place])   . Cramps, muscle, general   . Elevated ferritin   . Emphysema of lung ([place])   . GERD (gastroesophageal reflux disease)   . History of hypercalcemia   . Hyperlipidemia   . Hypertension   . Incomplete bladder emptying   . Nocturia   . Peripheral polyneuropathy   . Rising PSA level   . Tobacco use   . Urgency of micturation   . Vitamin B 12 deficiency       Past Surgical History:  Procedure Laterality Date  . CARDIAC CATHETERIZATION N/A 07/02/2015   Procedure: Left Heart Cath and Coronary Angiography;  Surgeon: Wellington Hampshire, MD;  Location: [facility];  Service: Cardiovascular;  Laterality: N/A;  . COLONOSCOPY  2011   JWB  . ESOPHAGOGASTRODUODENOSCOPY (EGD) WITH PROPOFOL N/A 11/14/2017   Procedure: ESOPHAGOGASTRODUODENOSCOPY (EGD) WITH PROPOFOL;  Surgeon: Robert Bellow, MD;  Location: [facility];  Service: Endoscopy;  Laterality: N/A;  . throat biopsy     lump removed    No Known Allergies  Current Outpatient Medications  Medication Sig Dispense Refill  . aspirin 81 MG tablet Take 1 tablet by mouth daily.    Marland Kitchen atorvastatin  (LIPITOR) 40 MG tablet Take 1 tablet (40 mg total) by mouth daily. 90 tablet 1  . Cyanocobalamin (B-12) 1000 MCG TBCR Take 1 tablet by mouth daily.    Marland Kitchen gabapentin (NEURONTIN) 300 MG capsule Take 1 capsule (300 mg total) by mouth 2 (two) times daily. 180 capsule 1  . MULTIPLE VITAMIN PO Take 1 tablet by mouth daily.    . nitroGLYCERIN (NITROSTAT) 0.4 MG SL tablet Place 1 tablet (0.4 mg total) under the tongue every 5 (five) minutes as needed for chest pain. 30 tablet 0  . pantoprazole (PROTONIX) 40 MG tablet Take 1 tablet (40 mg total) by mouth daily. 90 tablet 1  . tamsulosin (FLOMAX) 0.4 MG CAPS capsule Take 1 capsule (0.4 mg total) by mouth daily. 90 capsule 3  . umeclidinium-vilanterol (ANORO ELLIPTA) 62.5-25 MCG/INH AEPB INHALE 1 PUFF BY MOUTH INTO THE LUNGS DAILY 60 each 5  . Vitamin D, Cholecalciferol, 1000 UNITS CAPS Take 1 tablet by mouth daily.     No current facility-administered medications for this visit.    Family History Family History  Problem Relation Age of Onset  . Hypertension Mother   . CVA Mother   . Cancer Father        Lung  . Mental illness Brother        1-Schizophrenia  . GER disease Son   . Healthy Sister   . Heart Problems Brother   .  Gallbladder disease Brother   . Healthy Sister   . Gallbladder disease Brother   . Hypertension Brother       Social History Social History   Tobacco Use  . Smoking status: Current Every Day Smoker    Packs/day: 1.50    Years: 44.00    Pack years: 66.00    Types: Cigarettes    Start date: 10/04/1973  . Smokeless tobacco: Never Used  Substance Use Topics  . Alcohol use: No    Alcohol/week: 0.0 standard drinks  . Drug use: No        Review of Systems  Constitutional: Negative for chills and fever.  HENT: Negative.   Eyes: Negative for blurred vision and pain.  Respiratory: Negative.   Cardiovascular: Negative.   Gastrointestinal: Positive for heartburn.  Genitourinary: Negative.   Musculoskeletal:  Negative.   Skin: Positive for itching.  Neurological: Negative.   Endo/Heme/Allergies: Negative.       Physical Exam Blood pressure (!) 152/69, pulse (!) 48, temperature 97.8 F (36.6 C), resp. rate 12, height 5\' 11"  (1.803 m), weight 161 lb 9.6 oz (73.3 kg), SpO2 97 %. Last Weight  Most recent update: 08/21/2019 11:54 AM   Weight  73.3 kg (161 lb 9.6 oz)            CONSTITUTIONAL: Well developed, and nourished, appropriately responsive and aware without distress.   EYES: Sclera non-icteric.   EARS, NOSE, MOUTH AND THROAT: Mask worn.     Hearing is intact to voice.  NECK: Trachea is midline, and there is no jugular venous distension.  RESPIRATORY:  Lungs are clear,  CARDIOVASCULAR: Heart is regular in rate and rhythm. GI: The abdomen is  soft, nontender, and nondistended.  MUSCULOSKELETAL:  Symmetrical muscle tone appreciated in all four extremities.    SKIN: Skin turgor is normal. No pathologic skin lesions appreciated.  There is what appears to be some scarring present in the immediate suprapubic area just above his penis and to the proximal right scrotum, with hair follicles in place.  No evidence of cavitation or purulent drainage.  No evidence of induration or active inflammation or erythema.  Essentially nontender to palpation.  No evidence of any erosion on the surface. NEUROLOGIC:  Motor and sensation appear grossly normal.  Cranial nerves are grossly without defect. PSYCH:  Alert and oriented to person, place and time. Affect is appropriate for situation.  Data Reviewed I have personally reviewed what is currently available of the patient's imaging, recent labs and medical records.   Labs:  CBC Latest Ref Rng & Units 08/13/2019 10/10/2018 06/26/2018  WBC 3.8 - 10.8 Thousand/uL 6.8 8.6 8.2  Hemoglobin 13.2 - 17.1 g/dL 15.1 14.4 14.8  Hematocrit 38.5 - 50.0 % 46.2 42.3 43.4  Platelets 140 - 400 Thousand/uL 154 169 150   CMP Latest Ref Rng & Units 08/13/2019 10/10/2018 05/28/2018   Glucose 65 - 99 mg/dL 97 93 89  BUN 7 - 25 mg/dL 9 18 16   Creatinine 0.70 - 1.18 mg/dL 1.66(H) 1.49(H) 1.46(H)  Sodium 135 - 146 mmol/L 140 141 140  Potassium 3.5 - 5.3 mmol/L 4.2 4.0 4.0  Chloride 98 - 110 mmol/L 105 107 105  CO2 20 - 32 mmol/L 30 26 31   Calcium 8.6 - 10.3 mg/dL 9.9 9.7 9.5  Total Protein 6.1 - 8.1 g/dL 6.9 6.5 6.7  Total Bilirubin 0.2 - 1.2 mg/dL 1.0 0.9 1.1  Alkaline Phos 40 - 115 U/L - - -  AST 10 - 35  U/L 22 30 19   ALT 9 - 46 U/L 17 19 13       Imaging:  Within last 24 hrs: No results found.  Assessment    Suspect healed appearance of suppurative hidradenitis of mild degree. Though his history he does not recall any particular drainage or inflammatory changes in this area.  As an African-American male it may be some degree of chronic ingrown hair follicles in this region of the suprapubic area. Patient Active Problem List   Diagnosis Date Noted  . Hiatal hernia 01/22/2018  . Abdominal pain, chronic, right lower quadrant 10/26/2017  . Esophageal diverticulum 10/26/2017  . History of alcoholism ([place]) 01/18/2016  . Gynecomastia 10/14/2015  . Abnormal nuclear stress test   . Anginal equivalent ([place])   . Atherosclerosis of aorta ([place]) 04/15/2015  . BPH with obstruction/lower urinary tract symptoms 02/03/2015  . Renal cyst, left 10/13/2014  . Tobacco use   . B12 neuropathy ([place]) 10/04/2014  . CAD, multiple vessel 10/04/2014  . Benign essential HTN 10/04/2014  . Chronic kidney disease (CKD), stage III (moderate) 10/04/2014  . Dyslipidemia 10/04/2014  . COPD, mild ([place]) 10/04/2014  . Gastro-esophageal reflux disease without esophagitis 10/04/2014  . Enlarged prostate 10/04/2014  . Vitamin D deficiency 10/04/2014    Plan    I discussed he continued utilizing the hygiene practices that have kept this from becoming a problem for him.  His biggest complaint seems to be just a mild degree of itching in this area.  His hygiene appears to be excellent and I  believe attempting to excise this area would only create further issues for him and I believe he appreciates that.  Face-to-face time spent with the patient and accompanying care providers(if present) was 15 minutes, with more than 50% of the time spent counseling, educating, and coordinating care of the patient.      Ronny Bacon M.D., FACS 08/21/2019, 12:25 PM

## 2019-08-25 ENCOUNTER — Telehealth: Payer: Self-pay | Admitting: Family Medicine

## 2019-08-25 NOTE — Chronic Care Management (AMB) (Signed)
  Chronic Care Management   Note  08/25/2019 Name: DEQUAN KINDRED MRN: [id number] DOB: 16-Jun-1945  Dorcas Carrow Gehlhausen is a 74 y.o. year old male who is a primary care patient of Steele Sizer, MD. I reached out to [employer] by phone today in response to a referral sent by Mr. Josephine Rudnick [place]'s health plan.     Mr. Juba was given information about Chronic Care Management services today including:  1. CCM service includes personalized support from designated clinical staff supervised by his physician, including individualized plan of care and coordination with other care providers 2. 24/7 contact phone numbers for assistance for urgent and routine care needs. 3. Service will only be billed when office clinical staff spend 20 minutes or more in a month to coordinate care. 4. Only one practitioner may furnish and bill the service in a calendar month. 5. The patient may stop CCM services at any time (effective at the end of the month) by phone call to the office staff. 6. The patient will be responsible for cost sharing (co-pay) of up to 20% of the service fee (after annual deductible is met).  Patient agreed to services and verbal consent obtained.   Follow up plan: Telephone appointment with care management team member scheduled for:09/01/2019  Noreene Larsson, [place], [place], [place] [address] Direct Dial: [phone] [email or web address]

## 2019-08-26 ENCOUNTER — Encounter: Payer: Self-pay | Admitting: *Deleted

## 2019-09-01 ENCOUNTER — Telehealth: Payer: Medicare Other

## 2019-09-01 ENCOUNTER — Ambulatory Visit: Payer: Self-pay

## 2019-09-01 NOTE — Chronic Care Management (AMB) (Signed)
  Chronic Care Management   Outreach Note  09/01/2019 Name: Kevin Roberts MRN: [id number] DOB: [birth year]  Primary Care Provider: Steele Sizer, MD Reason for referral : Chronic Care Management   An unsuccessful telephone outreach was attempted today. Mr. Sabath  was referred to the case management team for assistance with care management and care coordination.   A HIPAA compliant voice message was left today requesting a return call.   Follow Up Plan: The care management team will reach out to Mr. Mcelvain again within the next two weeks.   [facility] Care Management [phone]

## 2019-09-15 ENCOUNTER — Ambulatory Visit: Payer: Self-pay

## 2019-09-15 ENCOUNTER — Telehealth: Payer: Medicare Other

## 2019-09-15 NOTE — Chronic Care Management (AMB) (Signed)
  Chronic Care Management   Outreach Note  09/15/2019 Name: MATHAYUS STANBERY MRN: [id number] DOB: 04-10-1946   Primary Care Provider: Steele Sizer, MD Reason for referral : Chronic Care Management   An unsuccessful telephone outreach was attempted today. Mr. Eichenberger was referred to the case management team for assistance with care management and care coordination. Call was answered by a member in the home. Indicated that Mr. Kalan was not available at the time of the call.    Follow Up Plan: The care management team will reach out to Mr. Klarich again within the next two weeks.   [facility] Care Management [phone]

## 2019-09-26 ENCOUNTER — Telehealth: Payer: Medicare Other

## 2019-09-26 ENCOUNTER — Ambulatory Visit: Payer: Self-pay

## 2019-09-26 NOTE — Chronic Care Management (AMB) (Signed)
  Chronic Care Management   Outreach Note  09/26/2019 Name: Kevin Roberts MRN: [id number] DOB: 11/22/1945  Primary Care Provider: Steele Sizer, MD Reason for referral : Chronic Care Management   An unsuccessful telephone outreach was attempted today. [name] was referred to the case management team for assistance with care management and care coordination.   Phone range multiple times without option to leave a voice message.    Follow Up Plan: The care management team will reach out to [name] again within the next two to three weeks.   [facility] [phone]

## 2019-10-14 ENCOUNTER — Other Ambulatory Visit: Payer: Self-pay

## 2019-10-14 ENCOUNTER — Ambulatory Visit (INDEPENDENT_AMBULATORY_CARE_PROVIDER_SITE_OTHER): Payer: Medicare Other

## 2019-10-14 VITALS — BP 122/72 | HR 83 | Temp 96.8°F | Resp 16 | Ht 71.0 in | Wt 162.8 lb

## 2019-10-14 DIAGNOSIS — Z0000 Encounter for general adult medical examination without abnormal findings: Secondary | ICD-10-CM | POA: Diagnosis not present

## 2019-10-14 DIAGNOSIS — Z1211 Encounter for screening for malignant neoplasm of colon: Secondary | ICD-10-CM | POA: Diagnosis not present

## 2019-10-14 NOTE — Patient Instructions (Signed)
Kevin Roberts , Thank you for taking time to come for your Medicare Wellness Visit. I appreciate your ongoing commitment to your health goals. Please review the following plan we discussed and let me know if I can assist you in the future.   Screening recommendations/referrals: Colonoscopy: done 04/23/09. A referral has been sent to Dr. Bary Castilla for repeat screening colonoscopy. They will contact you for an appointment.  Recommended yearly ophthalmology/optometry visit for glaucoma screening and checkup Recommended yearly dental visit for hygiene and checkup  Vaccinations: Influenza vaccine: done 11/29/18 Pneumococcal vaccine: done 10/14/15 Tdap vaccine: DUE Shingles vaccine: Shingrix discussed. Please contact your pharmacy for coverage information.  Covid-19: done 06/05/19 & 07/02/19  Advanced directives: Advance directive discussed with you today. I have provided a copy for you to complete at home and have notarized. Once this is complete please bring a copy in to our office so we can scan it into your chart.  Conditions/risks identified: If you wish to quit smoking, help is available. For free tobacco cessation program offerings call the [facility] at ([phone] or Live Well Line at (475) 756-6553. You may also visit [email or web address] or email [email or web address] for more information on other programs.   Next appointment: Follow up in one year for your annual wellness visit.   Preventive Care 41 Years and Older, Male Preventive care refers to lifestyle choices and visits with your health care provider that can promote health and wellness. What does preventive care include?  A yearly physical exam. This is also called an annual well check.  Dental exams once or twice a year.  Routine eye exams. Ask your health care provider how often you should have your eyes checked.  Personal lifestyle choices, including:  Daily care of your teeth and gums.  Regular physical  activity.  Eating a healthy diet.  Avoiding tobacco and drug use.  Limiting alcohol use.  Practicing safe sex.  Taking low doses of aspirin every day.  Taking vitamin and mineral supplements as recommended by your health care provider. What happens during an annual well check? The services and screenings done by your health care provider during your annual well check will depend on your age, overall health, lifestyle risk factors, and family history of disease. Counseling  Your health care provider may ask you questions about your:  Alcohol use.  Tobacco use.  Drug use.  Emotional well-being.  Home and relationship well-being.  Sexual activity.  Eating habits.  History of falls.  Memory and ability to understand (cognition).  Work and work Statistician. Screening  You may have the following tests or measurements:  Height, weight, and BMI.  Blood pressure.  Lipid and cholesterol levels. These may be checked every 5 years, or more frequently if you are over 44 years old.  Skin check.  Lung cancer screening. You may have this screening every year starting at age 64 if you have a 30-pack-year history of smoking and currently smoke or have quit within the past 15 years.  Fecal occult blood test (FOBT) of the stool. You may have this test every year starting at age 7.  Flexible sigmoidoscopy or colonoscopy. You may have a sigmoidoscopy every 5 years or a colonoscopy every 10 years starting at age 43.  Prostate cancer screening. Recommendations will vary depending on your family history and other risks.  Hepatitis C blood test.  Hepatitis B blood test.  Sexually transmitted disease (STD) testing.  Diabetes screening. This is done by checking your blood  sugar (glucose) after you have not eaten for a while (fasting). You may have this done every 1-3 years.  Abdominal aortic aneurysm (AAA) screening. You may need this if you are a current or former  smoker.  Osteoporosis. You may be screened starting at age 79 if you are at high risk. Talk with your health care provider about your test results, treatment options, and if necessary, the need for more tests. Vaccines  Your health care provider may recommend certain vaccines, such as:  Influenza vaccine. This is recommended every year.  Tetanus, diphtheria, and acellular pertussis (Tdap, Td) vaccine. You may need a Td booster every 10 years.  Zoster vaccine. You may need this after age 56.  Pneumococcal 13-valent conjugate (PCV13) vaccine. One dose is recommended after age 60.  Pneumococcal polysaccharide (PPSV23) vaccine. One dose is recommended after age 57. Talk to your health care provider about which screenings and vaccines you need and how often you need them. This information is not intended to replace advice given to you by your health care provider. Make sure you discuss any questions you have with your health care provider. Document Released: 04/23/2015 Document Revised: 12/15/2015 Document Reviewed: 01/26/2015 Elsevier Interactive Patient Education  2017 [place] Prevention in the Home Falls can cause injuries. They can happen to people of all ages. There are many things you can do to make your home safe and to help prevent falls. What can I do on the outside of my home?  Regularly fix the edges of walkways and driveways and fix any cracks.  Remove anything that might make you trip as you walk through a door, such as a raised step or threshold.  Trim any bushes or trees on the path to your home.  Use bright outdoor lighting.  Clear any walking paths of anything that might make someone trip, such as rocks or tools.  Regularly check to see if handrails are loose or broken. Make sure that both sides of any steps have handrails.  Any raised decks and porches should have guardrails on the edges.  Have any leaves, snow, or ice cleared regularly.  Use sand or  salt on walking paths during winter.  Clean up any spills in your garage right away. This includes oil or grease spills. What can I do in the bathroom?  Use night lights.  Install grab bars by the toilet and in the tub and shower. Do not use towel bars as grab bars.  Use non-skid mats or decals in the tub or shower.  If you need to sit down in the shower, use a plastic, non-slip stool.  Keep the floor dry. Clean up any water that spills on the floor as soon as it happens.  Remove soap buildup in the tub or shower regularly.  Attach bath mats securely with double-sided non-slip rug tape.  Do not have throw rugs and other things on the floor that can make you trip. What can I do in the bedroom?  Use night lights.  Make sure that you have a light by your bed that is easy to reach.  Do not use any sheets or blankets that are too big for your bed. They should not hang down onto the floor.  Have a firm chair that has side arms. You can use this for support while you get dressed.  Do not have throw rugs and other things on the floor that can make you trip. What can I do in the  kitchen?  Clean up any spills right away.  Avoid walking on wet floors.  Keep items that you use a lot in easy-to-reach places.  If you need to reach something above you, use a strong step stool that has a grab bar.  Keep electrical cords out of the way.  Do not use floor polish or wax that makes floors slippery. If you must use wax, use non-skid floor wax.  Do not have throw rugs and other things on the floor that can make you trip. What can I do with my stairs?  Do not leave any items on the stairs.  Make sure that there are handrails on both sides of the stairs and use them. Fix handrails that are broken or loose. Make sure that handrails are as long as the stairways.  Check any carpeting to make sure that it is firmly attached to the stairs. Fix any carpet that is loose or worn.  Avoid having  throw rugs at the top or bottom of the stairs. If you do have throw rugs, attach them to the floor with carpet tape.  Make sure that you have a light switch at the top of the stairs and the bottom of the stairs. If you do not have them, ask someone to add them for you. What else can I do to help prevent falls?  Wear shoes that:  Do not have high heels.  Have rubber bottoms.  Are comfortable and fit you well.  Are closed at the toe. Do not wear sandals.  If you use a stepladder:  Make sure that it is fully opened. Do not climb a closed stepladder.  Make sure that both sides of the stepladder are locked into place.  Ask someone to hold it for you, if possible.  Clearly mark and make sure that you can see:  Any grab bars or handrails.  First and last steps.  Where the edge of each step is.  Use tools that help you move around (mobility aids) if they are needed. These include:  Canes.  Walkers.  Scooters.  Crutches.  Turn on the lights when you go into a dark area. Replace any light bulbs as soon as they burn out.  Set up your furniture so you have a clear path. Avoid moving your furniture around.  If any of your floors are uneven, fix them.  If there are any pets around you, be aware of where they are.  Review your medicines with your doctor. Some medicines can make you feel dizzy. This can increase your chance of falling. Ask your doctor what other things that you can do to help prevent falls. This information is not intended to replace advice given to you by your health care provider. Make sure you discuss any questions you have with your health care provider. Document Released: 01/21/2009 Document Revised: 09/02/2015 Document Reviewed: 05/01/2014 Elsevier Interactive Patient Education  2017 [employer].

## 2019-10-14 NOTE — Progress Notes (Signed)
Subjective:   Newton N Brumbach is a 74 y.o. male who presents for Medicare Annual/Subsequent preventive examination.  Review of Systems     Cardiac Risk Factors include: advanced age (>74men, >48 women);dyslipidemia;male gender;smoking/ tobacco exposure;hypertension     Objective:    Today's Vitals   10/14/19 0856  BP: 122/72  Pulse: 83  Resp: 16  Temp: (!) 96.8 F (36 C)  TempSrc: Temporal  SpO2: 99%  Weight: 162 lb 12.8 oz (73.8 kg)  Height: 5\' 11"  (1.803 m)   Body mass index is 22.71 kg/m.  Advanced Directives 10/14/2019 10/10/2018 11/14/2017 10/04/2017 09/20/2016 05/22/2016 01/18/2016  Does Patient Have a Medical Advance Directive? No No No No No No No  Would patient like information on creating a medical advance directive? Yes (MAU/Ambulatory/Procedural Areas - Information given) No - Patient declined No - Patient declined Yes (MAU/Ambulatory/Procedural Areas - Information given) - - No - patient declined information    Current Medications (verified) Outpatient Encounter Medications as of 10/14/2019  Medication Sig  . aspirin 81 MG tablet Take 1 tablet by mouth daily.  Marland Kitchen atorvastatin (LIPITOR) 40 MG tablet Take 1 tablet (40 mg total) by mouth daily.  . Cyanocobalamin (B-12) 1000 MCG TBCR Take 1 tablet by mouth daily.  Marland Kitchen gabapentin (NEURONTIN) 300 MG capsule Take 1 capsule (300 mg total) by mouth 2 (two) times daily.  . MULTIPLE VITAMIN PO Take 1 tablet by mouth daily.  . nitroGLYCERIN (NITROSTAT) 0.4 MG SL tablet Place 1 tablet (0.4 mg total) under the tongue every 5 (five) minutes as needed for chest pain.  . pantoprazole (PROTONIX) 40 MG tablet Take 1 tablet (40 mg total) by mouth daily.  . tamsulosin (FLOMAX) 0.4 MG CAPS capsule Take 1 capsule (0.4 mg total) by mouth daily.  Marland Kitchen umeclidinium-vilanterol (ANORO ELLIPTA) 62.5-25 MCG/INH AEPB INHALE 1 PUFF BY MOUTH INTO THE LUNGS DAILY  . Vitamin D, Cholecalciferol, 1000 UNITS CAPS Take 1 tablet by mouth daily.   No  facility-administered encounter medications on file as of 10/14/2019.    Allergies (verified) Patient has no known allergies.   History: Past Medical History:  Diagnosis Date  . Benign prostatic hypertrophy   . CAD (coronary atherosclerotic disease)   . Chronic kidney disease, stage III (moderate)   . COPD (chronic obstructive pulmonary disease) ([place])   . Cramps, muscle, general   . Elevated ferritin   . Emphysema of lung ([place])   . GERD (gastroesophageal reflux disease)   . History of hypercalcemia   . Hyperlipidemia   . Hypertension   . Incomplete bladder emptying   . Nocturia   . Peripheral polyneuropathy   . Rising PSA level   . Tobacco use   . Urgency of micturation   . Vitamin B 12 deficiency    Past Surgical History:  Procedure Laterality Date  . CARDIAC CATHETERIZATION N/A 07/02/2015   Procedure: Left Heart Cath and Coronary Angiography;  Surgeon: Wellington Hampshire, MD;  Location: [facility];  Service: Cardiovascular;  Laterality: N/A;  . COLONOSCOPY  2011   [facility]  . ESOPHAGOGASTRODUODENOSCOPY (EGD) WITH PROPOFOL N/A 11/14/2017   Procedure: ESOPHAGOGASTRODUODENOSCOPY (EGD) WITH PROPOFOL;  Surgeon: Robert Bellow, MD;  Location: [facility];  Service: Endoscopy;  Laterality: N/A;  . throat biopsy     lump removed   Family History  Problem Relation Age of Onset  . Hypertension Mother   . CVA Mother   . Cancer Father        Lung  .  Mental illness Brother        1-Schizophrenia  . GER disease Son   . Healthy Sister   . Heart Problems Brother   . Gallbladder disease Brother   . Healthy Sister   . Gallbladder disease Brother   . Hypertension Brother    Social History   Socioeconomic History  . Marital status: Married    Spouse name: Claudine  . Number of children: 3  . Years of education: some college  . Highest education level: 12th grade  Occupational History  . Occupation: Retired  Tobacco Use  . Smoking status: Current Every Day Smoker      Packs/day: 1.50    Years: 44.00    Pack years: 66.00    Types: Cigarettes    Start date: 10/04/1973  . Smokeless tobacco: Never Used  . Tobacco comment: [place] Smoking Cessation Information given   Vaping Use  . Vaping Use: Never used  Substance and Sexual Activity  . Alcohol use: No    Alcohol/week: 0.0 standard drinks  . Drug use: No  . Sexual activity: Not Currently    Partners: Female  Other Topics Concern  . Not on file  Social History Narrative  . Not on file   Social Determinants of Health   Financial Resource Strain: Low Risk   . Difficulty of Paying Living Expenses: Not hard at all  Food Insecurity: No Food Insecurity  . Worried About Charity fundraiser in the Last Year: Never true  . Ran Out of Food in the Last Year: Never true  Transportation Needs: No Transportation Needs  . Lack of Transportation (Medical): No  . Lack of Transportation (Non-Medical): No  Physical Activity: Inactive  . Days of Exercise per Week: 0 days  . Minutes of Exercise per Session: 0 min  Stress: No Stress Concern Present  . Feeling of Stress : Only a little  Social Connections: Moderately Isolated  . Frequency of Communication with Friends and Family: More than three times a week  . Frequency of Social Gatherings with Friends and Family: More than three times a week  . Attends Religious Services: Never  . Active Member of Clubs or Organizations: No  . Attends Archivist Meetings: Never  . Marital Status: Married    Tobacco Counseling Ready to quit: Yes Counseling given: Yes Comment: [place] Smoking Cessation Information given    Clinical Intake:  Pre-visit preparation completed: Yes  Pain : No/denies pain     BMI - recorded: 22.71 Nutritional Status: BMI of 19-24  Normal Nutritional Risks: None Diabetes: No  How often do you need to have someone help you when you read instructions, pamphlets, or other written materials from your doctor or  pharmacy?: 1 - Never    Interpreter Needed?: No  Information entered by :: Clemetine Marker LPN   Activities of Daily Living In your present state of health, do you have any difficulty performing the following activities: 10/14/2019 08/13/2019  Hearing? N N  Comment declines hearing aids -  Vision? N N  Difficulty concentrating or making decisions? N N  Walking or climbing stairs? N N  Dressing or bathing? N N  Doing errands, shopping? N N  Preparing Food and eating ? N -  Using the Toilet? N -  In the past six months, have you accidently leaked urine? N -  Do you have problems with loss of bowel control? N -  Managing your Medications? N -  Managing your  Finances? N -  Housekeeping or managing your Housekeeping? N -  Some recent data might be hidden    Patient Care Team: Steele Sizer, MD as PCP - General (Family Medicine) Wellington Hampshire, MD as Consulting Physician (Cardiology) Bary Castilla Forest Gleason, MD as Consulting Physician (General Surgery) Laneta Simmers as Physician Assistant (Urology) Neldon Labella, RN as Case Manager  Indicate any recent Medical Services you may have received from other than [facility] providers in the past year (date may be approximate).     Assessment:   This is a routine wellness examination for Edwards.  Hearing/Vision screen  Hearing Screening   125Hz  250Hz  500Hz  1000Hz  2000Hz  3000Hz  4000Hz  6000Hz  8000Hz   Right ear:           Left ear:           Comments: Pt denies hearing difficulty  Vision Screening Comments: [facility]  Dietary issues and exercise activities discussed: Current Exercise Habits: The patient does not participate in regular exercise at present, Exercise limited by: neurologic condition(s);respiratory conditions(s)  Goals    . DIET - INCREASE WATER INTAKE     Recommend to drink at least 6-8 8oz glasses of water per day.      Depression Screen PHQ 2/9 Scores 10/14/2019  08/13/2019 02/13/2019 10/10/2018 05/28/2018 01/22/2018 10/04/2017  PHQ - 2 Score 0 0 0 0 0 0 0  PHQ- 9 Score - 0 0 0 0 - 0    Fall Risk Fall Risk  10/14/2019 08/21/2019 08/13/2019 02/13/2019 10/10/2018  Falls in the past year? 0 0 0 0 0  Number falls in past yr: 0 0 0 0 0  Injury with Fall? 0 0 1 0 0  Risk for fall due to : No Fall Risks - - - -  Risk for fall due to: Comment - - - - -  Follow up Falls prevention discussed - - - Falls prevention discussed    Any stairs in or around the home? Yes  If so, are there any without handrails? Yes  Home free of loose throw rugs in walkways, pet beds, electrical cords, etc? Yes  Adequate lighting in your home to reduce risk of falls? Yes   ASSISTIVE DEVICES UTILIZED TO PREVENT FALLS:  Life alert? No  Use of a cane, walker or w/c? No  Grab bars in the bathroom? No  Shower chair or bench in shower? No  Elevated toilet seat or a handicapped toilet? No   TIMED UP AND GO:  Was the test performed? Yes .  Length of time to ambulate 10 feet: 5 sec.   Gait steady and fast without use of assistive device  Cognitive Function:     6CIT Screen 10/14/2019 10/10/2018 10/04/2017  What Year? 0 points 0 points 0 points  What month? 0 points 0 points 0 points  What time? 0 points 0 points 0 points  Count back from 20 0 points 0 points 0 points  Months in reverse 0 points 0 points 0 points  Repeat phrase 0 points 0 points 2 points  Total Score 0 0 2    Immunizations Immunization History  Administered Date(s) Administered  . Fluad Quad(high Dose 65+) 11/29/2018  . Influenza Split 01/13/2009  . Influenza, High Dose Seasonal PF 01/18/2016, 01/22/2017, 01/22/2018  . Influenza, Seasonal, Injecte, Preservative Fre 01/31/2011  . Influenza,inj,Quad PF,6+ Mos 02/21/2013, 03/02/2014, 11/25/2014  . Influenza-Unspecified 03/02/2014  . PFIZER SARS-COV-2 Vaccination 06/05/2019, 07/02/2019  . Pneumococcal  Conjugate-13 06/30/2014  . Pneumococcal Polysaccharide-23  06/14/2009, 10/14/2015  . Pneumococcal-Unspecified 06/14/2009  . Td 11/10/2008  . Tdap 11/10/2008  . Zoster 10/03/2011    TDAP status: Due, Education has been provided regarding the importance of this vaccine. Advised may receive this vaccine at local pharmacy or Health Dept. Aware to provide a copy of the vaccination record if obtained from local pharmacy or Health Dept. Verbalized acceptance and understanding.   Flu Vaccine status: Completed at today's visit   Pneumococcal vaccine status: Completed during today's visit.   Covid-19 vaccine status: Completed vaccines  Qualifies for Shingles Vaccine? Yes   Zostavax completed Yes   Shingrix Completed?: No.    Education has been provided regarding the importance of this vaccine. Patient has been advised to call insurance company to determine out of pocket expense if they have not yet received this vaccine. Advised may also receive vaccine at local pharmacy or Health Dept. Verbalized acceptance and understanding.  Screening Tests Health Maintenance  Topic Date Due  . COLONOSCOPY  04/24/2019  . TETANUS/TDAP  02/14/2020 (Originally 12/08/2018)  . INFLUENZA VACCINE  11/09/2019  . COVID-19 Vaccine  Completed  . Hepatitis C Screening  Completed  . PNA vac Low Risk Adult  Completed    Health Maintenance  Health Maintenance Due  Topic Date Due  . COLONOSCOPY  04/24/2019    Colorectal cancer screening: Completed 04/23/09. Repeat every 10 years. Referral sent to GI today. Pt aware they will contact him for appt.   Lung Cancer Screening: (Low Dose CT Chest recommended if Age 54-80 years, 30 pack-year currently smoking OR have quit w/in 15years.) does qualify. Completed 08/21/19.  Additional Screening:  Hepatitis C Screening: does qualify; Completed 02/06/12  Vision Screening: Recommended annual ophthalmology exams for early detection of glaucoma and other disorders of the eye. Is the patient up to date with their annual eye exam?  Yes    Who is the provider or what is the name of the office in which the patient attends annual eye exams? [facility] Screening: Recommended annual dental exams for proper oral hygiene  Community Resource Referral / Chronic Care Management: CRR required this visit?  No   CCM required this visit?  No      Plan:     I have personally reviewed and noted the following in the patient's chart:   . Medical and social history . Use of alcohol, tobacco or illicit drugs  . Current medications and supplements . Functional ability and status . Nutritional status . Physical activity . Advanced directives . List of other physicians . Hospitalizations, surgeries, and ER visits in previous 12 months . Vitals . Screenings to include cognitive, depression, and falls . Referrals and appointments  In addition, I have reviewed and discussed with patient certain preventive protocols, quality metrics, and best practice recommendations. A written personalized care plan for preventive services as well as general preventive health recommendations were provided to patient.     Clemetine Marker, LPN   04/15/1094   Nurse Notes: none

## 2019-10-17 ENCOUNTER — Telehealth: Payer: Medicare Other

## 2019-10-17 ENCOUNTER — Ambulatory Visit: Payer: Self-pay

## 2019-10-17 NOTE — Chronic Care Management (AMB) (Signed)
  Chronic Care Management   Outreach Note  10/17/2019 Name: Kevin Roberts MRN: [id number] DOB: [birth year]  Primary Care Provider: Steele Sizer, MD Reason for referral : Chronic Care Management   Kevin Roberts was referred to the care management team for assistance with chronic care management and care coordination. His primary care provider will be notified of our unsuccessful attempts to establish contact. The care management team will gladly outreach at any time in the future if he is interested in receiving assistance.  PLAN The care management team will gladly follow up with Kevin Roberts after the primary care provider has a conversation with him regarding recommendation for care management engagement and subsequent re-referral for care management services.   [facility] [phone]

## 2019-11-06 ENCOUNTER — Other Ambulatory Visit: Payer: Self-pay | Admitting: General Surgery

## 2019-11-06 DIAGNOSIS — Z1211 Encounter for screening for malignant neoplasm of colon: Secondary | ICD-10-CM | POA: Diagnosis not present

## 2019-11-24 ENCOUNTER — Other Ambulatory Visit
Admission: RE | Admit: 2019-11-24 | Discharge: 2019-11-24 | Disposition: A | Discharge status: Home / Self Care | Payer: Medicare Other | Source: Ambulatory Visit | Attending: General Surgery | Admitting: General Surgery

## 2019-11-24 ENCOUNTER — Other Ambulatory Visit: Payer: Self-pay

## 2019-11-24 DIAGNOSIS — Z20822 Contact with and (suspected) exposure to covid-19: Secondary | ICD-10-CM | POA: Insufficient documentation

## 2019-11-24 DIAGNOSIS — Z01812 Encounter for preprocedural laboratory examination: Secondary | ICD-10-CM | POA: Insufficient documentation

## 2019-11-24 LAB — SARS CORONAVIRUS 2 (TAT 6-24 HRS): SARS Coronavirus 2: NEGATIVE

## 2019-11-25 ENCOUNTER — Encounter: Payer: Self-pay | Admitting: General Surgery

## 2019-11-26 ENCOUNTER — Ambulatory Visit: Payer: Medicare Other | Admitting: Anesthesiology

## 2019-11-26 ENCOUNTER — Encounter
Admission: RE | Disposition: A | Discharge status: Home / Self Care | Payer: Self-pay | Source: Home / Self Care | Attending: General Surgery

## 2019-11-26 ENCOUNTER — Other Ambulatory Visit: Payer: Self-pay

## 2019-11-26 ENCOUNTER — Encounter: Payer: Self-pay | Admitting: General Surgery

## 2019-11-26 ENCOUNTER — Ambulatory Visit
Admission: RE | Admit: 2019-11-26 | Discharge: 2019-11-26 | Disposition: A | Discharge status: Home / Self Care | Payer: Medicare Other | Attending: General Surgery | Admitting: General Surgery

## 2019-11-26 DIAGNOSIS — I2510 Atherosclerotic heart disease of native coronary artery without angina pectoris: Secondary | ICD-10-CM | POA: Insufficient documentation

## 2019-11-26 DIAGNOSIS — E785 Hyperlipidemia, unspecified: Secondary | ICD-10-CM | POA: Insufficient documentation

## 2019-11-26 DIAGNOSIS — K635 Polyp of colon: Secondary | ICD-10-CM | POA: Diagnosis not present

## 2019-11-26 DIAGNOSIS — Z79899 Other long term (current) drug therapy: Secondary | ICD-10-CM | POA: Diagnosis not present

## 2019-11-26 DIAGNOSIS — Z1211 Encounter for screening for malignant neoplasm of colon: Secondary | ICD-10-CM | POA: Diagnosis not present

## 2019-11-26 DIAGNOSIS — G629 Polyneuropathy, unspecified: Secondary | ICD-10-CM | POA: Diagnosis not present

## 2019-11-26 DIAGNOSIS — F17200 Nicotine dependence, unspecified, uncomplicated: Secondary | ICD-10-CM | POA: Insufficient documentation

## 2019-11-26 DIAGNOSIS — D120 Benign neoplasm of cecum: Secondary | ICD-10-CM | POA: Diagnosis not present

## 2019-11-26 DIAGNOSIS — K5730 Diverticulosis of large intestine without perforation or abscess without bleeding: Secondary | ICD-10-CM | POA: Diagnosis not present

## 2019-11-26 DIAGNOSIS — Z7982 Long term (current) use of aspirin: Secondary | ICD-10-CM | POA: Insufficient documentation

## 2019-11-26 DIAGNOSIS — I129 Hypertensive chronic kidney disease with stage 1 through stage 4 chronic kidney disease, or unspecified chronic kidney disease: Secondary | ICD-10-CM | POA: Diagnosis not present

## 2019-11-26 DIAGNOSIS — J439 Emphysema, unspecified: Secondary | ICD-10-CM | POA: Insufficient documentation

## 2019-11-26 DIAGNOSIS — K219 Gastro-esophageal reflux disease without esophagitis: Secondary | ICD-10-CM | POA: Insufficient documentation

## 2019-11-26 DIAGNOSIS — N183 Chronic kidney disease, stage 3 unspecified: Secondary | ICD-10-CM | POA: Diagnosis not present

## 2019-11-26 DIAGNOSIS — E538 Deficiency of other specified B group vitamins: Secondary | ICD-10-CM | POA: Diagnosis not present

## 2019-11-26 DIAGNOSIS — Z8249 Family history of ischemic heart disease and other diseases of the circulatory system: Secondary | ICD-10-CM | POA: Insufficient documentation

## 2019-11-26 DIAGNOSIS — N400 Enlarged prostate without lower urinary tract symptoms: Secondary | ICD-10-CM | POA: Insufficient documentation

## 2019-11-26 HISTORY — PX: COLONOSCOPY WITH PROPOFOL: SHX5780

## 2019-11-26 LAB — HM COLONOSCOPY

## 2019-11-26 SURGERY — COLONOSCOPY WITH PROPOFOL
Anesthesia: General

## 2019-11-26 MED ORDER — PROPOFOL 500 MG/50ML IV EMUL
INTRAVENOUS | Status: DC | PRN
  Administered 2019-11-26: 100 ug/kg/min via INTRAVENOUS

## 2019-11-26 MED ORDER — GLYCOPYRROLATE 0.2 MG/ML IJ SOLN
INTRAMUSCULAR | Status: DC | PRN
Start: 2019-11-26 — End: 2019-11-26
  Administered 2019-11-26: .2 mg via INTRAVENOUS

## 2019-11-26 MED ORDER — SODIUM CHLORIDE (PF) 0.9 % IJ SOLN
INTRAMUSCULAR | Status: DC | PRN
  Administered 2019-11-26: 3 mL via INTRAVENOUS

## 2019-11-26 MED ORDER — LIDOCAINE HCL (CARDIAC) PF 100 MG/5ML IV SOSY
PREFILLED_SYRINGE | INTRAVENOUS | Status: DC | PRN
  Administered 2019-11-26: 80 mg via INTRAVENOUS

## 2019-11-26 MED ORDER — PROPOFOL 10 MG/ML IV BOLUS
INTRAVENOUS | Status: DC | PRN
  Administered 2019-11-26: 20 mg via INTRAVENOUS
  Administered 2019-11-26: 30 mg via INTRAVENOUS
  Administered 2019-11-26: 50 mg via INTRAVENOUS

## 2019-11-26 MED ORDER — SODIUM CHLORIDE 0.9 % IV SOLN
INTRAVENOUS | Status: DC
  Administered 2019-11-26: 1000 mL via INTRAVENOUS

## 2019-11-26 NOTE — Anesthesia Preprocedure Evaluation (Signed)
Anesthesia Evaluation  Patient identified by MRN, date of birth, ID band Patient awake    Reviewed: Allergy & Precautions, H&P , NPO status , reviewed documented beta blocker date and time   History of Anesthesia Complications Negative for: history of anesthetic complications  Airway Mallampati: II  TM Distance: >3 FB Neck ROM: full    Dental  (+) Upper Dentures, Partial Lower, Poor Dentition   Pulmonary neg shortness of breath, COPD, Current Smoker,    Pulmonary exam normal        Cardiovascular hypertension, + angina + CAD and + Peripheral Vascular Disease  Normal cardiovascular exam  06/2015 Cath  The left ventricular systolic function is normal.  Prox LAD lesion, 20% stenosed.  Mid LAD lesion, 40% stenosed.  Ost 2nd Diag to 2nd Diag lesion, 30% stenosed.  Ost 1st Diag to 1st Diag lesion, 20% stenosed.  Mid Cx lesion, 20% stenosed.  Prox RCA lesion, 20% stenosed.   1. Mild to moderate nonobstructive coronary artery disease. The coronary arteries are overall moderately to heavily calcified especially the LAD.  2. Normal LV systolic function and high normal left ventricular end-diastolic pressure.   Neuro/Psych  Neuromuscular disease    GI/Hepatic hiatal hernia, GERD  Medicated and Controlled,  Endo/Other    Renal/GU Renal disease     Musculoskeletal   Abdominal   Peds  Hematology   Anesthesia Other Findings Past Medical History: No date: Benign prostatic hypertrophy No date: CAD (coronary atherosclerotic disease) No date: Chronic kidney disease, stage III (moderate) (HCC) No date: COPD (chronic obstructive pulmonary disease) (HCC) No date: Cramps, muscle, general No date: Elevated ferritin No date: GERD (gastroesophageal reflux disease) No date: History of hypercalcemia No date: Hyperlipidemia No date: Hypertension No date: Incomplete bladder emptying No date: Nocturia No date: Peripheral  polyneuropathy No date: Rising PSA level No date: Tobacco use No date: Urgency of micturation No date: Vitamin B 12 deficiency  Past Surgical History: 07/02/2015: CARDIAC CATHETERIZATION; N/A     Comment:  Procedure: Left Heart Cath and Coronary Angiography;                Surgeon: Wellington Hampshire, MD;  Location: [facility]               CV LAB;  Service: Cardiovascular;  Laterality: N/A; 2011: COLONOSCOPY     Comment:  JWB No date: throat biopsy     Comment:  lump removed  BMI    Body Mass Index:  22.04 kg/m      Reproductive/Obstetrics                             Anesthesia Physical  Anesthesia Plan  ASA: III  Anesthesia Plan: General   Post-op Pain Management:    Induction: Intravenous  PONV Risk Score and Plan: 1 and Treatment may vary due to age or medical condition and TIVA  Airway Management Planned: Nasal Cannula and Natural Airway  Additional Equipment:   Intra-op Plan:   Post-operative Plan:   Informed Consent: I have reviewed the patients History and Physical, chart, labs and discussed the procedure including the risks, benefits and alternatives for the proposed anesthesia with the patient or authorized representative who has indicated his/her understanding and acceptance.     Dental Advisory Given  Plan Discussed with: CRNA  Anesthesia Plan Comments: (Patient consented for risks of anesthesia including but not limited to:  - adverse reactions to medications - risk  of intubation if required - damage to eyes, teeth, lips or other oral mucosa - nerve damage due to positioning  - sore throat or hoarseness - Damage to heart, brain, nerves, lungs, other parts of body or loss of life  Patient voiced understanding.)        Anesthesia Quick Evaluation

## 2019-11-26 NOTE — Op Note (Signed)
[facility] Gastroenterology Patient Name: Kevin Roberts Procedure Date: 11/26/2019 9:35 AM MRN: [id number] Account #: [id number] Date of Birth: November 10, 1945 Admit Type: Outpatient Age: 74 Room: [facility] ENDO ROOM 1 Gender: Male Note Status: Finalized Procedure:             Colonoscopy Indications:           Screening for colorectal malignant neoplasm Providers:             Robert Bellow, MD Medicines:             Monitored Anesthesia Care Complications:         No immediate complications. Procedure:             Pre-Anesthesia Assessment:                        - Prior to the procedure, a History and Physical was                         performed, and patient medications, allergies and                         sensitivities were reviewed. The patient's tolerance                         of previous anesthesia was reviewed.                        - The risks and benefits of the procedure and the                         sedation options and risks were discussed with the                         patient. All questions were answered and informed                         consent was obtained.                        After obtaining informed consent, the colonoscope was                         passed under direct vision. Throughout the procedure,                         the patient's blood pressure, pulse, and oxygen                         saturations were monitored continuously. The                         Colonoscope was introduced through the anus and                         advanced to the the cecum, identified by appendiceal                         orifice and ileocecal valve. The colonoscopy was  performed without difficulty. The patient tolerated                         the procedure well. The quality of the bowel                         preparation was excellent. Findings:      A 15 mm polyp was found in the cecum. The polyp was sessile. Area  was       successfully injected with 3 mL saline for a lift polypectomy. The polyp       was removed with a hot snare. Resection and retrieval were complete. To       prevent bleeding post-intervention, one hemostatic clip was successfully       placed (MR conditional). There was no bleeding during, or at the end, of       the procedure.      A few small-mouthed diverticula were found in the sigmoid colon.      The retroflexed view of the distal rectum and anal verge was normal and       showed no anal or rectal abnormalities. Impression:            - One 15 mm polyp in the cecum, removed with a hot                         snare. Resected and retrieved. Injected. Clip (MR                         conditional) was placed.                        - Diverticulosis in the sigmoid colon.                        - The distal rectum and anal verge are normal on                         retroflexion view. Recommendation:        - Telephone endoscopist for pathology results in 1                         week. Procedure Code(s):     --- Professional ---                        [phone], Colonoscopy, flexible; with removal of                         tumor(s), polyp(s), or other lesion(s) by snare                         technique                        [id number], Colonoscopy, flexible; with directed submucosal                         injection(s), any substance Diagnosis Code(s):     --- Professional ---  K57.30, Diverticulosis of large intestine without                         perforation or abscess without bleeding                        K63.5, Polyp of colon                        Z12.11, Encounter for screening for malignant neoplasm                         of colon CPT copyright 2019 American Medical Association. All rights reserved. The codes documented in this report are preliminary and upon coder review may  be revised to meet current compliance requirements. Robert Bellow,  MD 11/26/2019 10:17:16 AM This report has been signed electronically. Number of Addenda: 0 Note Initiated On: 11/26/2019 9:35 AM Scope Withdrawal Time: 0 hours 19 minutes 52 seconds  Total Procedure Duration: 0 hours 26 minutes 55 seconds  Estimated Blood Loss:  Estimated blood loss: none.      [facility]

## 2019-11-26 NOTE — Transfer of Care (Signed)
Immediate Anesthesia Transfer of Care Note  Patient: Kevin Roberts  Procedure(s) Performed: COLONOSCOPY WITH PROPOFOL (N/A )  Patient Location: PACU and Endoscopy Unit  Anesthesia Type:General  Level of Consciousness: awake, oriented, drowsy and patient cooperative  Airway & Oxygen Therapy: Patient Spontanous Breathing  Post-op Assessment: Report given to RN and Post -op Vital signs reviewed and stable  Post vital signs: Reviewed and stable  Last Vitals:  Vitals Value Taken Time  BP 145/75 11/26/19 1017  Temp    Pulse 86 11/26/19 1018  Resp 17 11/26/19 1018  SpO2 97 % 11/26/19 1018  Vitals shown include unvalidated device data.  Last Pain:  Vitals:   11/26/19 0912  TempSrc: Temporal  PainSc: 0-No pain         Complications: No complications documented.

## 2019-11-26 NOTE — H&P (Signed)
Patient ID: Kevin Roberts is a 74 y.o. male.  HPI  The following portions of the patient's history were reviewed and updated as appropriate.  This an established patient is here today for: office visit. Patient is here today to discuss having a colonoscopy. The patient reports his last colonoscopy was done in 2011. He reports his bowels move every 3-4 days. He denies using Miralax. Patient states his brother has to have surgery because they found something in his colon but he is unsure if it's cancer.   The patient reports that a younger brother is presently under evaluation for cancer at [facility]. By his description, this may represent a rectal cancer as radiation followed by surgery is being discussed. No confirmed history of colon cancer in the family.  Chief Complaint  Patient presents with  . Colonoscopy  discussion    BP 132/66  Pulse 74  Temp 36.8 C (98.3 F)  Ht 180.3 cm (5\' 11" )  Wt 73 kg (161 lb)  SpO2 95%  BMI 22.45 kg/m   Past Medical History:  Diagnosis Date  . BPH (benign prostatic hyperplasia)  . CAD (coronary artery disease)  . Chronic kidney disease (CKD), stage III (moderate) (CMS-HCC)  . COPD (chronic obstructive pulmonary disease) (CMS-HCC)  . Cramps, muscle, general  . Elevated ferritin  . Emphysema of lung (CMS-HCC)  . GERD (gastroesophageal reflux disease)  . History of hypercalcemia  . Hyperlipidemia  . Hypertension  . Incomplete bladder emptying  . Nocturia  . Peripheral polyneuropathy  . Rising PSA level  . Tobacco use  . Urgency of micturition  . Vitamin B 12 deficiency    Past Surgical History:  Procedure Laterality Date  . cardiac catherization 07/02/2015  . COLONOSCOPY 2011  . EGD 11/14/2017  . throat biopsy    Social History   Socioeconomic History  . Marital status: Married  Spouse name: Not on file  . Number of children: Not on file  . Years of education: Not on file  . Highest education level: Not on file  Occupational History   . Not on file  Tobacco Use  . Smoking status: Current Every Day Smoker  . Smokeless tobacco: Never Used  Substance and Sexual Activity  . Alcohol use: Not Currently  . Drug use: Never  . Sexual activity: Not on file  Other Topics Concern  . Not on file  Social History Narrative  . Not on file   Social Determinants of Health   Financial Resource Strain:  . Difficulty of Paying Living Expenses:  Food Insecurity:  . Worried About Charity fundraiser in the Last Year:  . Arboriculturist in the Last Year:  Transportation Needs:  . Film/video editor (Medical):  Marland Kitchen Lack of Transportation (Non-Medical):    No Known Allergies  Current Outpatient Medications  Medication Sig Dispense Refill  . ANORO ELLIPTA 62.5-25 mcg/actuation inhaler Inhale 1 inhalation into the lungs once daily  . aspirin 81 MG EC tablet Take 81 mg by mouth once daily  . atorvastatin (LIPITOR) 40 MG tablet Take 40 mg by mouth once daily  . cholecalciferol (VITAMIN D3) 1000 unit capsule Take 1,000 Units by mouth once daily  . cyanocobalamin, vitamin B-12, (VITAMIN B-12 ORAL) Take by mouth once daily  . gabapentin (NEURONTIN) 300 MG capsule Take 300 mg by mouth 2 (two) times daily  . multivitamin with minerals (MEN'S ONE DAILY ORAL) Take by mouth once daily  . nitroGLYcerin (NITROSTAT) 0.4 MG SL tablet Place  under the tongue every 5 (five) minutes as needed  . pantoprazole (PROTONIX) 40 MG DR tablet Take 40 mg by mouth once daily  . tamsulosin (FLOMAX) 0.4 mg capsule Take 0.4 mg by mouth once daily  . bisacodyL (DULCOLAX) 5 mg EC tablet Take two tablets morning and two tablets afternoon day prior to Miralax prep. 4 tablet 0  . polyethylene glycol (MIRALAX) powder One bottle for colonoscopy prep. Use as directed. 255 g 0   No current facility-administered medications for this visit.   Family History  Problem Relation Age of Onset  . High blood pressure (Hypertension) Mother  . Stroke Mother  . Lung cancer  Father  . Schizophrenia Brother  . Heart disease Brother  . Gallbladder disease Brother  . High blood pressure (Hypertension) Brother  . Other Son  GER disease  . Breast cancer Neg Hx  . Colon cancer Neg Hx     Review of Systems  Constitutional: Negative for chills and fever.  Respiratory: Negative for cough.    Objective:  Physical Exam Constitutional:  Appearance: Normal appearance.  Cardiovascular:  Rate and Rhythm: Normal rate and regular rhythm.  Pulses: Normal pulses.  Heart sounds: Normal heart sounds.  Pulmonary:  Effort: Pulmonary effort is normal.  Breath sounds: Normal breath sounds.  Neurological:  Mental Status: He is alert and oriented to person, place, and time.  Psychiatric:  Mood and Affect: Mood normal.  Behavior: Behavior normal.   Labs and Radiology:  The patient's April 23, 2009 colonoscopy report summary reported diverticulosis. No history of polyps.  Upper endoscopy November 14, 2017 for upper abdominal discomfort showed a longstanding distal esophageal diverticulum. Likely secondary to a prior esophageal ulcer in the distant past.  [place] from his October 25, 2017 office evaluation: EGD dated April 27, 2009 showed a large diverticulum of the distal esophagus without evidence of bleeding.  October 06, 2008 upper endoscopy had showed a cratered esophageal ulcer without bleeding 37 cm from the incisors. Moderate esophagitis was noted.   Assessment:   Candidate for screening colonoscopy.  Plan:   With the likely history of a younger brother with colon cancer, he will be best served with colonoscopy as a screening technique. The pros and cons of the examination were reviewed. He was instructed in regards to preparation by the clinical staff.  Patient to follow up as scheduled and is aware to call for any new issues or concerns.  Entered by Ledell Noss, CMA, acting as a scribe for Dr. Hervey Ard, MD.   The documentation recorded by the  scribe accurately reflects the service I personally performed and the decisions made by me.   Robert Bellow, MD FACS    No interval change in history or physical.  Tolerated prep well.

## 2019-11-26 NOTE — Anesthesia Postprocedure Evaluation (Signed)
Anesthesia Post Note  Patient: Kevin Roberts  Procedure(s) Performed: COLONOSCOPY WITH PROPOFOL (N/A )  Patient location during evaluation: Endoscopy Anesthesia Type: General Level of consciousness: awake and alert Pain management: pain level controlled Vital Signs Assessment: post-procedure vital signs reviewed and stable Respiratory status: spontaneous breathing, nonlabored ventilation, respiratory function stable and patient connected to nasal cannula oxygen Cardiovascular status: blood pressure returned to baseline and stable Postop Assessment: no apparent nausea or vomiting Anesthetic complications: no   No complications documented.   Last Vitals:  Vitals:   11/26/19 1030 11/26/19 1040  BP: (!) 144/96 (!) 154/92  Pulse: 87 71  Resp: 15 15  Temp:    SpO2: 98% 98%    Last Pain:  Vitals:   11/26/19 1010  TempSrc: Temporal  PainSc:                  Precious Haws Nastacia Raybuck

## 2019-11-27 ENCOUNTER — Encounter: Payer: Self-pay | Admitting: General Surgery

## 2019-11-27 LAB — SURGICAL PATHOLOGY

## 2020-01-16 DIAGNOSIS — H35033 Hypertensive retinopathy, bilateral: Secondary | ICD-10-CM | POA: Diagnosis not present

## 2020-01-21 ENCOUNTER — Other Ambulatory Visit: Payer: Self-pay | Admitting: Family Medicine

## 2020-01-21 DIAGNOSIS — N401 Enlarged prostate with lower urinary tract symptoms: Secondary | ICD-10-CM

## 2020-01-21 DIAGNOSIS — N138 Other obstructive and reflux uropathy: Secondary | ICD-10-CM

## 2020-01-23 ENCOUNTER — Other Ambulatory Visit: Payer: Self-pay

## 2020-01-23 ENCOUNTER — Other Ambulatory Visit: Payer: Medicare Other

## 2020-01-23 DIAGNOSIS — N401 Enlarged prostate with lower urinary tract symptoms: Secondary | ICD-10-CM | POA: Diagnosis not present

## 2020-01-23 DIAGNOSIS — N138 Other obstructive and reflux uropathy: Secondary | ICD-10-CM | POA: Diagnosis not present

## 2020-01-24 LAB — PSA: Prostate Specific Ag, Serum: 2.4 ng/mL (ref 0.0–4.0)

## 2020-01-29 NOTE — Progress Notes (Signed)
01/30/2020 9:08 AM   Kevin Roberts 08-18-1945 [id number]  Referring provider: Steele Sizer, MD [address] [place],  [place] [address]  Chief Complaint  Patient presents with  . Benign Prostatic Hypertrophy    HPI: Patient is a 74 year old male who presents today for his yearly office visit for BPH with LUTS managed with tamsulosin.  BPH WITH LUTS His IPSS score today is 14, which is moderate lower urinary tract symptomatology.  He is pleased with his quality life due to his urinary symptoms.  His previous IPSS score was 7/1.  His previous PVR is 27 mL.     He is currently taking tamsulosin 0.4 mg daily.    Patient denies any modifying or aggravating factors.  Patient denies any gross hematuria, dysuria or suprapubic/flank pain.  Patient denies any fevers, chills, nausea or vomiting.    His baby brother has been diagnosed with prostate cancer, he is in his late 18's.    UA is benign.     IPSS    Row Name 01/30/20 0800         International Prostate Symptom Score   How often have you had the sensation of not emptying your bladder? Less than half the time     How often have you had to urinate less than every two hours? Less than half the time     How often have you found you stopped and started again several times when you urinated? Less than half the time     How often have you found it difficult to postpone urination? Less than half the time     How often have you had a weak urinary stream? Less than half the time     How often have you had to strain to start urination? Less than half the time     How many times did you typically get up at night to urinate? 2 Times     Total IPSS Score 14       Quality of Life due to urinary symptoms   If you were to spend the rest of your life with your urinary condition just the way it is now how would you feel about that? Mostly Satisfied            Score:  1-7 Mild 8-19 Moderate 20-35 Severe   PMH: Past  Medical History:  Diagnosis Date  . Benign prostatic hypertrophy   . CAD (coronary atherosclerotic disease)   . Chronic kidney disease, stage III (moderate)   . COPD (chronic obstructive pulmonary disease) ([place])   . Cramps, muscle, general   . Elevated ferritin   . Emphysema of lung ([place])   . GERD (gastroesophageal reflux disease)   . History of hypercalcemia   . Hyperlipidemia   . Hypertension   . Incomplete bladder emptying   . Nocturia   . Peripheral polyneuropathy   . Rising PSA level   . Tobacco use   . Urgency of micturation   . Vitamin B 12 deficiency     Surgical History: Past Surgical History:  Procedure Laterality Date  . CARDIAC CATHETERIZATION N/A 07/02/2015   Procedure: Left Heart Cath and Coronary Angiography;  Surgeon: Wellington Hampshire, MD;  Location: [place] CV LAB;  Service: Cardiovascular;  Laterality: N/A;  . COLONOSCOPY  2011   [place]  . COLONOSCOPY WITH PROPOFOL N/A 11/26/2019   Procedure: COLONOSCOPY WITH PROPOFOL;  Surgeon: Robert Bellow, MD;  Location: [facility] ENDOSCOPY;  Service:  Endoscopy;  Laterality: N/A;  . ESOPHAGOGASTRODUODENOSCOPY (EGD) WITH PROPOFOL N/A 11/14/2017   Procedure: ESOPHAGOGASTRODUODENOSCOPY (EGD) WITH PROPOFOL;  Surgeon: Robert Bellow, MD;  Location: [facility] ENDOSCOPY;  Service: Endoscopy;  Laterality: N/A;  . throat biopsy     lump removed    Home Medications:  Allergies as of 01/30/2020   No Known Allergies     Medication List       Accurate as of January 30, 2020  9:08 AM. If you have any questions, ask your nurse or doctor.        Anoro Ellipta 62.5-25 MCG/INH Aepb Generic drug: umeclidinium-vilanterol INHALE 1 PUFF BY MOUTH INTO THE LUNGS DAILY   aspirin 81 MG tablet Take 1 tablet by mouth daily.   atorvastatin 40 MG tablet Commonly known as: LIPITOR Take 1 tablet (40 mg total) by mouth daily.   B-12 1000 MCG Tbcr Take 1 tablet by mouth daily.   bisacodyl 5 MG EC tablet Commonly known as:  DULCOLAX Take two tablets morning and two tablets afternoon day prior to Miralax prep.   gabapentin 300 MG capsule Commonly known as: NEURONTIN Take 1 capsule (300 mg total) by mouth 2 (two) times daily.   MULTIPLE VITAMIN PO Take 1 tablet by mouth daily.   nitroGLYCERIN 0.4 MG SL tablet Commonly known as: NITROSTAT Place 1 tablet (0.4 mg total) under the tongue every 5 (five) minutes as needed for chest pain.   pantoprazole 40 MG tablet Commonly known as: PROTONIX Take 1 tablet (40 mg total) by mouth daily.   polyethylene glycol powder 17 GM/SCOOP powder Commonly known as: GLYCOLAX/MIRALAX Take by mouth as directed.   tamsulosin 0.4 MG Caps capsule Commonly known as: FLOMAX Take 1 capsule (0.4 mg total) by mouth daily.   Vitamin D (Cholecalciferol) 25 MCG (1000 UT) Caps Take 1 tablet by mouth daily.       Allergies: No Known Allergies  Family History: Family History  Problem Relation Age of Onset  . Hypertension Mother   . CVA Mother   . Cancer Father        Lung  . Mental illness Brother        1-Schizophrenia  . GER disease Son   . Healthy Sister   . Heart Problems Brother   . Gallbladder disease Brother   . Healthy Sister   . Gallbladder disease Brother   . Hypertension Brother     Social History:  reports that he has been smoking cigarettes. He started smoking about 46 years ago. He has a 66.00 pack-year smoking history. He has never used smokeless tobacco. He reports that he does not drink alcohol and does not use drugs.  ROS: For pertinent review of systems please refer to history of present illness  Physical Exam: BP (!) 149/92   Pulse 73   Ht 5\' 11"  (1.803 m)   Wt 163 lb (73.9 kg)   BMI 22.73 kg/m   Constitutional:  Well nourished. Alert and oriented, No acute distress. HEENT: [place] AT, mask in place.  Trachea midline Cardiovascular: No clubbing, cyanosis, or edema. Respiratory: Normal respiratory effort, no increased work of breathing. GU: No  CVA tenderness.  No bladder fullness or masses.  Non infected occlusive sebaceous cyst in the pubic area.  Patient with circumcised phallus.   Urethral meatus is patent.  No penile discharge. No penile lesions or rashes. Scrotum without lesions, cysts, rashes and/or edema.  Testicles are located scrotally bilaterally. No masses are appreciated in the testicles. Left and  right epididymis are normal. Rectal: Patient with  normal sphincter tone. Anus and perineum without scarring or rashes. No rectal masses are appreciated. Prostate is approximately 45 grams, no nodules are appreciated. Seminal vesicles could not be palpated Skin: No rashes, bruises or suspicious lesions. Lymph: No inguinal adenopathy. Neurologic: Grossly intact, no focal deficits, moving all 4 extremities. Psychiatric: Normal mood and affect.  Laboratory Data: Lab Results  Component Value Date   CREATININE 1.66 (H) 08/13/2019   Lab Results  Component Value Date   PSA 2.0 09/23/2013   PSA history:  1.8 ng/mL on 02/02/2014  Component     Latest Ref Rng & Units 02/03/2015 01/27/2016 01/25/2017 01/30/2018  Prostate Specific Ag, Serum     0.0 - 4.0 ng/mL 1.9 2.1 1.6 1.9   Component     Latest Ref Rng & Units 01/23/2019 01/23/2020  Prostate Specific Ag, Serum     0.0 - 4.0 ng/mL 2.0 2.4    Assessment and Plan  1. BPH with LUTS IPSS score is 14/2, it is worse Continue conservative management, avoiding bladder irritants and timed voiding's Continue tamsulosin 0.4 mg daily - refills given RTC in 12 months for IPSS, PSA and exam   2.  Occluded sebaceous cyst Recommended to use Cetaphil body/face wash   Return in about 1 year (around 01/29/2021) for IPSS, PSA and exam.  Zara Council, [facility] [address]  [place], [place] [address] [phone]

## 2020-01-30 ENCOUNTER — Ambulatory Visit (INDEPENDENT_AMBULATORY_CARE_PROVIDER_SITE_OTHER): Payer: Medicare Other | Admitting: Urology

## 2020-01-30 ENCOUNTER — Other Ambulatory Visit: Payer: Self-pay

## 2020-01-30 ENCOUNTER — Encounter: Payer: Self-pay | Admitting: Urology

## 2020-01-30 VITALS — BP 149/92 | HR 73 | Ht 71.0 in | Wt 163.0 lb

## 2020-01-30 DIAGNOSIS — R350 Frequency of micturition: Secondary | ICD-10-CM | POA: Diagnosis not present

## 2020-01-30 DIAGNOSIS — I2510 Atherosclerotic heart disease of native coronary artery without angina pectoris: Secondary | ICD-10-CM | POA: Diagnosis not present

## 2020-01-30 DIAGNOSIS — N138 Other obstructive and reflux uropathy: Secondary | ICD-10-CM

## 2020-01-30 DIAGNOSIS — N401 Enlarged prostate with lower urinary tract symptoms: Secondary | ICD-10-CM

## 2020-01-30 DIAGNOSIS — L723 Sebaceous cyst: Secondary | ICD-10-CM | POA: Diagnosis not present

## 2020-01-30 MED ORDER — TAMSULOSIN HCL 0.4 MG PO CAPS: 0.4000 mg | ORAL_CAPSULE | Freq: Every day | ORAL | 3 refills | Status: DC

## 2020-01-30 NOTE — Patient Instructions (Signed)
Cetaphil body/face wash for oily/acne skin

## 2020-01-31 ENCOUNTER — Other Ambulatory Visit: Payer: Self-pay | Admitting: Family Medicine

## 2020-01-31 DIAGNOSIS — E538 Deficiency of other specified B group vitamins: Secondary | ICD-10-CM

## 2020-01-31 DIAGNOSIS — G63 Polyneuropathy in diseases classified elsewhere: Secondary | ICD-10-CM

## 2020-01-31 LAB — URINALYSIS, COMPLETE
Bilirubin, UA: NEGATIVE
Glucose, UA: NEGATIVE
Ketones, UA: NEGATIVE
Leukocytes,UA: NEGATIVE
Nitrite, UA: NEGATIVE
Protein,UA: NEGATIVE
RBC, UA: NEGATIVE
Specific Gravity, UA: 1.02 (ref 1.005–1.030)
Urobilinogen, Ur: 0.2 mg/dL (ref 0.2–1.0)
pH, UA: 7 (ref 5.0–7.5)

## 2020-01-31 LAB — MICROSCOPIC EXAMINATION: Bacteria, UA: NONE SEEN

## 2020-02-06 ENCOUNTER — Other Ambulatory Visit: Payer: Self-pay | Admitting: Family Medicine

## 2020-02-06 DIAGNOSIS — E785 Hyperlipidemia, unspecified: Secondary | ICD-10-CM

## 2020-02-16 NOTE — Progress Notes (Signed)
Name: Kevin Roberts   MRN: [id number]    DOB: 01-15-1946   Date:02/17/2020       Progress Note  Subjective  Chief Complaint  Follow up   HPI   COPD Moderate: he is takingAnoro,he denies any cough, wheezing , he is not very active,states SOB only with moderate activity.He still usually smoking 1.5 pack daily, he was smoking 1 pack daily but has been smoking more since pandemic . Last CT lung was done 08/2019, no nodules, centrilobular and paraseptal emphysema, scattered areas of bronchiectasis. Discussed ways to help him cut down on amount he smokes   History of Alcoholism: he used to drink before retirement - about 2 shots at night, however when he retired at age 4 he started to drink about 1/5 of liquor every two days. He states he stopped drinking because he fell on his face, developed a gastric ulcer and B12 deficiency. He is doing well since he stopped drinkingaround 2010.He still has tingling on his legs from neuropathy, taking gabapentin, symptoms not as intense and not as constant. Unchanged   GERD and history of gastric ulcer:/hiatal hernia  He states symptoms controlled as long as he takes a PPI, no heartburn or regurgitation. He was seen by Dr. Fleet Contras and had EGD in August2019, large diverticulum but no signs of bleeding and has hiatal hernia small to moderate in size . Episodes of GERD a couple of times a month, usually at night , discussed taking Tums or Pepcid   Hyperlipidemia: taking Atorvastatin and denies side effects. Taking aspirin daily . Denies myalgia.Reviewed labs , LDL at goal   CAD/Angina : found on CT chest 3 vessel disease he denies chest pain ,it may be multifactorial from COPD also. He was seen by Dr. Fletcher Anon and advised to quit smoking. On aspirin, statins and beta-blocker. Cardiac cath in 2017 showed mild to moderate non-obstructive disease and is only on medical management. He saw Dr. Fletcher Anon in [id number]. He has used NTG twice in the past 6 months , he  had two episodes of chest tightness and symptoms resolved with medication  B12 neuropathy: he is taking B12 supplementation and also takes Neurontin, he states tingling has improved but still present. Tips of fingers and also both legs and feet. He is doing better with function, able to button his own shirt . Continue supplementation   Tobacco Abuse: he has been a smoker for 44years, he is up to date with triple A screening, negative in 2016, chest CT done in 11/2013 . 06/2017, and last one 08/2018, already scheduled from 08/2019 . Explained importance of quitting smoking again, he states he used nicotine patches in the past, he states it helped temporarily, but when he skipped doses he smoked more  He is on statin therapy and baby aspirin, he has been smoking more lately , up to 1.5 pack daily   Atherosclerosis abdominal aorta: on medical management, aspirin,  and statin therapy.He wants to quit smoking, but not sure how to do it. He has the resources for tobacco quit line   CKI: stage III,he avoiding NSAID's, no pruritis, good urine output ,we will recheck labs  Renal Cyst: picked up on CT lung and had follow up renal US, benign cyst . Checked by Zara Council  Left gynecomastia: we did blood work in the fall that was normal.  No pain or discomfort , CT did not show anything   Skin lesion: seen by surgeon, possible healed suppurative hydradenitis, seen by Urologist and  advised a lotion  Patient Active Problem List   Diagnosis Date Noted  . Hiatal hernia 01/22/2018  . Abdominal pain, chronic, right lower quadrant 10/26/2017  . Esophageal diverticulum 10/26/2017  . History of alcoholism ([place]) 01/18/2016  . Gynecomastia 10/14/2015  . Abnormal nuclear stress test   . Anginal equivalent ([place])   . Atherosclerosis of aorta ([place]) 04/15/2015  . BPH with obstruction/lower urinary tract symptoms 02/03/2015  . Renal cyst, left 10/13/2014  . Tobacco use   . B12 neuropathy ([place])  10/04/2014  . CAD, multiple vessel 10/04/2014  . Benign essential HTN 10/04/2014  . Chronic kidney disease (CKD), stage III (moderate) ([place]) 10/04/2014  . Dyslipidemia 10/04/2014  . COPD, mild ([place]) 10/04/2014  . Gastro-esophageal reflux disease without esophagitis 10/04/2014  . Enlarged prostate 10/04/2014  . Vitamin D deficiency 10/04/2014    Past Surgical History:  Procedure Laterality Date  . CARDIAC CATHETERIZATION N/A 07/02/2015   Procedure: Left Heart Cath and Coronary Angiography;  Surgeon: Wellington Hampshire, MD;  Location: [facility];  Service: Cardiovascular;  Laterality: N/A;  . COLONOSCOPY  2011   [place]  . COLONOSCOPY WITH PROPOFOL N/A 11/26/2019   Procedure: COLONOSCOPY WITH PROPOFOL;  Surgeon: Robert Bellow, MD;  Location: [facility];  Service: Endoscopy;  Laterality: N/A;  . ESOPHAGOGASTRODUODENOSCOPY (EGD) WITH PROPOFOL N/A 11/14/2017   Procedure: ESOPHAGOGASTRODUODENOSCOPY (EGD) WITH PROPOFOL;  Surgeon: Robert Bellow, MD;  Location: [facility];  Service: Endoscopy;  Laterality: N/A;  . throat biopsy     lump removed    Family History  Problem Relation Age of Onset  . Hypertension Mother   . CVA Mother   . Cancer Father        Lung  . Mental illness Brother        1-Schizophrenia  . GER disease Son   . Healthy Sister   . Heart Problems Brother   . Gallbladder disease Brother   . Healthy Sister   . Gallbladder disease Brother   . Hypertension Brother     Social History   Tobacco Use  . Smoking status: Current Every Day Smoker    Packs/day: 1.50    Years: 44.00    Pack years: 66.00    Types: Cigarettes    Start date: 10/04/1973  . Smokeless tobacco: Never Used  . Tobacco comment: [place] Smoking Cessation Information given   Substance Use Topics  . Alcohol use: No    Alcohol/week: 0.0 standard drinks     Current Outpatient Medications:  .  aspirin 81 MG tablet, Take 1 tablet by mouth daily., Disp: , Rfl:  .   atorvastatin (LIPITOR) 40 MG tablet, TAKE 1 TABLET(40 MG) BY MOUTH DAILY, Disp: 90 tablet, Rfl: 1 .  cholecalciferol (VITAMIN D) 25 MCG (1000 UNIT) tablet, Take by mouth., Disp: , Rfl:  .  Cyanocobalamin (B-12) 1000 MCG TBCR, Take 1 tablet by mouth daily., Disp: , Rfl:  .  gabapentin (NEURONTIN) 300 MG capsule, TAKE 1 CAPSULE(300 MG) BY MOUTH TWICE DAILY, Disp: 180 capsule, Rfl: 1 .  MULTIPLE VITAMIN PO, Take 1 tablet by mouth daily., Disp: , Rfl:  .  nitroGLYCERIN (NITROSTAT) 0.4 MG SL tablet, Place 1 tablet (0.4 mg total) under the tongue every 5 (five) minutes as needed for chest pain., Disp: 30 tablet, Rfl: 0 .  pantoprazole (PROTONIX) 40 MG tablet, Take 1 tablet (40 mg total) by mouth daily., Disp: 90 tablet, Rfl: 1 .  tamsulosin (FLOMAX) 0.4 MG CAPS capsule, Take 1  capsule (0.4 mg total) by mouth daily., Disp: 90 capsule, Rfl: 3 .  umeclidinium-vilanterol (ANORO ELLIPTA) 62.5-25 MCG/INH AEPB, INHALE 1 PUFF BY MOUTH INTO THE LUNGS DAILY, Disp: 60 each, Rfl: 5 .  Vitamin D, Cholecalciferol, 1000 UNITS CAPS, Take 1 tablet by mouth daily., Disp: , Rfl:   No Known Allergies  I personally reviewed active problem list, medication list, allergies, family history, social history, health maintenance with the patient/caregiver today.   ROS  Constitutional: Negative for fever or weight change.  Respiratory: Negative for cough , he has intermittent  shortness of breath.   Cardiovascular: Patient denies recent  chest pain but no palpitations.  Gastrointestinal: Negative for abdominal pain, no bowel changes.  Musculoskeletal: Negative for gait problem or joint swelling.  Skin: Negative for rash.  Neurological: Negative for dizziness or headache.  No other specific complaints in a complete review of systems (except as listed in HPI above).  Objective  Vitals:   02/17/20 0759  BP: 128/60  Pulse: 76  Resp: 16  Temp: 98.2 F (36.8 C)  TempSrc: Oral  SpO2: 98%  Weight: 162 lb 4.8 oz (73.6 kg)   Height: 5\' 11"  (1.803 m)    Body mass index is 22.64 kg/m.  Physical Exam  Constitutional: Patient appears well-developed and well-nourished. No distress.  HEENT: head atraumatic, normocephalic, pupils equal and reactive to light,  neck supple Cardiovascular: Normal rate, regular rhythm and normal heart sounds.  No murmur heard. No BLE edema. Pulmonary/Chest: Effort normal and breath sounds normal. No respiratory distress. Abdominal: Soft.  There is no tenderness. Psychiatric: Patient has a normal mood and affect. behavior is normal. Judgment and thought content normal.  Recent Results (from the past 2160 hour(s))  SARS CORONAVIRUS 2 (TAT 6-24 HRS) Nasopharyngeal Nasopharyngeal Swab     Status: None   Collection Time: 11/24/19 11:23 AM   Specimen: Nasopharyngeal Swab  Result Value Ref Range   SARS Coronavirus 2 NEGATIVE NEGATIVE    Comment: (NOTE) SARS-CoV-2 target nucleic acids are NOT DETECTED.  The SARS-CoV-2 RNA is generally detectable in upper and lower respiratory specimens during the acute phase of infection. Negative results do not preclude SARS-CoV-2 infection, do not rule out co-infections with other pathogens, and should not be used as the sole basis for treatment or other patient management decisions. Negative results must be combined with clinical observations, patient history, and epidemiological information. The expected result is Negative.  Fact Sheet for Patients: SugarRoll.be  Fact Sheet for Healthcare Providers: https://www.woods-mathews.com/  This test is not yet approved or cleared by the [place] FDA and  has been authorized for detection and/or diagnosis of SARS-CoV-2 by FDA under an Emergency Use Authorization (EUA). This EUA will remain  in effect (meaning this test can be used) for the duration of the COVID-19 declaration under Se ction 564(b)(1) of the Act, 21 U.S.C. section 360bbb-3(b)(1), unless  the authorization is terminated or revoked sooner.  Performed at [facility], [place] [address], [place], [place] [address]   HM COLONOSCOPY     Status: None   Collection Time: 11/26/19 12:00 AM  Result Value Ref Range   HM Colonoscopy See Report (in chart) See Report (in chart), Patient Reported  Surgical pathology     Status: None   Collection Time: 11/26/19  9:59 AM  Result Value Ref Range   SURGICAL PATHOLOGY      SURGICAL PATHOLOGY CASE: [id number] PATIENT: [facility] Surgical Pathology Report     Specimen Submitted: A. Colon  polyp, cecum; hot snare  Clinical History: Screening    DIAGNOSIS: A. COLON POLYP, CECUM; HOT SNARE: - TUBULAR ADENOMA. - NEGATIVE FOR HIGH-GRADE DYSPLASIA AND MALIGNANCY.  GROSS DESCRIPTION: A. Labeled: Hot snare cecal polyp Received: Formalin Tissue fragment(s): Multiple Size: Aggregate, 1.4 x 0.6 x 0.3 cm Description: Tan soft tissue fragments Entirely submitted in 1 cassette.    Final Diagnosis performed by Allena Napoleon, MD.   Electronically signed 11/27/2019 8:19:29AM The electronic signature indicates that the named Attending Pathologist has evaluated the specimen Technical component performed at [facility], [address] [place], [place] [address] Lab: [phone] Dir: Rush Farmer, MD, MMM  Professional component performed at [facility], [facility], [place], [place], [place] [address] Lab: [phone]  Dir: Dellia Nims. Reuel Derby, MD   PSA     Status: None   Collection Time: 01/23/20  8:39 AM  Result Value Ref Range   Prostate Specific Ag, Serum 2.4 0.0 - 4.0 ng/mL    Comment: Roche ECLIA methodology. According to the American Urological Association, Serum PSA should decrease and remain at undetectable levels after radical prostatectomy. The AUA defines biochemical recurrence as an initial PSA value 0.2 ng/mL or greater followed by a subsequent confirmatory PSA value 0.2 ng/mL or greater. Values  obtained with different assay methods or kits cannot be used interchangeably. Results cannot be interpreted as absolute evidence of the presence or absence of malignant disease.   Urinalysis, Complete     Status: None   Collection Time: 01/30/20  8:26 AM  Result Value Ref Range   Specific Gravity, UA 1.020 1.005 - 1.030   pH, UA 7.0 5.0 - 7.5   Color, UA Yellow Yellow   Appearance Ur Clear Clear   Leukocytes,UA Negative Negative   Protein,UA Negative Negative/Trace   Glucose, UA Negative Negative   Ketones, UA Negative Negative   RBC, UA Negative Negative   Bilirubin, UA Negative Negative   Urobilinogen, Ur 0.2 0.2 - 1.0 mg/dL   Nitrite, UA Negative Negative   Microscopic Examination See below:   Microscopic Examination     Status: Abnormal   Collection Time: 01/30/20  8:26 AM   Urine  Result Value Ref Range   WBC, UA 6-10 (A) 0 - 5 /hpf   RBC 0-2 0 - 2 /hpf   Epithelial Cells (non renal) 0-10 0 - 10 /hpf   Bacteria, UA None seen None seen/Few     PHQ2/9: Depression screen [facility] 2/9 02/17/2020 10/14/2019 08/13/2019 02/13/2019 10/10/2018  Decreased Interest 0 0 0 0 0  Down, Depressed, Hopeless 0 0 0 0 0  PHQ - 2 Score 0 0 0 0 0  Altered sleeping - - 0 0 0  Tired, decreased energy - - 0 0 0  Change in appetite - - 0 0 0  Feeling bad or failure about yourself  - - 0 0 0  Trouble concentrating - - 0 0 0  Moving slowly or fidgety/restless - - 0 0 0  Suicidal thoughts - - 0 0 0  PHQ-9 Score - - 0 0 0  Difficult doing work/chores - - - Not difficult at all Not difficult at all    phq 9 is negative   Fall Risk: Fall Risk  02/17/2020 10/14/2019 08/21/2019 08/13/2019 02/13/2019  Falls in the past year? 0 0 0 0 0  Number falls in past yr: 0 0 0 0 0  Injury with Fall? 0 0 0 1 0  Risk for fall due to : -  No Fall Risks - - -  Risk for fall due to: Comment - - - - -  Follow up - Falls prevention discussed - - -     Functional Status Survey: Is the patient deaf or have difficulty  hearing?: No Does the patient have difficulty seeing, even when wearing glasses/contacts?: No Does the patient have difficulty concentrating, remembering, or making decisions?: No Does the patient have difficulty walking or climbing stairs?: No Does the patient have difficulty dressing or bathing?: No Does the patient have difficulty doing errands alone such as visiting a doctor's office or shopping?: No    Assessment & Plan  1. COPD, moderate ([place])  - umeclidinium-vilanterol (ANORO ELLIPTA) 62.5-25 MCG/INH AEPB; INHALE 1 PUFF BY MOUTH INTO THE LUNGS DAILY  Dispense: 60 each; Refill: 5  2. GERD without esophagitis  - pantoprazole (PROTONIX) 40 MG tablet; Take 1 tablet (40 mg total) by mouth daily.  Dispense: 90 tablet; Refill: 1  3. Atherosclerosis of aorta (HCC)  - nitroGLYCERIN (NITROSTAT) 0.4 MG SL tablet; Place 1 tablet (0.4 mg total) under the tongue every 5 (five) minutes as needed for chest pain.  Dispense: 30 tablet; Refill: 0  4. Need for immunization against influenza  - Flu Vaccine QUAD High Dose(Fluad)  5. History of alcoholism ([place])  Doing well  6. B12 neuropathy (HCC)  stable  7. Dyslipidemia  On statin therapy   8. Stage 3a chronic kidney disease (HCC)  - BASIC METABOLIC PANEL WITH GFR  9. CAD, multiple vessel  On medical management   10. History of gastric ulcer  - pantoprazole (PROTONIX) 40 MG tablet; Take 1 tablet (40 mg total) by mouth daily.  Dispense: 90 tablet; Refill: 1  11. Anginal equivalent (HCC)  - nitroGLYCERIN (NITROSTAT) 0.4 MG SL tablet; Place 1 tablet (0.4 mg total) under the tongue every 5 (five) minutes as needed for chest pain.  Dispense: 30 tablet; Refill: 0  12. Bronchiectasis without complication ([place])

## 2020-02-17 ENCOUNTER — Ambulatory Visit (INDEPENDENT_AMBULATORY_CARE_PROVIDER_SITE_OTHER): Payer: Medicare Other | Admitting: Family Medicine

## 2020-02-17 ENCOUNTER — Other Ambulatory Visit: Payer: Self-pay

## 2020-02-17 ENCOUNTER — Encounter: Payer: Self-pay | Admitting: Family Medicine

## 2020-02-17 VITALS — BP 128/60 | HR 76 | Temp 98.2°F | Resp 16 | Ht 71.0 in | Wt 162.3 lb

## 2020-02-17 DIAGNOSIS — Z23 Encounter for immunization: Secondary | ICD-10-CM | POA: Insufficient documentation

## 2020-02-17 DIAGNOSIS — I700 Atherosclerosis of aorta: Secondary | ICD-10-CM | POA: Diagnosis not present

## 2020-02-17 DIAGNOSIS — K219 Gastro-esophageal reflux disease without esophagitis: Secondary | ICD-10-CM | POA: Diagnosis not present

## 2020-02-17 DIAGNOSIS — E538 Deficiency of other specified B group vitamins: Secondary | ICD-10-CM

## 2020-02-17 DIAGNOSIS — N1831 Chronic kidney disease, stage 3a: Secondary | ICD-10-CM | POA: Diagnosis not present

## 2020-02-17 DIAGNOSIS — J449 Chronic obstructive pulmonary disease, unspecified: Secondary | ICD-10-CM

## 2020-02-17 DIAGNOSIS — G63 Polyneuropathy in diseases classified elsewhere: Secondary | ICD-10-CM

## 2020-02-17 DIAGNOSIS — J479 Bronchiectasis, uncomplicated: Secondary | ICD-10-CM | POA: Insufficient documentation

## 2020-02-17 DIAGNOSIS — F1021 Alcohol dependence, in remission: Secondary | ICD-10-CM

## 2020-02-17 DIAGNOSIS — I2510 Atherosclerotic heart disease of native coronary artery without angina pectoris: Secondary | ICD-10-CM

## 2020-02-17 DIAGNOSIS — I208 Other forms of angina pectoris: Secondary | ICD-10-CM

## 2020-02-17 DIAGNOSIS — E785 Hyperlipidemia, unspecified: Secondary | ICD-10-CM

## 2020-02-17 DIAGNOSIS — Z8711 Personal history of peptic ulcer disease: Secondary | ICD-10-CM

## 2020-02-17 MED ORDER — NITROGLYCERIN 0.4 MG SL SUBL: 0.4000 mg | SUBLINGUAL_TABLET | SUBLINGUAL | 0 refills | Status: DC | PRN

## 2020-02-17 MED ORDER — PANTOPRAZOLE SODIUM 40 MG PO TBEC: 40.0000 mg | DELAYED_RELEASE_TABLET | Freq: Every day | ORAL | 1 refills | Status: DC

## 2020-02-17 MED ORDER — ANORO ELLIPTA 62.5-25 MCG/INH IN AEPB: INHALATION_SPRAY | RESPIRATORY_TRACT | 5 refills | Status: DC

## 2020-02-18 LAB — BASIC METABOLIC PANEL WITH GFR
BUN/Creatinine Ratio: 6 (calc) (ref 6–22)
BUN: 10 mg/dL (ref 7–25)
CO2: 28 mmol/L (ref 20–32)
Calcium: 9.6 mg/dL (ref 8.6–10.3)
Chloride: 106 mmol/L (ref 98–110)
Creat: 1.54 mg/dL — ABNORMAL HIGH (ref 0.70–1.18)
GFR, Est African American: 51 mL/min/{1.73_m2} — ABNORMAL LOW (ref >=60)
GFR, Est Non African American: 44 mL/min/{1.73_m2} — ABNORMAL LOW (ref >=60)
Glucose, Bld: 88 mg/dL (ref 65–99)
Potassium: 3.9 mmol/L (ref 3.5–5.3)
Sodium: 140 mmol/L (ref 135–146)

## 2020-03-17 ENCOUNTER — Other Ambulatory Visit: Payer: Self-pay | Admitting: Family Medicine

## 2020-03-17 DIAGNOSIS — I700 Atherosclerosis of aorta: Secondary | ICD-10-CM

## 2020-03-17 DIAGNOSIS — I208 Other forms of angina pectoris: Secondary | ICD-10-CM

## 2020-03-25 ENCOUNTER — Other Ambulatory Visit: Payer: Self-pay | Admitting: Family Medicine

## 2020-03-25 DIAGNOSIS — J449 Chronic obstructive pulmonary disease, unspecified: Secondary | ICD-10-CM

## 2020-03-29 ENCOUNTER — Other Ambulatory Visit: Payer: Self-pay | Admitting: Urology

## 2020-03-29 DIAGNOSIS — N401 Enlarged prostate with lower urinary tract symptoms: Secondary | ICD-10-CM

## 2020-04-05 ENCOUNTER — Other Ambulatory Visit: Payer: Self-pay | Admitting: Family Medicine

## 2020-04-05 DIAGNOSIS — Z8711 Personal history of peptic ulcer disease: Secondary | ICD-10-CM

## 2020-04-05 DIAGNOSIS — K219 Gastro-esophageal reflux disease without esophagitis: Secondary | ICD-10-CM

## 2020-05-31 ENCOUNTER — Telehealth: Payer: Self-pay | Admitting: Cardiovascular Disease

## 2020-05-31 NOTE — Telephone Encounter (Signed)
Patient has been contacted at least 3 times for a recall, recall has been deleted

## 2020-08-03 ENCOUNTER — Telehealth: Payer: Self-pay | Admitting: *Deleted

## 2020-08-03 DIAGNOSIS — Z87891 Personal history of nicotine dependence: Secondary | ICD-10-CM

## 2020-08-03 DIAGNOSIS — Z122 Encounter for screening for malignant neoplasm of respiratory organs: Secondary | ICD-10-CM

## 2020-08-03 DIAGNOSIS — F17200 Nicotine dependence, unspecified, uncomplicated: Secondary | ICD-10-CM

## 2020-08-03 NOTE — Telephone Encounter (Signed)
Spoke to patient's wife re: scheduling the annual lung screening scan. Current smoker, 1.5 ppd x 44 yrs. Scan scheduled on 08/25/20 @ 11:35 am.

## 2020-08-07 ENCOUNTER — Other Ambulatory Visit: Payer: Self-pay | Admitting: Family Medicine

## 2020-08-07 DIAGNOSIS — E785 Hyperlipidemia, unspecified: Secondary | ICD-10-CM

## 2020-08-07 NOTE — Telephone Encounter (Signed)
Requested Prescriptions  Pending Prescriptions Disp Refills  . atorvastatin (LIPITOR) 40 MG tablet [Pharmacy Med Name: ATORVASTATIN 40MG  TABLETS] 30 tablet 0    Sig: TAKE 1 TABLET(40 MG) BY MOUTH DAILY     Cardiovascular:  Antilipid - Statins Passed - 08/07/2020  3:38 AM      Passed - Total Cholesterol in normal range and within 360 days    Cholesterol, Total  Date Value Ref Range Status  12/03/2014 92 (L) 100 - 199 mg/dL Final   Cholesterol  Date Value Ref Range Status  08/13/2019 102 <200 mg/dL Final         Passed - LDL in normal range and within 360 days    LDL Cholesterol (Calc)  Date Value Ref Range Status  08/13/2019 38 mg/dL (calc) Final    Comment:    Reference range: <100 . Desirable range <100 mg/dL for primary prevention;   <70 mg/dL for patients with CHD or diabetic patients  with > or = 2 CHD risk factors. Marland Kitchen LDL-C is now calculated using the Martin-Hopkins  calculation, which is a validated novel method providing  better accuracy than the Friedewald equation in the  estimation of LDL-C.  Cresenciano Genre et al. Annamaria Helling. 2263;335(45): 2061-2068  (http://education.[employer].com/faq/FAQ164)          Passed - HDL in normal range and within 360 days    HDL  Date Value Ref Range Status  08/13/2019 47 > OR = 40 mg/dL Final  12/03/2014 49 >39 mg/dL Final    Comment:    According to ATP-III Guidelines, HDL-C >59 mg/dL is considered a negative risk factor for CHD.          Passed - Triglycerides in normal range and within 360 days    Triglycerides  Date Value Ref Range Status  08/13/2019 86 <150 mg/dL Final         Passed - Patient is not pregnant      Passed - Valid encounter within last 12 months    Recent Outpatient Visits          5 months ago COPD, moderate [facility])   [facility] Steele Sizer, MD   12 months ago Dyslipidemia   [facility] Steele Sizer, MD   1 year ago Gynecomastia   [facility] Steele Sizer, MD   1 year ago Thrombocytopenia [facility])   [facility] Steele Sizer, MD   2 years ago Chronic kidney disease (CKD), stage III (moderate) [facility])   [facility] Steele Sizer, MD      Future Appointments            In 1 week Steele Sizer, MD [facility], [place]   In 2 months  [facility] [place]   In 6 months [place], [facility]

## 2020-08-16 NOTE — Progress Notes (Signed)
Name: Kevin Roberts   MRN: [id number]    DOB: [birth year]   Date:08/17/2020       Progress Note  Subjective  Chief Complaint  Follow Up  HPI  COPD Moderate: he is takingAnoro,he denies any cough, wheezing , he is not very active,states SOB only with moderate activity.He still usually smoking 1-  1.5 pack daily Last CT lung was done 08/2019, no nodules, centrilobular and paraseptal emphysema, scattered areas of bronchiectasis. He is not ready to quit smoking yet, advised to try a hobby that uses his hands He used to garden but not lately   History of Alcoholism: he used to drink before retirement - about 2 shots at night, however when he retired at age 41 he started to drink about 1/5 of liquor every two days. He states he stopped drinking because he fell on his face, developed a gastric ulcer and B12 deficiency. He is doing well since he stopped drinkingaround 2010.He still has tingling on his legs  And some pain but not severe - it is due to  neuropathy, taking gabapentin and it helps with symptoms   GERD and history of gastric ulcer:/hiatal hernia  He states symptoms controlled as long as he takes a PPI, no heartburn or regurgitation. He was seen by Dr. Fleet Contras and had EGD in August2019, large diverticulum but no signs of bleeding and has hiatal hernia small to moderate in size. Episodes of GERD a couple of times a month, usually at night , he states triggered by certain foods.   Hyperlipidemia: taking Atorvastatin and denies side effects. Taking aspirin daily . Denies myalgia.We will recheck labs today   CAD/Angina : found on CT chest 3 vessel disease he denies chest pain ,it may be multifactorial from COPD also. He was seen by Dr. Fletcher Anon and advised to quit smoking. On aspirin, statins and beta-blocker. Cardiac cath in 2017 showed mild to moderate non-obstructive disease and is only on medical management. He saw Dr. Fletcher Anon 11/2017 . He used NTG at most twice since his last visit  with me. We will refer him back to Dr Fletcher Anon since he was supposed to follow up in 2020   B12 neuropathy: he is taking B12 supplementation and also takes Neurontin, he states tingling has improved but still present. Tips of fingers and also both legs and feet. He is doing better with function, able to button his own shirt . Unchanged   Tobacco Abuse: he has been a smoker for 44years, he is up to date with triple A screening, negative in 2016, chest CT done in 11/2013 . 06/2017, and last one 08/2018, already scheduled from 08/25/2020  . Explained importance of quitting smoking again, he states he used nicotine patches in the past, he states it helped temporarily, but when he skipped doses he smoked more  He is on statin therapy and baby aspirin, he has been smoking more lately , up to 1.5 pack daily   Atherosclerosis abdominal aorta: on medical management, aspirin,  and statin therapy.He wants to quit smoking, but has not been able to quit yet   CKI: stage III,he avoiding NSAID's, no pruritis, good urine output , we will recheck labs today   Renal Cyst: picked up on CT lung and had follow up renal US, benign cyst . Checked by Zara Council  Left gynecomastia: we did blood work in the fall that was normal.  No pain or discomfort , CT did not show anything , size is unchnaged  Patient Active Problem List   Diagnosis Date Noted  . Bronchiectasis without complication ([place]) [id number]  . History of gastric ulcer 02/17/2020  . Need for immunization against influenza 02/17/2020  . Hiatal hernia 01/22/2018  . Abdominal pain, chronic, right lower quadrant 10/26/2017  . Esophageal diverticulum 10/26/2017  . History of alcoholism ([place]) 01/18/2016  . Gynecomastia 10/14/2015  . Abnormal nuclear stress test   . Anginal equivalent ([place])   . Atherosclerosis of aorta ([place]) 04/15/2015  . BPH with obstruction/lower urinary tract symptoms 02/03/2015  . Renal cyst, left 10/13/2014  . Tobacco  use   . B12 neuropathy ([place]) 10/04/2014  . CAD, multiple vessel 10/04/2014  . Benign essential HTN 10/04/2014  . Chronic kidney disease (CKD), stage III (moderate) ([place]) 10/04/2014  . Dyslipidemia 10/04/2014  . COPD, moderate ([place]) 10/04/2014  . Gastro-esophageal reflux disease without esophagitis 10/04/2014  . Enlarged prostate 10/04/2014  . Vitamin D deficiency 10/04/2014    Past Surgical History:  Procedure Laterality Date  . CARDIAC CATHETERIZATION N/A 07/02/2015   Procedure: Left Heart Cath and Coronary Angiography;  Surgeon: Wellington Hampshire, MD;  Location: [place] [facility];  Service: Cardiovascular;  Laterality: N/A;  . COLONOSCOPY  2011   [place]  . COLONOSCOPY WITH PROPOFOL N/A 11/26/2019   Procedure: COLONOSCOPY WITH PROPOFOL;  Surgeon: Robert Bellow, MD;  Location: [facility];  Service: Endoscopy;  Laterality: N/A;  . ESOPHAGOGASTRODUODENOSCOPY (EGD) WITH PROPOFOL N/A 11/14/2017   Procedure: ESOPHAGOGASTRODUODENOSCOPY (EGD) WITH PROPOFOL;  Surgeon: Robert Bellow, MD;  Location: [facility];  Service: Endoscopy;  Laterality: N/A;  . throat biopsy     lump removed    Family History  Problem Relation Age of Onset  . Hypertension Mother   . CVA Mother   . Cancer Father        Lung  . Mental illness Brother        1-Schizophrenia  . GER disease Son   . Healthy Sister   . Heart Problems Brother   . Gallbladder disease Brother   . Healthy Sister   . Gallbladder disease Brother   . Hypertension Brother     Social History   Tobacco Use  . Smoking status: Current Every Day Smoker    Packs/day: 1.50    Years: 44.00    Pack years: 66.00    Types: Cigarettes    Start date: 10/04/1973  . Smokeless tobacco: Never Used  . Tobacco comment: [place] Smoking Cessation Information given   Substance Use Topics  . Alcohol use: No    Alcohol/week: 0.0 standard drinks     Current Outpatient Medications:  .  aspirin 81 MG tablet, Take 1 tablet by mouth  daily., Disp: , Rfl:  .  atorvastatin (LIPITOR) 40 MG tablet, TAKE 1 TABLET(40 MG) BY MOUTH DAILY, Disp: 30 tablet, Rfl: 0 .  cholecalciferol (VITAMIN D) 25 MCG (1000 UNIT) tablet, Take by mouth., Disp: , Rfl:  .  Cyanocobalamin (B-12) 1000 MCG TBCR, Take 1 tablet by mouth daily., Disp: , Rfl:  .  gabapentin (NEURONTIN) 300 MG capsule, TAKE 1 CAPSULE(300 MG) BY MOUTH TWICE DAILY, Disp: 180 capsule, Rfl: 1 .  MULTIPLE VITAMIN PO, Take 1 tablet by mouth daily., Disp: , Rfl:  .  nitroGLYCERIN (NITROSTAT) 0.4 MG SL tablet, PLACE ONE TABLET UNDER TONGUE AS NEEDED FOR CHEST PAIN EVERY 5 MINUTES, Disp: 30 tablet, Rfl: 2 .  pantoprazole (PROTONIX) 40 MG tablet, TAKE 1 TABLET(40 MG) BY MOUTH DAILY, Disp: 90 tablet, Rfl:  1 .  tamsulosin (FLOMAX) 0.4 MG CAPS capsule, Take 1 capsule (0.4 mg total) by mouth daily., Disp: 90 capsule, Rfl: 3 .  umeclidinium-vilanterol (ANORO ELLIPTA) 62.5-25 MCG/INH AEPB, INHALE 1 PUFF BY MOUTH INTO LUNGS DAILY, Disp: 60 each, Rfl: 5 .  Vitamin D, Cholecalciferol, 1000 UNITS CAPS, Take 1 tablet by mouth daily., Disp: , Rfl:   No Known Allergies  I personally reviewed active problem list, medication list, allergies, family history, social history, health maintenance with the patient/caregiver today.   ROS  Constitutional: Negative for fever or weight change.  Respiratory:Postiive for cough and shortness of breath.   Cardiovascular: Negative for chest pain or palpitations.  Gastrointestinal: Negative for abdominal pain, no bowel changes.  Musculoskeletal: Negative for gait problem or joint swelling.  Skin: Negative for rash.  Neurological: Negative for dizziness or headache.  No other specific complaints in a complete review of systems (except as listed in HPI above).   Objective  Vitals:   08/17/20 0814  BP: 130/72  Pulse: 78  Resp: 16  Temp: 98.7 F (37.1 C)  TempSrc: Oral  SpO2: 99%  Weight: 162 lb 9.6 oz (73.8 kg)  Height: 5\' 11"  (1.803 m)    Body mass  index is 22.68 kg/m.  Physical Exam  Constitutional: Patient appears well-developed and well-nourished.  No distress.  HEENT: head atraumatic, normocephalic, pupils equal and reactive to light,  neck supple Cardiovascular: Normal rate, regular rhythm and normal heart sounds.  No murmur heard. No BLE edema. Pulmonary/Chest: Effort normal and breath sounds normal. No respiratory distress. Abdominal: Soft.  There is no tenderness. Psychiatric: Patient has a normal mood and affect. behavior is normal. Judgment and thought content normal.  PHQ2/9: Depression screen [facility] 2/9 08/17/2020 02/17/2020 10/14/2019 08/13/2019 02/13/2019  Decreased Interest 0 0 0 0 0  Down, Depressed, Hopeless 0 0 0 0 0  PHQ - 2 Score 0 0 0 0 0  Altered sleeping - - - 0 0  Tired, decreased energy - - - 0 0  Change in appetite - - - 0 0  Feeling bad or failure about yourself  - - - 0 0  Trouble concentrating - - - 0 0  Moving slowly or fidgety/restless - - - 0 0  Suicidal thoughts - - - 0 0  PHQ-9 Score - - - 0 0  Difficult doing work/chores - - - - Not difficult at all    phq 9 is negative   Fall Risk: Fall Risk  08/17/2020 02/17/2020 10/14/2019 08/21/2019 08/13/2019  Falls in the past year? 0 0 0 0 0  Number falls in past yr: 0 0 0 0 0  Injury with Fall? 0 0 0 0 1  Risk for fall due to : - - No Fall Risks - -  Risk for fall due to: Comment - - - - -  Follow up Falls evaluation completed - Falls prevention discussed - -      Functional Status Survey: Is the patient deaf or have difficulty hearing?: No Does the patient have difficulty seeing, even when wearing glasses/contacts?: No Does the patient have difficulty concentrating, remembering, or making decisions?: No Does the patient have difficulty walking or climbing stairs?: No Does the patient have difficulty dressing or bathing?: No Does the patient have difficulty doing errands alone such as visiting a doctor's office or shopping?: No   Assessment &  Plan  1. COPD, moderate ([place])  - umeclidinium-vilanterol (ANORO ELLIPTA) 62.5-25 MCG/INH AEPB; INHALE 1 PUFF BY  MOUTH INTO LUNGS DAILY  Dispense: 54 each; Refill: 5  2. GERD without esophagitis  - pantoprazole (PROTONIX) 40 MG tablet; Take 1 tablet (40 mg total) by mouth daily.  Dispense: 90 tablet; Refill: 1  3. History of alcoholism ([place])  Quit in 2010  4. Atherosclerosis of aorta ([place])  - Ambulatory referral to Cardiology  5. Dyslipidemia  - atorvastatin (LIPITOR) 40 MG tablet; Take 1 tablet (40 mg total) by mouth daily.  Dispense: 90 tablet; Refill: 3  6. Stage 3a chronic kidney disease ([place])   7. CAD, multiple vessel  - Ambulatory referral to Cardiology  8. B12 neuropathy (HCC)  - gabapentin (NEURONTIN) 300 MG capsule; Take 1 capsule (300 mg total) by mouth 2 (two) times daily.  Dispense: 180 capsule; Refill: 3  9. History of gastric ulcer  - pantoprazole (PROTONIX) 40 MG tablet; Take 1 tablet (40 mg total) by mouth daily.  Dispense: 90 tablet; Refill: 1  10. Anginal equivalent ([place])   11. Bronchiectasis without complication ([place])

## 2020-08-17 ENCOUNTER — Encounter: Payer: Self-pay | Admitting: Family Medicine

## 2020-08-17 ENCOUNTER — Ambulatory Visit (INDEPENDENT_AMBULATORY_CARE_PROVIDER_SITE_OTHER): Payer: Medicare Other | Admitting: Family Medicine

## 2020-08-17 ENCOUNTER — Other Ambulatory Visit: Payer: Self-pay

## 2020-08-17 VITALS — BP 130/72 | HR 78 | Temp 98.7°F | Resp 16 | Ht 71.0 in | Wt 162.6 lb

## 2020-08-17 DIAGNOSIS — I208 Other forms of angina pectoris: Secondary | ICD-10-CM

## 2020-08-17 DIAGNOSIS — E538 Deficiency of other specified B group vitamins: Secondary | ICD-10-CM

## 2020-08-17 DIAGNOSIS — G63 Polyneuropathy in diseases classified elsewhere: Secondary | ICD-10-CM

## 2020-08-17 DIAGNOSIS — J449 Chronic obstructive pulmonary disease, unspecified: Secondary | ICD-10-CM

## 2020-08-17 DIAGNOSIS — J479 Bronchiectasis, uncomplicated: Secondary | ICD-10-CM

## 2020-08-17 DIAGNOSIS — E785 Hyperlipidemia, unspecified: Secondary | ICD-10-CM | POA: Diagnosis not present

## 2020-08-17 DIAGNOSIS — F1021 Alcohol dependence, in remission: Secondary | ICD-10-CM | POA: Diagnosis not present

## 2020-08-17 DIAGNOSIS — Z8711 Personal history of peptic ulcer disease: Secondary | ICD-10-CM

## 2020-08-17 DIAGNOSIS — I700 Atherosclerosis of aorta: Secondary | ICD-10-CM

## 2020-08-17 DIAGNOSIS — K219 Gastro-esophageal reflux disease without esophagitis: Secondary | ICD-10-CM | POA: Diagnosis not present

## 2020-08-17 DIAGNOSIS — N1831 Chronic kidney disease, stage 3a: Secondary | ICD-10-CM

## 2020-08-17 DIAGNOSIS — I2510 Atherosclerotic heart disease of native coronary artery without angina pectoris: Secondary | ICD-10-CM

## 2020-08-17 MED ORDER — ANORO ELLIPTA 62.5-25 MCG/INH IN AEPB: INHALATION_SPRAY | RESPIRATORY_TRACT | 5 refills | Status: DC

## 2020-08-17 MED ORDER — GABAPENTIN 300 MG PO CAPS: 300.0000 mg | ORAL_CAPSULE | Freq: Two times a day (BID) | ORAL | 3 refills | Status: DC

## 2020-08-17 MED ORDER — PANTOPRAZOLE SODIUM 40 MG PO TBEC: 40.0000 mg | DELAYED_RELEASE_TABLET | Freq: Every day | ORAL | 1 refills | Status: DC

## 2020-08-17 MED ORDER — ATORVASTATIN CALCIUM 40 MG PO TABS: 40.0000 mg | ORAL_TABLET | Freq: Every day | ORAL | 3 refills | Status: DC

## 2020-08-17 NOTE — Addendum Note (Signed)
Addended by: Steele Sizer F on: 08/17/2020 09:00 AM   Modules accepted: Orders

## 2020-08-18 LAB — LIPID PANEL
Cholesterol: 103 mg/dL (ref <200)
HDL: 57 mg/dL (ref >=40)
LDL Cholesterol (Calc): 30 mg/dL (calc)
Non-HDL Cholesterol (Calc): 46 mg/dL (calc) (ref <130)
Total CHOL/HDL Ratio: 1.8 (calc) (ref <5.0)
Triglycerides: 81 mg/dL (ref <150)

## 2020-08-18 LAB — CBC WITH DIFFERENTIAL/PLATELET
Absolute Monocytes: 624 cells/uL (ref 200–950)
Basophils Absolute: 32 cells/uL (ref 0–200)
Basophils Relative: 0.4 %
Eosinophils Absolute: 40 cells/uL (ref 15–500)
Eosinophils Relative: 0.5 %
HCT: 45.5 % (ref 38.5–50.0)
Hemoglobin: 15.1 g/dL (ref 13.2–17.1)
Lymphs Abs: 1620 cells/uL (ref 850–3900)
MCH: 30.8 pg (ref 27.0–33.0)
MCHC: 33.2 g/dL (ref 32.0–36.0)
MCV: 92.9 fL (ref 80.0–100.0)
MPV: 12.6 fL — ABNORMAL HIGH (ref 7.5–12.5)
Monocytes Relative: 7.9 %
Neutro Abs: 5585 cells/uL (ref 1500–7800)
Neutrophils Relative %: 70.7 %
Platelets: 152 10*3/uL (ref 140–400)
RBC: 4.9 10*6/uL (ref 4.20–5.80)
RDW: 12.1 % (ref 11.0–15.0)
Total Lymphocyte: 20.5 %
WBC: 7.9 10*3/uL (ref 3.8–10.8)

## 2020-08-18 LAB — COMPLETE METABOLIC PANEL WITH GFR
AG Ratio: 1.7 (calc) (ref 1.0–2.5)
ALT: 14 U/L (ref 9–46)
AST: 21 U/L (ref 10–35)
Albumin: 4.5 g/dL (ref 3.6–5.1)
Alkaline phosphatase (APISO): 53 U/L (ref 35–144)
BUN/Creatinine Ratio: 7 (calc) (ref 6–22)
BUN: 10 mg/dL (ref 7–25)
CO2: 30 mmol/L (ref 20–32)
Calcium: 9.9 mg/dL (ref 8.6–10.3)
Chloride: 106 mmol/L (ref 98–110)
Creat: 1.52 mg/dL — ABNORMAL HIGH (ref 0.70–1.18)
GFR, Est African American: 52 mL/min/{1.73_m2} — ABNORMAL LOW (ref >=60)
GFR, Est Non African American: 45 mL/min/{1.73_m2} — ABNORMAL LOW (ref >=60)
Globulin: 2.6 g/dL (calc) (ref 1.9–3.7)
Glucose, Bld: 91 mg/dL (ref 65–99)
Potassium: 4.2 mmol/L (ref 3.5–5.3)
Sodium: 140 mmol/L (ref 135–146)
Total Bilirubin: 1.1 mg/dL (ref 0.2–1.2)
Total Protein: 7.1 g/dL (ref 6.1–8.1)

## 2020-08-25 ENCOUNTER — Ambulatory Visit
Admission: RE | Admit: 2020-08-25 | Discharge: 2020-08-25 | Disposition: A | Discharge status: Home / Self Care | Payer: Medicare Other | Source: Ambulatory Visit | Attending: Oncology | Admitting: Oncology

## 2020-08-25 ENCOUNTER — Other Ambulatory Visit: Payer: Self-pay

## 2020-08-25 DIAGNOSIS — F17200 Nicotine dependence, unspecified, uncomplicated: Secondary | ICD-10-CM | POA: Diagnosis not present

## 2020-08-25 DIAGNOSIS — Z87891 Personal history of nicotine dependence: Secondary | ICD-10-CM | POA: Insufficient documentation

## 2020-08-25 DIAGNOSIS — Z122 Encounter for screening for malignant neoplasm of respiratory organs: Secondary | ICD-10-CM | POA: Diagnosis not present

## 2020-08-26 ENCOUNTER — Ambulatory Visit (INDEPENDENT_AMBULATORY_CARE_PROVIDER_SITE_OTHER): Payer: Medicare Other | Admitting: Nurse Practitioner

## 2020-08-26 ENCOUNTER — Encounter: Payer: Self-pay | Admitting: Nurse Practitioner

## 2020-08-26 VITALS — BP 122/68 | HR 80 | Ht 71.0 in | Wt 167.0 lb

## 2020-08-26 DIAGNOSIS — N183 Chronic kidney disease, stage 3 unspecified: Secondary | ICD-10-CM | POA: Diagnosis not present

## 2020-08-26 DIAGNOSIS — I2510 Atherosclerotic heart disease of native coronary artery without angina pectoris: Secondary | ICD-10-CM

## 2020-08-26 DIAGNOSIS — Z720 Tobacco use: Secondary | ICD-10-CM | POA: Diagnosis not present

## 2020-08-26 DIAGNOSIS — E782 Mixed hyperlipidemia: Secondary | ICD-10-CM | POA: Diagnosis not present

## 2020-08-26 DIAGNOSIS — I25118 Atherosclerotic heart disease of native coronary artery with other forms of angina pectoris: Secondary | ICD-10-CM

## 2020-08-26 MED ORDER — ISOSORBIDE MONONITRATE ER 30 MG PO TB24: 15.0000 mg | ORAL_TABLET | Freq: Every day | ORAL | 3 refills | Status: DC

## 2020-08-26 NOTE — Patient Instructions (Addendum)
Medication Instructions:  Your physician has recommended you make the following change in your medication:   1. START Isosorbide mononitrate 30 mg and take one half tablet (15 mg) once daily.  *If you need a refill on your cardiac medications before your next appointment, please call your pharmacy*   Lab Work: None   If you have labs (blood work) drawn today and your tests are completely normal, you will receive your results only by: Marland Kitchen MyChart Message (if you have MyChart) OR . A paper copy in the mail If you have any lab test that is abnormal or we need to change your treatment, we will call you to review the results.   Testing/Procedures: None   Follow-Up: At [facility], you and your health needs are our priority.  As part of our continuing mission to provide you with exceptional heart care, we have created designated Provider Care Teams.  These Care Teams include your primary Cardiologist (physician) and Advanced Practice Providers (APPs -  Physician Assistants and Nurse Practitioners) who all work together to provide you with the care you need, when you need it.  We recommend signing up for the patient portal called "MyChart".  Sign up information is provided on this After Visit Summary.  MyChart is used to connect with patients for Virtual Visits (Telemedicine).  Patients are able to view lab/test results, encounter notes, upcoming appointments, etc.  Non-urgent messages can be sent to your provider as well.   To learn more about what you can do with MyChart, go to [email or web address].    Your next appointment:   3 month(s)  The format for your next appointment:   In Person  Provider:   [name], MD or Murray Hodgkins, NP

## 2020-08-26 NOTE — Progress Notes (Signed)
Office Visit    Patient Name: Kevin Roberts Date of Encounter: 08/26/2020  Primary Care Provider:  Steele Sizer, MD Primary Cardiologist:  [name], MD  Chief Complaint    75 year old male with a history of nonobstructive coronary artery disease, hypertension, hyperlipidemia, tobacco abuse, COPD, BPH, and GERD, who presents for follow-up related to chest pain.  Past Medical History    Past Medical History:  Diagnosis Date  . Benign prostatic hypertrophy   . Chronic kidney disease, stage III (moderate) (HCC)   . COPD (chronic obstructive pulmonary disease) ([place])   . Cramps, muscle, general   . Elevated ferritin   . Emphysema of lung ([place])   . GERD (gastroesophageal reflux disease)   . History of hypercalcemia   . Hyperlipidemia   . Hypertension   . Incomplete bladder emptying   . Nocturia   . Nonobstructive CAD    a. 06/2015 MV: mid ant/antseptal/apical anterior ischemia; b. 06/2015 Cath: LM nl, LAD Ca2+, LCX min irregs, OM1/2/3 nl, RCA mod Ca2+, RPDA/RPAV/RPL1,2,3 nl. EF 55-65%.  . Peripheral polyneuropathy   . Rising PSA level   . Tobacco use   . Urgency of micturation   . Vitamin B 12 deficiency    Past Surgical History:  Procedure Laterality Date  . CARDIAC CATHETERIZATION N/A 07/02/2015   Procedure: Left Heart Cath and Coronary Angiography;  Surgeon: Wellington Hampshire, MD;  Location: [place] [facility];  Service: Cardiovascular;  Laterality: N/A;  . COLONOSCOPY  2011   [place]  . COLONOSCOPY WITH PROPOFOL N/A 11/26/2019   Procedure: COLONOSCOPY WITH PROPOFOL;  Surgeon: Robert Bellow, MD;  Location: [facility];  Service: Endoscopy;  Laterality: N/A;  . ESOPHAGOGASTRODUODENOSCOPY (EGD) WITH PROPOFOL N/A 11/14/2017   Procedure: ESOPHAGOGASTRODUODENOSCOPY (EGD) WITH PROPOFOL;  Surgeon: Robert Bellow, MD;  Location: [facility];  Service: Endoscopy;  Laterality: N/A;  . throat biopsy     lump removed    Allergies  No Known Allergies  History  of Present Illness    75 year old male with above past medical history including nonobstructive CAD, hypertension, hyperlipidemia, tobacco abuse, COPD, BPH, and GERD.  He was previously noted to have three-vessel coronary calcification on chest CT in 2017 prompting stress testing in March 2017, which was abnormal with mid anterior, anteroseptal, and apical anterior ischemia.  This was followed by diagnostic catheterization which showed a heavily calcified LAD with moderate calcification of the RCA but no significant coronary artery disease.  He has been medically managed for somewhat atypical chest pain and was last seen in cardiology clinic in August 2019.  He recently saw his primary care provider and reported episodic chest pain for which she had taken 2 nitroglycerin since his prior visit.  Today, he notes episodic, usually exertional chest heaviness associated with fatigue occurring once every 1 to 3 months, lasting 5 to 10 minutes, and resolving with rest.  Occasionally, he will take a sublingual nitroglycerin tablet, may be 5 or so a year.  He says that overall, his chest pain symptoms are stable dating back to the time of his catheterization in 2017.  Most times, he can exert himself without any issues but every once in a while he might notice chest tightness.  He otherwise does not experience dyspnea on exertion, palpitations, PND, orthopnea, dizziness, syncope, edema, or early satiety.  He notes reasonably good compliance with his medications though says he sometimes misses his Lipitor at night if he is out late.  He continues to smoke  a pack to a pack and a half of cigarettes every day.  Home Medications    Prior to Admission medications   Medication Sig Start Date End Date Taking? Authorizing Provider  aspirin 81 MG tablet Take 1 tablet by mouth daily. 11/10/08   [provider]  atorvastatin (LIPITOR) 40 MG tablet Take 1 tablet (40 mg total) by mouth daily. 08/17/20   Steele Sizer,  MD  cholecalciferol (VITAMIN D) 25 MCG (1000 UNIT) tablet Take by mouth. 01/16/20   [provider]  Cyanocobalamin (B-12) 1000 MCG TBCR Take 1 tablet by mouth daily. 10/03/11   [provider]  gabapentin (NEURONTIN) 300 MG capsule Take 1 capsule (300 mg total) by mouth 2 (two) times daily. 08/17/20   Steele Sizer, MD  MULTIPLE VITAMIN PO Take 1 tablet by mouth daily. 03/16/09   [provider]  nitroGLYCERIN (NITROSTAT) 0.4 MG SL tablet PLACE ONE TABLET UNDER TONGUE AS NEEDED FOR CHEST PAIN EVERY 5 MINUTES 03/17/20   Ancil Boozer, Drue Stager, MD  pantoprazole (PROTONIX) 40 MG tablet Take 1 tablet (40 mg total) by mouth daily. 08/17/20   Steele Sizer, MD  tamsulosin (FLOMAX) 0.4 MG CAPS capsule Take 1 capsule (0.4 mg total) by mouth daily. 01/30/20   McGowan, Larene Beach A, PA-C  umeclidinium-vilanterol (ANORO ELLIPTA) 62.5-25 MCG/INH AEPB INHALE 1 PUFF BY MOUTH INTO LUNGS DAILY 08/17/20   Steele Sizer, MD  Vitamin D, Cholecalciferol, 1000 UNITS CAPS Take 1 tablet by mouth daily.    [provider]    Review of Systems    Stable exertional angina dating back several years.  He denies dyspnea, palpitations, PND, orthopnea, dizziness, syncope, edema, or early satiety.  All other systems reviewed and are otherwise negative except as noted above.  Physical Exam    VS:  BP 122/68   Pulse 80   Ht 5\' 11"  (1.803 m)   Wt 167 lb (75.8 kg)   BMI 23.29 kg/m  , BMI Body mass index is 23.29 kg/m. GEN: Well nourished, well developed, in no acute distress. HEENT: normal. Neck: Supple, no JVD, carotid bruits, or masses. Cardiac: RRR, no murmurs, rubs, or gallops. No clubbing, cyanosis, edema.  Radials/DP/PT 2+ and equal bilaterally.  Respiratory:  Respirations regular and unlabored, clear to auscultation bilaterally. GI: Soft, nontender, nondistended, BS + x 4. MS: no deformity or atrophy. Skin: warm and dry, no rash. Neuro:  Strength and sensation are intact. Psych: Normal  affect.  Accessory Clinical Findings    ECG personally reviewed by me today -regular sinus rhythm, 78, first-degree AV block - no acute changes.  Lab Results  Component Value Date   WBC 7.9 08/17/2020   HGB 15.1 08/17/2020   HCT 45.5 08/17/2020   MCV 92.9 08/17/2020   PLT 152 08/17/2020   Lab Results  Component Value Date   CREATININE 1.52 (H) 08/17/2020   BUN 10 08/17/2020   NA 140 08/17/2020   K 4.2 08/17/2020   CL 106 08/17/2020   CO2 30 08/17/2020   Lab Results  Component Value Date   ALT 14 08/17/2020   AST 21 08/17/2020   ALKPHOS 62 09/20/2016   BILITOT 1.1 08/17/2020   Lab Results  Component Value Date   CHOL 103 08/17/2020   HDL 57 08/17/2020   LDLCALC 30 08/17/2020   TRIG 81 08/17/2020   CHOLHDL 1.8 08/17/2020     Assessment & Plan    1.  Stable angina/nonobstructive CAD: Patient presents today to reestablish care.  He has been  having intermittent predominantly exertional substernal chest heaviness associated with fatigue occurring once every 1 to 3 months, lasting 5 to 10 minutes, and resolving with rest or occasionally sublingual nitroglycerin.  He notes that he has had similar, stable symptoms dating back to at least the time of his diagnostic catheterization in 2017.  He does not think that symptoms have progressed any.  We agreed to initiate isosorbide mononitrate 15 mg daily today to see if we could prevent symptoms in the future.  He understands that if symptoms progress, we would have a low threshold to pursue repeat diagnostic catheterization.  Stress testing not likely to be beneficial in the setting of prior false positive study.  He continues to smoke a pack and a half daily and I strongly encouraged him to cease.  He remains on aspirin and statin therapy with an LDL of 30 last week.  2.  Essential hypertension: Blood pressure stable in the absence of antihypertensives.  3.  Hyperlipidemia: LDL 30 on statin therapy with normal LFTs on May 10.  4.   Tobacco abuse: Smoking 1 to 1-1/2 packs/day.  Complete cessation advised.  He is not currently contemplating quitting.  5.  COPD: No active wheezing.  Inhaler therapy per primary care.  6.  CKD III:  Creat stable on recent labs.  7.  Disposition: Follow-up in 3 months or sooner if necessary.  Murray Hodgkins, NP 08/26/2020, 12:57 PM

## 2020-09-02 ENCOUNTER — Encounter: Payer: Self-pay | Admitting: *Deleted

## 2020-09-13 ENCOUNTER — Other Ambulatory Visit: Payer: Self-pay | Admitting: Family Medicine

## 2020-09-13 DIAGNOSIS — E538 Deficiency of other specified B group vitamins: Secondary | ICD-10-CM

## 2020-09-13 DIAGNOSIS — G63 Polyneuropathy in diseases classified elsewhere: Secondary | ICD-10-CM

## 2020-10-01 ENCOUNTER — Other Ambulatory Visit: Payer: Self-pay | Admitting: Family Medicine

## 2020-10-01 DIAGNOSIS — Z8711 Personal history of peptic ulcer disease: Secondary | ICD-10-CM

## 2020-10-01 DIAGNOSIS — K219 Gastro-esophageal reflux disease without esophagitis: Secondary | ICD-10-CM

## 2020-10-14 ENCOUNTER — Other Ambulatory Visit: Payer: Self-pay

## 2020-10-14 ENCOUNTER — Ambulatory Visit (INDEPENDENT_AMBULATORY_CARE_PROVIDER_SITE_OTHER): Payer: Medicare Other

## 2020-10-14 VITALS — BP 130/70 | HR 84 | Temp 98.1°F | Resp 16 | Ht 71.0 in | Wt 162.2 lb

## 2020-10-14 DIAGNOSIS — Z0000 Encounter for general adult medical examination without abnormal findings: Secondary | ICD-10-CM

## 2020-10-14 NOTE — Progress Notes (Signed)
Subjective:   Daviyon Widmayer Broz is a 75 y.o. male who presents for Medicare Annual/Subsequent preventive examination.  Review of Systems     Cardiac Risk Factors include: advanced age (>8men, >37 women);dyslipidemia;male gender;smoking/ tobacco exposure;hypertension     Objective:    Today's Vitals   10/14/20 0844  BP: 130/70  Pulse: 84  Resp: 16  Temp: 98.1 F (36.7 C)  TempSrc: Oral  SpO2: 97%  Weight: 162 lb 3.2 oz (73.6 kg)  Height: 5\' 11"  (1.803 m)   Body mass index is 22.62 kg/m.  Advanced Directives 10/14/2020 11/26/2019 10/14/2019 10/10/2018 11/14/2017 10/04/2017 09/20/2016  Does Patient Have a Medical Advance Directive? Yes No No No No No No  Does patient want to make changes to medical advance directive? Yes (MAU/Ambulatory/Procedural Areas - Information given) - - - - - -  Would patient like information on creating a medical advance directive? - - Yes (MAU/Ambulatory/Procedural Areas - Information given) No - Patient declined No - Patient declined Yes (MAU/Ambulatory/Procedural Areas - Information given) -    Current Medications (verified) Outpatient Encounter Medications as of 10/14/2020  Medication Sig   aspirin 81 MG tablet Take 1 tablet by mouth daily.   atorvastatin (LIPITOR) 40 MG tablet Take 1 tablet (40 mg total) by mouth daily.   Cyanocobalamin (B-12) 1000 MCG TBCR Take 1 tablet by mouth daily.   gabapentin (NEURONTIN) 300 MG capsule Take 1 capsule (300 mg total) by mouth 2 (two) times daily.   isosorbide mononitrate (IMDUR) 30 MG 24 hr tablet Take 0.5 tablets (15 mg total) by mouth daily.   MULTIPLE VITAMIN PO Take 1 tablet by mouth daily.   nitroGLYCERIN (NITROSTAT) 0.4 MG SL tablet PLACE ONE TABLET UNDER TONGUE AS NEEDED FOR CHEST PAIN EVERY 5 MINUTES   pantoprazole (PROTONIX) 40 MG tablet TAKE 1 TABLET(40 MG) BY MOUTH DAILY   tamsulosin (FLOMAX) 0.4 MG CAPS capsule Take 1 capsule (0.4 mg total) by mouth daily.   umeclidinium-vilanterol (ANORO ELLIPTA) 62.5-25  MCG/INH AEPB INHALE 1 PUFF BY MOUTH INTO LUNGS DAILY   Vitamin D, Cholecalciferol, 1000 UNITS CAPS Take 1 tablet by mouth daily.   [DISCONTINUED] cholecalciferol (VITAMIN D) 25 MCG (1000 UNIT) tablet Take by mouth.   No facility-administered encounter medications on file as of 10/14/2020.    Allergies (verified) Patient has no known allergies.   History: Past Medical History:  Diagnosis Date   Benign prostatic hypertrophy    Chronic kidney disease, stage III (moderate) (HCC)    COPD (chronic obstructive pulmonary disease) (HCC)    Cramps, muscle, general    Elevated ferritin    Emphysema of lung (HCC)    GERD (gastroesophageal reflux disease)    History of hypercalcemia    Hyperlipidemia    Hypertension    Incomplete bladder emptying    Nocturia    Nonobstructive CAD    a. 06/2015 MV: mid ant/antseptal/apical anterior ischemia; b. 06/2015 Cath: LM nl, LAD Ca2+, LCX min irregs, OM1/2/3 nl, RCA mod Ca2+, RPDA/RPAV/RPL1,2,3 nl. EF 55-65%.   Peripheral polyneuropathy    Rising PSA level    Tobacco use    Urgency of micturation    Vitamin B 12 deficiency    Past Surgical History:  Procedure Laterality Date   CARDIAC CATHETERIZATION N/A 07/02/2015   Procedure: Left Heart Cath and Coronary Angiography;  Surgeon: Wellington Hampshire, MD;  Location: [facility];  Service: Cardiovascular;  Laterality: N/A;   COLONOSCOPY  2011   [facility]   COLONOSCOPY WITH  PROPOFOL N/A 11/26/2019   Procedure: COLONOSCOPY WITH PROPOFOL;  Surgeon: Robert Bellow, MD;  Location: [facility];  Service: Endoscopy;  Laterality: N/A;   ESOPHAGOGASTRODUODENOSCOPY (EGD) WITH PROPOFOL N/A 11/14/2017   Procedure: ESOPHAGOGASTRODUODENOSCOPY (EGD) WITH PROPOFOL;  Surgeon: Robert Bellow, MD;  Location: [facility] ENDOSCOPY;  Service: Endoscopy;  Laterality: N/A;   throat biopsy     lump removed   Family History  Problem Relation Age of Onset   Hypertension Mother    CVA Mother    Cancer Father        Lung    Mental illness Brother        1-Schizophrenia   GER disease Son    Healthy Sister    Heart Problems Brother    Gallbladder disease Brother    Healthy Sister    Gallbladder disease Brother    Hypertension Brother    Social History   Socioeconomic History   Marital status: Married    Spouse name: Claudine   Number of children: 3   Years of education: some college   Highest education level: 12th grade  Occupational History   Occupation: Retired  Tobacco Use   Smoking status: Every Day    Packs/day: 1.50    Years: 44.00    Pack years: 66.00    Types: Cigarettes    Start date: 10/04/1973   Smokeless tobacco: Never   Tobacco comments:    [place] Smoking Cessation Information given   Vaping Use   Vaping Use: Never used  Substance and Sexual Activity   Alcohol use: No    Alcohol/week: 0.0 standard drinks   Drug use: No   Sexual activity: Not Currently    Partners: Female  Other Topics Concern   Not on file  Social History Narrative   Not on file   Social Determinants of Health   Financial Resource Strain: Low Risk    Difficulty of Paying Living Expenses: Not hard at all  Food Insecurity: No Food Insecurity   Worried About Charity fundraiser in the Last Year: Never true   [place] in the Last Year: Never true  Transportation Needs: No Transportation Needs   Lack of Transportation (Medical): No   Lack of Transportation (Non-Medical): No  Physical Activity: Inactive   Days of Exercise per Week: 0 days   Minutes of Exercise per Session: 0 min  Stress: No Stress Concern Present   Feeling of Stress : Not at all  Social Connections: Moderately Isolated   Frequency of Communication with Friends and Family: More than three times a week   Frequency of Social Gatherings with Friends and Family: More than three times a week   Attends Religious Services: Never   Marine scientist or Organizations: No   Attends Music therapist: Never   Marital  Status: Married    Tobacco Counseling Ready to quit: Yes Counseling given: Yes Tobacco comments: [place] Smoking Cessation Information given    Clinical Intake:  Pre-visit preparation completed: Yes  Pain : No/denies pain     BMI - recorded: 22.62 Nutritional Status: BMI of 19-24  Normal Nutritional Risks: None Diabetes: No  How often do you need to have someone help you when you read instructions, pamphlets, or other written materials from your doctor or pharmacy?: 1 - Never   Interpreter Needed?: No  Information entered by :: Clemetine Marker LPN   Activities of Daily Living In your present state of health, do  you have any difficulty performing the following activities: 10/14/2020 08/17/2020  Hearing? N N  Comment declines hearing aids -  Vision? N N  Difficulty concentrating or making decisions? N N  Walking or climbing stairs? N N  Dressing or bathing? N N  Doing errands, shopping? N N  Preparing Food and eating ? N -  Using the Toilet? N -  In the past six months, have you accidently leaked urine? N -  Do you have problems with loss of bowel control? N -  Managing your Medications? N -  Managing your Finances? N -  Housekeeping or managing your Housekeeping? N -  Some recent data might be hidden    Patient Care Team: Steele Sizer, MD as PCP - General (Family Medicine) Wellington Hampshire, MD as Consulting Physician (Cardiology) Bary Castilla Forest Gleason, MD as Consulting Physician (General Surgery) Laneta Simmers as Physician Assistant (Urology)  Indicate any recent Medical Services you may have received from other than [facility] providers in the past year (date may be approximate).     Assessment:   This is a routine wellness examination for Loretta.  Hearing/Vision screen Hearing Screening - Comments:: Pt denies hearing difficulty Vision Screening - Comments:: [facility] screen[facility]  Dietary issues and exercise activities  discussed: Current Exercise Habits: The patient does not participate in regular exercise at present, Exercise limited by: respiratory conditions(s)   Goals Addressed             This Visit's Progress    DIET - INCREASE WATER INTAKE   On track    Recommend to drink at least 6-8 8oz glasses of water per day.      Quit Smoking       If you wish to quit smoking, help is available. For free tobacco cessation program offerings call the [facility] at [phone] or Live Well Line at (817)595-7154. You may also visit [email or web address] or email [email or web address] for more information on other programs.          Depression Screen PHQ 2/9 Scores 10/14/2020 08/17/2020 02/17/2020 10/14/2019 08/13/2019 02/13/2019 10/10/2018  PHQ - 2 Score 0 0 0 0 0 0 0  PHQ- 9 Score - - - - 0 0 0    Fall Risk Fall Risk  10/14/2020 08/17/2020 02/17/2020 10/14/2019 08/21/2019  Falls in the past year? 0 0 0 0 0  Number falls in past yr: 0 0 0 0 0  Injury with Fall? 0 0 0 0 0  Risk for fall due to : No Fall Risks - - No Fall Risks -  Risk for fall due to: Comment - - - - -  Follow up Falls prevention discussed Falls evaluation completed - Falls prevention discussed -    FALL RISK PREVENTION PERTAINING TO THE HOME:  Any stairs in or around the home? Yes  If so, are there any without handrails? No  Home free of loose throw rugs in walkways, pet beds, electrical cords, etc? Yes  Adequate lighting in your home to reduce risk of falls? Yes   ASSISTIVE DEVICES UTILIZED TO PREVENT FALLS:  Life alert? No  Use of a cane, walker or w/c? No  Grab bars in the bathroom? No  Shower chair or bench in shower? No  Elevated toilet seat or a handicapped toilet? No   TIMED UP AND GO:  Was the test performed? Yes .  Length of time to ambulate 10 feet: 5  sec.   Gait steady and fast without use of assistive device  Cognitive Function: Normal cognitive status assessed by direct observation by this Nurse  Health Advisor. No abnormalities found.       6CIT Screen 10/14/2019 10/10/2018 10/04/2017  What Year? 0 points 0 points 0 points  What month? 0 points 0 points 0 points  What time? 0 points 0 points 0 points  Count back from 20 0 points 0 points 0 points  Months in reverse 0 points 0 points 0 points  Repeat phrase 0 points 0 points 2 points  Total Score 0 0 2    Immunizations Immunization History  Administered Date(s) Administered   Fluad Quad(high Dose 65+) 11/29/2018, 02/17/2020   Influenza Split 01/13/2009   Influenza, High Dose Seasonal PF 01/18/2016, 01/22/2017, 01/22/2018   Influenza, Seasonal, Injecte, Preservative Fre 01/31/2011   Influenza,inj,Quad PF,6+ Mos 02/21/2013, 03/02/2014, 11/25/2014   Influenza-Unspecified 03/02/2014   PFIZER(Purple Top)SARS-COV-2 Vaccination 06/05/2019, 07/02/2019, 03/25/2020   Pneumococcal Conjugate-13 06/30/2014   Pneumococcal Polysaccharide-23 06/14/2009, 10/14/2015   Pneumococcal-Unspecified 06/14/2009   Td 11/10/2008   Tdap 11/10/2008   Zoster, Live 10/03/2011    TDAP status: Due, Education has been provided regarding the importance of this vaccine. Advised may receive this vaccine at local pharmacy or Health Dept. Aware to provide a copy of the vaccination record if obtained from local pharmacy or Health Dept. Verbalized acceptance and understanding.  Flu Vaccine status: Up to date  Pneumococcal vaccine status: Up to date  Covid-19 vaccine status: Completed vaccines  Qualifies for Shingles Vaccine? Yes   Zostavax completed Yes   Shingrix Completed?: No.    Education has been provided regarding the importance of this vaccine. Patient has been advised to call insurance company to determine out of pocket expense if they have not yet received this vaccine. Advised may also receive vaccine at local pharmacy or Health Dept. Verbalized acceptance and understanding.  Screening Tests Health Maintenance  Topic Date Due   Zoster Vaccines-  Shingrix (1 of 2) Never done   COVID-19 Vaccine (4 - Booster for Pfizer series) 07/24/2020   TETANUS/TDAP  02/15/2021 (Originally 12/08/2018)   INFLUENZA VACCINE  11/08/2020   COLONOSCOPY (Pts 45-90yrs Insurance coverage will need to be confirmed)  11/26/2022   Hepatitis C Screening  Completed   PNA vac Low Risk Adult  Completed   HPV VACCINES  Aged Out    Health Maintenance  Health Maintenance Due  Topic Date Due   Zoster Vaccines- Shingrix (1 of 2) Never done   COVID-19 Vaccine (4 - Booster for Pfizer series) 07/24/2020    Colorectal cancer screening: Type of screening: Colonoscopy. Completed 11/26/19. Repeat every 3 years  Lung Cancer Screening: (Low Dose CT Chest recommended if Age 33-80 years, 30 pack-year currently smoking OR have quit w/in 15years.) does qualify. Completed 08/25/20  Additional Screening:  Hepatitis C Screening: does qualify; Completed 02/06/12  Vision Screening: Recommended annual ophthalmology exams for early detection of glaucoma and other disorders of the eye. Is the patient up to date with their annual eye exam?  Yes  Who is the provider or what is the name of the office in which the patient attends annual eye exams? [facility].   Dental Screening: Recommended annual dental exams for proper oral hygiene  Community Resource Referral / Chronic Care Management: CRR required this visit?  No   CCM required this visit?  No      Plan:     I have personally reviewed and  noted the following in the patient's chart:   Medical and social history Use of alcohol, tobacco or illicit drugs  Current medications and supplements including opioid prescriptions. Patient is not currently taking opioid prescriptions. Functional ability and status Nutritional status Physical activity Advanced directives List of other physicians Hospitalizations, surgeries, and ER visits in previous 12 months Vitals Screenings to include cognitive, depression, and  falls Referrals and appointments  In addition, I have reviewed and discussed with patient certain preventive protocols, quality metrics, and best practice recommendations. A written personalized care plan for preventive services as well as general preventive health recommendations were provided to patient.     Clemetine Marker, LPN   08/09/8982   Nurse Notes: none

## 2020-10-14 NOTE — Patient Instructions (Signed)
Mr. Kevin Roberts , Thank you for taking time to come for your Medicare Wellness Visit. I appreciate your ongoing commitment to your health goals. Please review the following plan we discussed and let me know if I can assist you in the future.   Screening recommendations/referrals: Colonoscopy: done 11/26/19. Repeat in 2024 Recommended yearly ophthalmology/optometry visit for glaucoma screening and checkup Recommended yearly dental visit for hygiene and checkup  Vaccinations: Influenza vaccine: done 02/17/20 Pneumococcal vaccine: done 10/14/15 Tdap vaccine: DUE Shingles vaccine: Shingrix discussed. Please contact your pharmacy for coverage information.  Covid-19:  done 06/05/19, 07/02/19 & 03/25/20  Advanced directives: Advance directive discussed with you today. I have provided a copy for you to complete at home and have notarized. Once this is complete please bring a copy in to our office so we can scan it into your chart.   Conditions/risks identified: If you wish to quit smoking, help is available. For free tobacco cessation program offerings call the [facility] at [phone] or Live Well Line at (754)834-1208. You may also visit [email or web address] or email [email or web address] for more information on other programs.   Next appointment: Follow up in one year for your annual wellness visit.   Preventive Care 59 Years and Older, Male Preventive care refers to lifestyle choices and visits with your health care provider that can promote health and wellness. What does preventive care include? A yearly physical exam. This is also called an annual well check. Dental exams once or twice a year. Routine eye exams. Ask your health care provider how often you should have your eyes checked. Personal lifestyle choices, including: Daily care of your teeth and gums. Regular physical activity. Eating a healthy diet. Avoiding tobacco and drug use. Limiting alcohol use. Practicing safe  sex. Taking low doses of aspirin every day. Taking vitamin and mineral supplements as recommended by your health care provider. What happens during an annual well check? The services and screenings done by your health care provider during your annual well check will depend on your age, overall health, lifestyle risk factors, and family history of disease. Counseling  Your health care provider may ask you questions about your: Alcohol use. Tobacco use. Drug use. Emotional well-being. Home and relationship well-being. Sexual activity. Eating habits. History of falls. Memory and ability to understand (cognition). Work and work Statistician. Screening  You may have the following tests or measurements: Height, weight, and BMI. Blood pressure. Lipid and cholesterol levels. These may be checked every 5 years, or more frequently if you are over 35 years old. Skin check. Lung cancer screening. You may have this screening every year starting at age 98 if you have a 30-pack-year history of smoking and currently smoke or have quit within the past 15 years. Fecal occult blood test (FOBT) of the stool. You may have this test every year starting at age 21. Flexible sigmoidoscopy or colonoscopy. You may have a sigmoidoscopy every 5 years or a colonoscopy every 10 years starting at age 29. Prostate cancer screening. Recommendations will vary depending on your family history and other risks. Hepatitis C blood test. Hepatitis B blood test. Sexually transmitted disease (STD) testing. Diabetes screening. This is done by checking your blood sugar (glucose) after you have not eaten for a while (fasting). You may have this done every 1-3 years. Abdominal aortic aneurysm (AAA) screening. You may need this if you are a current or former smoker. Osteoporosis. You may be screened starting at age 64 if you are  at high risk. Talk with your health care provider about your test results, treatment options, and if  necessary, the need for more tests. Vaccines  Your health care provider may recommend certain vaccines, such as: Influenza vaccine. This is recommended every year. Tetanus, diphtheria, and acellular pertussis (Tdap, Td) vaccine. You may need a Td booster every 10 years. Zoster vaccine. You may need this after age 45. Pneumococcal 13-valent conjugate (PCV13) vaccine. One dose is recommended after age 10. Pneumococcal polysaccharide (PPSV23) vaccine. One dose is recommended after age 61. Talk to your health care provider about which screenings and vaccines you need and how often you need them. This information is not intended to replace advice given to you by your health care provider. Make sure you discuss any questions you have with your health care provider. Document Released: 04/23/2015 Document Revised: 12/15/2015 Document Reviewed: 01/26/2015 Elsevier Interactive Patient Education  2017 [place] Prevention in the Home Falls can cause injuries. They can happen to people of all ages. There are many things you can do to make your home safe and to help prevent falls. What can I do on the outside of my home? Regularly fix the edges of walkways and driveways and fix any cracks. Remove anything that might make you trip as you walk through a door, such as a raised step or threshold. Trim any bushes or trees on the path to your home. Use bright outdoor lighting. Clear any walking paths of anything that might make someone trip, such as rocks or tools. Regularly check to see if handrails are loose or broken. Make sure that both sides of any steps have handrails. Any raised decks and porches should have guardrails on the edges. Have any leaves, snow, or ice cleared regularly. Use sand or salt on walking paths during winter. Clean up any spills in your garage right away. This includes oil or grease spills. What can I do in the bathroom? Use night lights. Install grab bars by the toilet  and in the tub and shower. Do not use towel bars as grab bars. Use non-skid mats or decals in the tub or shower. If you need to sit down in the shower, use a plastic, non-slip stool. Keep the floor dry. Clean up any water that spills on the floor as soon as it happens. Remove soap buildup in the tub or shower regularly. Attach bath mats securely with double-sided non-slip rug tape. Do not have throw rugs and other things on the floor that can make you trip. What can I do in the bedroom? Use night lights. Make sure that you have a light by your bed that is easy to reach. Do not use any sheets or blankets that are too big for your bed. They should not hang down onto the floor. Have a firm chair that has side arms. You can use this for support while you get dressed. Do not have throw rugs and other things on the floor that can make you trip. What can I do in the kitchen? Clean up any spills right away. Avoid walking on wet floors. Keep items that you use a lot in easy-to-reach places. If you need to reach something above you, use a strong step stool that has a grab bar. Keep electrical cords out of the way. Do not use floor polish or wax that makes floors slippery. If you must use wax, use non-skid floor wax. Do not have throw rugs and other things on the floor  that can make you trip. What can I do with my stairs? Do not leave any items on the stairs. Make sure that there are handrails on both sides of the stairs and use them. Fix handrails that are broken or loose. Make sure that handrails are as long as the stairways. Check any carpeting to make sure that it is firmly attached to the stairs. Fix any carpet that is loose or worn. Avoid having throw rugs at the top or bottom of the stairs. If you do have throw rugs, attach them to the floor with carpet tape. Make sure that you have a light switch at the top of the stairs and the bottom of the stairs. If you do not have them, ask someone to add  them for you. What else can I do to help prevent falls? Wear shoes that: Do not have high heels. Have rubber bottoms. Are comfortable and fit you well. Are closed at the toe. Do not wear sandals. If you use a stepladder: Make sure that it is fully opened. Do not climb a closed stepladder. Make sure that both sides of the stepladder are locked into place. Ask someone to hold it for you, if possible. Clearly mark and make sure that you can see: Any grab bars or handrails. First and last steps. Where the edge of each step is. Use tools that help you move around (mobility aids) if they are needed. These include: Canes. Walkers. Scooters. Crutches. Turn on the lights when you go into a dark area. Replace any light bulbs as soon as they burn out. Set up your furniture so you have a clear path. Avoid moving your furniture around. If any of your floors are uneven, fix them. If there are any pets around you, be aware of where they are. Review your medicines with your doctor. Some medicines can make you feel dizzy. This can increase your chance of falling. Ask your doctor what other things that you can do to help prevent falls. This information is not intended to replace advice given to you by your health care provider. Make sure you discuss any questions you have with your health care provider. Document Released: 01/21/2009 Document Revised: 09/02/2015 Document Reviewed: 05/01/2014 Elsevier Interactive Patient Education  2017 [employer].

## 2020-11-05 ENCOUNTER — Other Ambulatory Visit: Payer: Self-pay | Admitting: Family Medicine

## 2020-11-05 DIAGNOSIS — Z23 Encounter for immunization: Secondary | ICD-10-CM

## 2020-11-05 MED ORDER — SHINGRIX 50 MCG/0.5ML IM SUSR
0.5000 mL | Freq: Once | INTRAMUSCULAR | 1 refills | Status: AC
Start: 2020-11-05 — End: 2020-11-05

## 2020-12-02 ENCOUNTER — Encounter: Payer: Self-pay | Admitting: Nurse Practitioner

## 2020-12-02 ENCOUNTER — Other Ambulatory Visit: Payer: Self-pay

## 2020-12-02 ENCOUNTER — Ambulatory Visit (INDEPENDENT_AMBULATORY_CARE_PROVIDER_SITE_OTHER): Payer: Medicare Other | Admitting: Nurse Practitioner

## 2020-12-02 VITALS — BP 120/64 | HR 82 | Ht 71.0 in | Wt 160.0 lb

## 2020-12-02 DIAGNOSIS — I2510 Atherosclerotic heart disease of native coronary artery without angina pectoris: Secondary | ICD-10-CM

## 2020-12-02 DIAGNOSIS — N183 Chronic kidney disease, stage 3 unspecified: Secondary | ICD-10-CM | POA: Diagnosis not present

## 2020-12-02 DIAGNOSIS — I25118 Atherosclerotic heart disease of native coronary artery with other forms of angina pectoris: Secondary | ICD-10-CM

## 2020-12-02 DIAGNOSIS — E782 Mixed hyperlipidemia: Secondary | ICD-10-CM

## 2020-12-02 DIAGNOSIS — R5383 Other fatigue: Secondary | ICD-10-CM

## 2020-12-02 DIAGNOSIS — Z720 Tobacco use: Secondary | ICD-10-CM

## 2020-12-02 NOTE — Patient Instructions (Signed)
Medication Instructions:  - Your physician has recommended you make the following change in your medication:   1) STOP imdur (isosorbide MN)  *If you need a refill on your cardiac medications before your next appointment, please call your pharmacy*   Lab Work: - none ordered  If you have labs (blood work) drawn today and your tests are completely normal, you will receive your results only by: [place] (if you have MyChart) OR A paper copy in the mail If you have any lab test that is abnormal or we need to change your treatment, we will call you to review the results.   Testing/Procedures: - none ordered   Follow-Up: At [facility], you and your health needs are our priority.  As part of our continuing mission to provide you with exceptional heart care, we have created designated Provider Care Teams.  These Care Teams include your primary Cardiologist (physician) and Advanced Practice Providers (APPs -  Physician Assistants and Nurse Practitioners) who all work together to provide you with the care you need, when you need it.  We recommend signing up for the patient portal called "MyChart".  Sign up information is provided on this After Visit Summary.  MyChart is used to connect with patients for Virtual Visits (Telemedicine).  Patients are able to view lab/test results, encounter notes, upcoming appointments, etc.  Non-urgent messages can be sent to your provider as well.   To learn more about what you can do with MyChart, go to [email or web address].    Your next appointment:   6 month(s)  The format for your next appointment:   In Person  Provider:   You may see [name], MD or one of the following Advanced Practice Providers on your designated Care Team:   Murray Hodgkins, NP    Other Instructions N/a

## 2020-12-02 NOTE — Progress Notes (Signed)
Office Visit    Patient Name: Kevin Roberts Safer Date of Encounter: 12/02/2020  Primary Care Provider:  Steele Sizer, MD Primary Cardiologist:  [name], MD  Chief Complaint    75 year old male with history of nonobstructive CAD, hypertension, hyperlipidemia, tobacco abuse, COPD, BPH, and GERD, who presents for follow-up related to chest pain.  Past Medical History    Past Medical History:  Diagnosis Date   Benign prostatic hypertrophy    Chronic kidney disease, stage III (moderate) (HCC)    COPD (chronic obstructive pulmonary disease) (HCC)    Cramps, muscle, general    Elevated ferritin    Emphysema of lung (HCC)    GERD (gastroesophageal reflux disease)    History of hypercalcemia    Hyperlipidemia    Hypertension    Incomplete bladder emptying    Nocturia    Nonobstructive CAD    a. 06/2015 MV: mid ant/antseptal/apical anterior ischemia; b. 06/2015 Cath: LM nl, LAD Ca2+, LCX min irregs, OM1/2/3 nl, RCA mod Ca2+, RPDA/RPAV/RPL1,2,3 nl. EF 55-65%.   Peripheral polyneuropathy    Rising PSA level    Tobacco use    Urgency of micturation    Vitamin B 12 deficiency    Past Surgical History:  Procedure Laterality Date   CARDIAC CATHETERIZATION N/A 07/02/2015   Procedure: Left Heart Cath and Coronary Angiography;  Surgeon: Wellington Hampshire, MD;  Location: [facility];  Service: Cardiovascular;  Laterality: N/A;   COLONOSCOPY  2011   JWB   COLONOSCOPY WITH PROPOFOL N/A 11/26/2019   Procedure: COLONOSCOPY WITH PROPOFOL;  Surgeon: Robert Bellow, MD;  Location: [facility];  Service: Endoscopy;  Laterality: N/A;   ESOPHAGOGASTRODUODENOSCOPY (EGD) WITH PROPOFOL N/A 11/14/2017   Procedure: ESOPHAGOGASTRODUODENOSCOPY (EGD) WITH PROPOFOL;  Surgeon: Robert Bellow, MD;  Location: [facility];  Service: Endoscopy;  Laterality: N/A;   throat biopsy     lump removed    Allergies  No Known Allergies  History of Present Illness    75 year old male with  the above past medical history including nonobstructive CAD, hypertension, hyperlipidemia, tobacco abuse, COPD, BPH, and GERD.  He was previously noted to have three-vessel coronary calcification on chest CT in 2017, prompting stress testing March 2017, which was abnormal with mid anterior, anteroseptal, and apical anterior ischemia.  This was followed by diagnostic catheterization which showed heavily calcified LAD with moderate calcification of the RCA but no significant coronary disease.  He has been medically managed for somewhat atypical chest pain since.  He was last seen in clinic in May of this year at which time he continued to report episodic, usually exertional chest heaviness associated fatigue once every 1 to 3 months.  Overall, he felt as though symptoms were stable dating back to at least his diagnostic catheterization 2017.  I initiated isosorbide mononitrate 15 mg daily.  Since his last visit, he is only had 1 episode of chest discomfort.  He notes that a typical day for him is getting up sometime around 4 AM, going out running errands between 8 and noon, and then taking a nap between 1 and 3 PM.  He still does things around his house and also yard work without symptoms or limitations.  He is not sure if the isosorbide has helped much simply because symptoms occur so infrequently in the first place.  In fact, he notes that he he has been having some fatigue since starting isosorbide.  He feels as though he is well rested when he  wakes in the morning and is unaware of any prior history of snoring.  He is not currently interested in a sleep study.  He has chronic, stable dyspnea on exertion.  He denies palpitations, PND, orthopnea, dizziness, syncope, edema, or early satiety.  Home Medications    Current Outpatient Medications  Medication Sig Dispense Refill   aspirin 81 MG tablet Take 1 tablet by mouth daily.     atorvastatin (LIPITOR) 40 MG tablet Take 1 tablet (40 mg total) by mouth daily.  90 tablet 3   Cyanocobalamin (B-12) 1000 MCG TBCR Take 1 tablet by mouth daily.     gabapentin (NEURONTIN) 300 MG capsule Take 1 capsule (300 mg total) by mouth 2 (two) times daily. 180 capsule 3   isosorbide mononitrate (IMDUR) 30 MG 24 hr tablet Take 0.5 tablets (15 mg total) by mouth daily. 45 tablet 3   MULTIPLE VITAMIN PO Take 1 tablet by mouth daily.     nitroGLYCERIN (NITROSTAT) 0.4 MG SL tablet PLACE ONE TABLET UNDER TONGUE AS NEEDED FOR CHEST PAIN EVERY 5 MINUTES 30 tablet 2   pantoprazole (PROTONIX) 40 MG tablet TAKE 1 TABLET(40 MG) BY MOUTH DAILY 90 tablet 1   tamsulosin (FLOMAX) 0.4 MG CAPS capsule Take 1 capsule (0.4 mg total) by mouth daily. 90 capsule 3   umeclidinium-vilanterol (ANORO ELLIPTA) 62.5-25 MCG/INH AEPB INHALE 1 PUFF BY MOUTH INTO LUNGS DAILY 60 each 5   Vitamin D, Cholecalciferol, 1000 UNITS CAPS Take 1 tablet by mouth daily.     No current facility-administered medications for this visit.     Review of Systems    Only 1 episode of chest discomfort in the past 3 months.  He has had some fatigue since starting isosorbide mononitrate.  He has chronic, stable dyspnea exertion.  He denies palpitations, PND, orthopnea, dizziness, syncope, edema, or early satiety.  All other systems reviewed and are otherwise negative except as noted above.  Physical Exam    VS:  BP 120/64 (BP Location: Left Arm, Patient Position: Sitting, Cuff Size: Normal)   Pulse 82   Ht '5\' 11"'$  (1.803 m)   Wt 160 lb (72.6 kg)   SpO2 98%   BMI 22.32 kg/m  , BMI Body mass index is 22.32 kg/m.     GEN: Thin, in no acute distress. HEENT: normal. Neck: Supple, no JVD, carotid bruits, or masses. Cardiac: RRR, no murmurs, rubs, or gallops. No clubbing, cyanosis, edema.  Radials/PT 2+ and equal bilaterally.  Respiratory:  Respirations regular and unlabored, diminished breath sounds bilaterally. GI: Soft, nontender, nondistended, BS + x 4. MS: no deformity or atrophy. Skin: warm and dry, no  rash. Neuro:  Strength and sensation are intact. Psych: Normal affect.  Accessory Clinical Findings    ECG personally reviewed by me today - regular sinus rhythm, 82, no acute ST or T changes.  Lab Results  Component Value Date   WBC 7.9 08/17/2020   HGB 15.1 08/17/2020   HCT 45.5 08/17/2020   MCV 92.9 08/17/2020   PLT 152 08/17/2020   Lab Results  Component Value Date   CREATININE 1.52 (H) 08/17/2020   BUN 10 08/17/2020   NA 140 08/17/2020   K 4.2 08/17/2020   CL 106 08/17/2020   CO2 30 08/17/2020   Lab Results  Component Value Date   ALT 14 08/17/2020   AST 21 08/17/2020   ALKPHOS 62 09/20/2016   BILITOT 1.1 08/17/2020   Lab Results  Component Value Date   CHOL 103  08/17/2020   HDL 57 08/17/2020   LDLCALC 30 08/17/2020   TRIG 81 08/17/2020   CHOLHDL 1.8 08/17/2020     Assessment & Plan    1.  Stable angina/nonobstructive CAD: Nonobstructive CAD by catheterization in 2017.  He has done well over the past 3 months with only 1 episode of chest discomfort.  He is accustomed to experiencing chest discomfort every 1 to 3 months dating back at least 5 years.  I started isosorbide mononitrate at his last visit and he has noted some fatigue since then.  We are going to discontinue.  He otherwise remains on aspirin and statin therapy.  I encouraged him to quit smoking and also increase his activity.  2.  Essential hypertension: Blood pressure is stable.  3.  Hyperlipidemia: LDL of 30 in May with normal LFTs.  Continue statin therapy.  4.  Tobacco abuse: Continues to smoke 1-1/2 packs/day.  He is not currently contemplating quitting.  Complete cessation advised.  5.  COPD: Diminished breath sounds but no active wheezing.  Inhaler therapy per primary care.  6.  Stage III chronic kidney disease: Creatinine was 1.52 on May 10, which is stable for him.  7.  Fatigue: He sometimes feels tired in the afternoon and naps.  He thinks this started mostly after starting isosorbide,  which we will discontinue.  We discussed that he may require a sleep study.  He has no prior history of snoring, is thin, and notes that he generally feels well rested when he gets up in the morning.  He would like to defer at this time.  8.  Disposition: Follow-up in clinic in 3-6 months or sooner if necessary.  Murray Hodgkins, NP 12/02/2020, 9:23 AM

## 2020-12-08 ENCOUNTER — Other Ambulatory Visit: Payer: Self-pay | Admitting: Family Medicine

## 2020-12-08 DIAGNOSIS — E785 Hyperlipidemia, unspecified: Secondary | ICD-10-CM

## 2021-01-28 DIAGNOSIS — H2513 Age-related nuclear cataract, bilateral: Secondary | ICD-10-CM | POA: Diagnosis not present

## 2021-01-31 ENCOUNTER — Other Ambulatory Visit: Payer: Self-pay

## 2021-01-31 ENCOUNTER — Other Ambulatory Visit: Payer: Medicare Other

## 2021-01-31 DIAGNOSIS — N138 Other obstructive and reflux uropathy: Secondary | ICD-10-CM

## 2021-01-31 DIAGNOSIS — N401 Enlarged prostate with lower urinary tract symptoms: Secondary | ICD-10-CM | POA: Diagnosis not present

## 2021-01-31 DIAGNOSIS — R350 Frequency of micturition: Secondary | ICD-10-CM

## 2021-02-01 LAB — PSA: Prostate Specific Ag, Serum: 2.8 ng/mL (ref 0.0–4.0)

## 2021-02-02 IMAGING — CT CT CHEST LUNG CANCER SCREENING LOW DOSE W/O CM
3 of 5 series · 14 of 40 positions shown, 15 images · non-contrast
Comparison: Low-dose lung cancer screening chest CT 08/19/2018.

CLINICAL DATA: 73-year-old male current smoker with 36 pack-year
history of smoking. Lung cancer screening examination.

EXAM:
CT CHEST WITHOUT CONTRAST LOW-DOSE FOR LUNG CANCER SCREENING
TECHNIQUE: Multidetector CT imaging of the chest was performed following the
standard protocol without IV contrast.

[Series 2: lung 5.00 · axial · 0.57mm/px · z∈[-1216,-871]mm · 3 of 70 slices shown, 4 images]
[im 1/70  mediastinal]
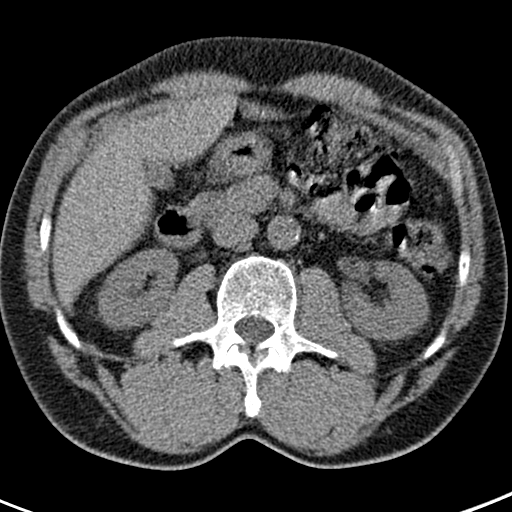
[im 1/70  lung]
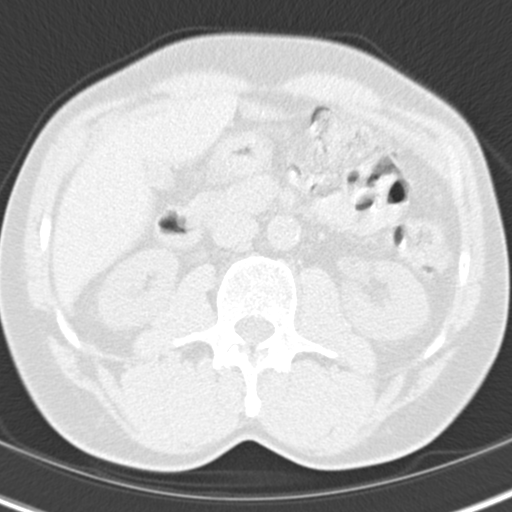
[im 35/70  lung]
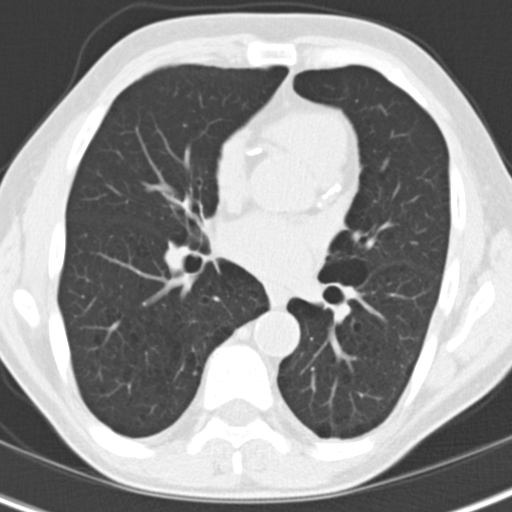
[im 70/70  lung]
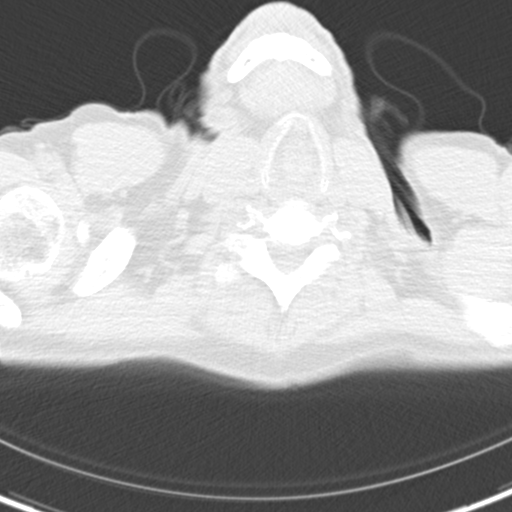

[Series 3: lung 1.00 · axial · 0.58mm/px · z∈[-1157,-901]mm · 8 of 320 slices shown]
[im 32/320  lung]
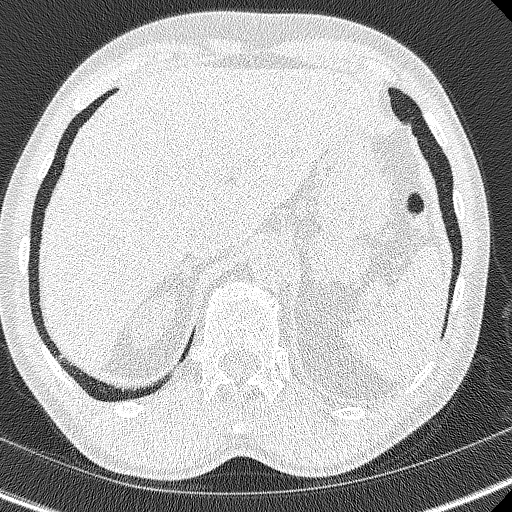
[im 64/320  lung]
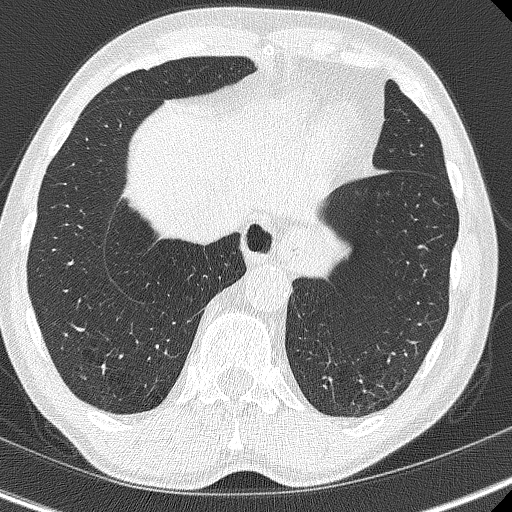
[im 96/320  lung]
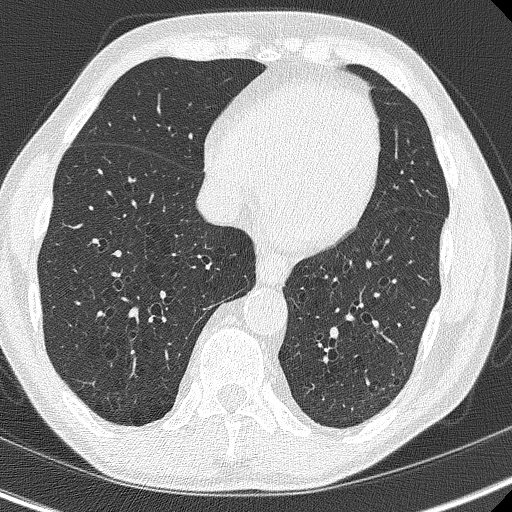
[im 144/320  lung]
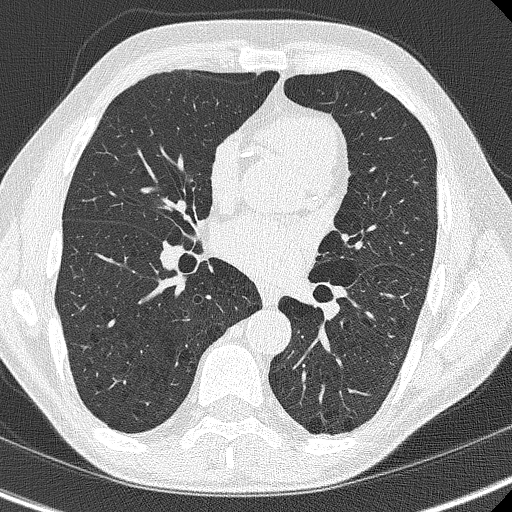
[im 176/320  lung]
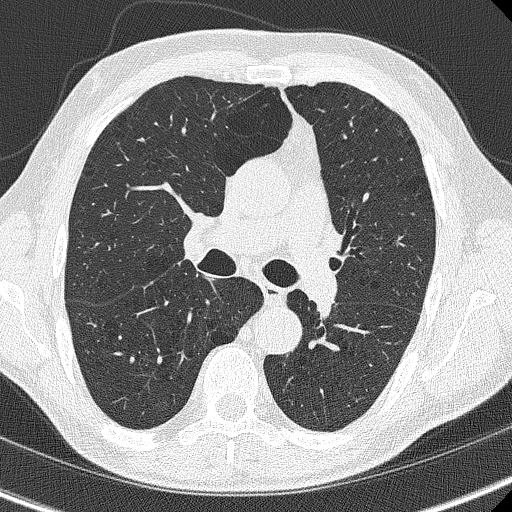
[im 224/320  lung]
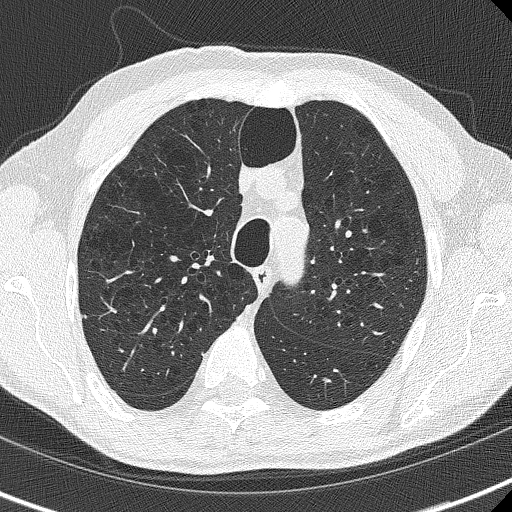
[im 256/320  lung]
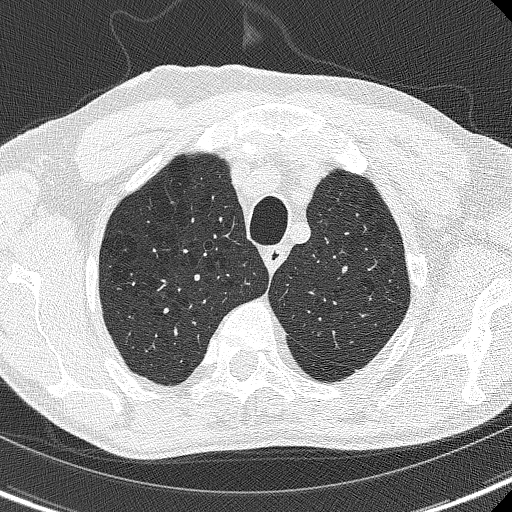
[im 288/320  lung]
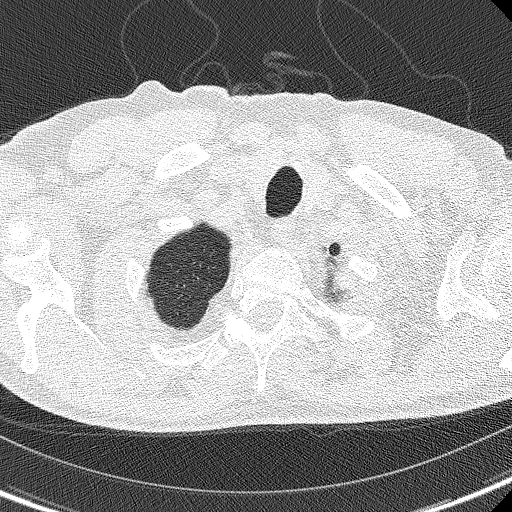

[Series 4: coronals lung 1.00 cor · coronal · 0.57mm/px · 3 of 289 slices shown]
[im 58/289  lung]
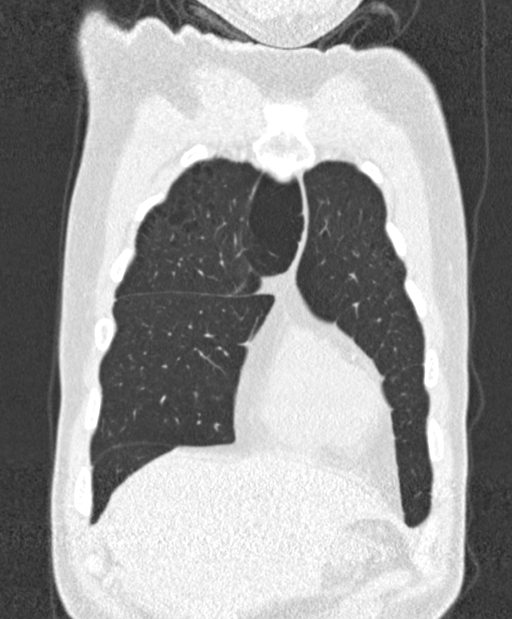
[im 116/289  lung]
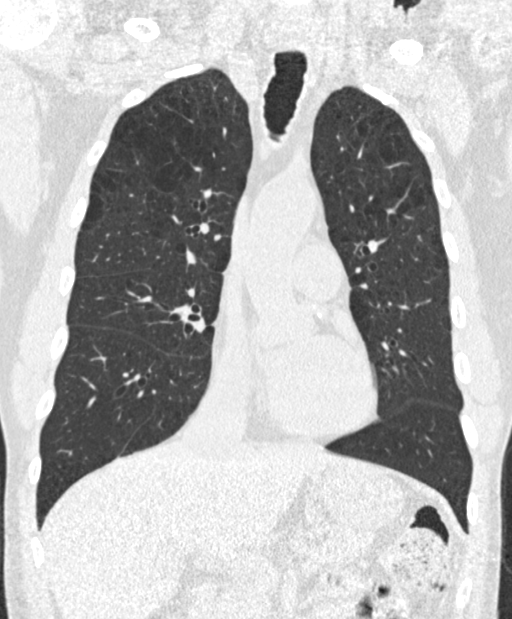
[im 173/289  lung]
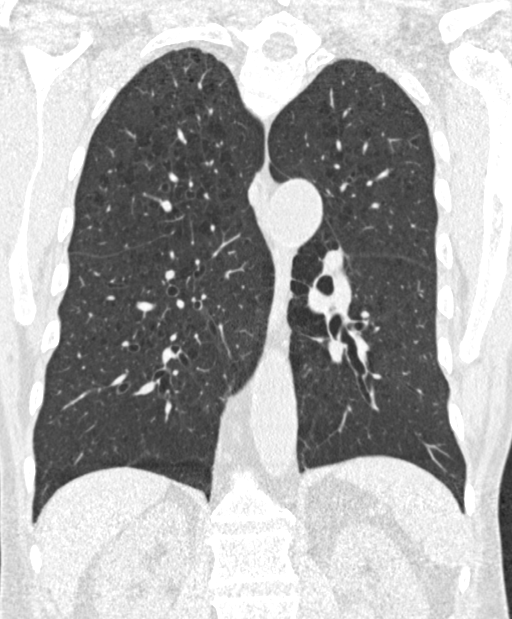

[14 of 40 positions shown; findings below may reference images not displayed]

FINDINGS: Cardiovascular: Heart size is normal. There is no significant
pericardial fluid, thickening or pericardial calcification. There is
aortic atherosclerosis, as well as atherosclerosis of the great
vessels of the mediastinum and the coronary arteries, including
calcified atherosclerotic plaque in the left main, left anterior
descending, left circumflex and right coronary arteries.

Mediastinum/Nodes: No pathologically enlarged mediastinal or hilar
lymph nodes. Please note that accurate exclusion of hilar adenopathy
is limited on noncontrast CT scans. Small to moderate hiatal hernia.
No axillary lymphadenopathy.

Lungs/Pleura: Multiple small pulmonary nodules are noted throughout
the lungs bilaterally, largest of which is in the right upper lobe
(axial image 81 of series 3), with a volume derived mean diameter of
3.4 mm. No larger more suspicious appearing pulmonary nodules or
masses are noted. No acute consolidative airspace disease. No
pleural effusions. Mild diffuse bronchial wall thickening with
moderate centrilobular and paraseptal emphysema. Scattered areas of
cylindrical and mild varicose bronchiectasis.

Upper Abdomen: Aortic atherosclerosis. 1.9 x 1.3 cm low-attenuation
lesion in segment 3, incompletely characterized on today's
non-contrast CT examination, but similar to the prior study and
statistically likely to represent a cyst.

Musculoskeletal: There are no aggressive appearing lytic or blastic
lesions noted in the visualized portions of the skeleton.
IMPRESSION: 1. Lung-RADS 2S, benign appearance or behavior. Continue annual
screening with low-dose chest CT without contrast in 12 months.
2. The "S" modifier above refers to potentially clinically
significant non lung cancer related findings. Specifically, there is
aortic atherosclerosis, in addition to left main and 3 vessel
coronary artery disease. Assessment for potential risk factor
modification, dietary therapy or pharmacologic therapy may be
warranted, if clinically indicated.
3. Mild diffuse bronchial wall thickening with moderate
centrilobular and paraseptal emphysema; imaging findings suggestive
of underlying COPD.
4. Scattered areas of bronchiectasis in the lungs bilaterally, as
above.
5. Small to moderate hiatal hernia.

Aortic Atherosclerosis (BUA1M-ICF.F) and Emphysema (BUA1M-PT3.7).

## 2021-02-03 NOTE — Progress Notes (Signed)
02/04/2021 8:48 AM   Kevin Roberts [birth year] [id number]  Referring provider: Steele Sizer, MD [address] [place],  [place] [address]  Chief Complaint  Patient presents with   Benign Prostatic Hypertrophy    Urological history: 1. BPH with LU TS -PSA 2.8 in 01/2021 -I PSS 7/1 -managed with tamsulosin 0.4 mg daily  2. Family history of prostate cancer -baby brother has been diagnosed with prostate cancer, he is in his late 1's  HPI: Kevin Roberts is a 75 y.o. male who presents today for yearly follow up.  He has nocturia x 1.  Patient denies any modifying or aggravating factors.  Patient denies any gross hematuria, dysuria or suprapubic/flank pain.  Patient denies any fevers, chills, nausea or vomiting.     IPSS     Row Name 02/04/21 0800         International Prostate Symptom Score   How often have you had the sensation of not emptying your bladder? Less than 1 in 5     How often have you had to urinate less than every two hours? Less than 1 in 5 times     How often have you found you stopped and started again several times when you urinated? Less than 1 in 5 times     How often have you found it difficult to postpone urination? Less than 1 in 5 times     How often have you had a weak urinary stream? Less than 1 in 5 times     How often have you had to strain to start urination? Less than 1 in 5 times     How many times did you typically get up at night to urinate? 1 Time     Total IPSS Score 7       Quality of Life due to urinary symptoms   If you were to spend the rest of your life with your urinary condition just the way it is now how would you feel about that? Pleased               Score:  1-7 Mild 8-19 Moderate 20-35 Severe   PMH: Past Medical History:  Diagnosis Date   Benign prostatic hypertrophy    Chronic kidney disease, stage III (moderate) (HCC)    COPD (chronic obstructive pulmonary disease) (HCC)    Cramps, muscle,  general    Elevated ferritin    Emphysema of lung (HCC)    GERD (gastroesophageal reflux disease)    History of hypercalcemia    Hyperlipidemia    Hypertension    Incomplete bladder emptying    Nocturia    Nonobstructive CAD    a. 06/2015 MV: mid ant/antseptal/apical anterior ischemia; b. 06/2015 Cath: LM nl, LAD Ca2+, LCX min irregs, OM1/2/3 nl, RCA mod Ca2+, RPDA/RPAV/RPL1,2,3 nl. EF 55-65%.   Peripheral polyneuropathy    Rising PSA level    Tobacco use    Urgency of micturation    Vitamin B 12 deficiency     Surgical History: Past Surgical History:  Procedure Laterality Date   CARDIAC CATHETERIZATION N/A 07/02/2015   Procedure: Left Heart Cath and Coronary Angiography;  Surgeon: Wellington Hampshire, MD;  Location: [facility];  Service: Cardiovascular;  Laterality: N/A;   COLONOSCOPY  2011   JWB   COLONOSCOPY WITH PROPOFOL N/A 11/26/2019   Procedure: COLONOSCOPY WITH PROPOFOL;  Surgeon: Robert Bellow, MD;  Location: [facility];  Service: Endoscopy;  Laterality: N/A;  ESOPHAGOGASTRODUODENOSCOPY (EGD) WITH PROPOFOL N/A 11/14/2017   Procedure: ESOPHAGOGASTRODUODENOSCOPY (EGD) WITH PROPOFOL;  Surgeon: Robert Bellow, MD;  Location: [facility];  Service: Endoscopy;  Laterality: N/A;   throat biopsy     lump removed    Home Medications:  Allergies as of 02/04/2021   No Known Allergies      Medication List        Accurate as of February 04, 2021  8:48 AM. If you have any questions, ask your nurse or doctor.          Anoro Ellipta 62.5-25 MCG/ACT Aepb Generic drug: umeclidinium-vilanterol INHALE 1 PUFF BY MOUTH INTO LUNGS DAILY   aspirin 81 MG tablet Take 1 tablet by mouth daily.   atorvastatin 40 MG tablet Commonly known as: LIPITOR TAKE 1 TABLET(40 MG) BY MOUTH DAILY   B-12 1000 MCG Tbcr Take 1 tablet by mouth daily.   gabapentin 300 MG capsule Commonly known as: NEURONTIN Take 1 capsule (300 mg total) by mouth 2 (two) times daily.    MULTIPLE VITAMIN PO Take 1 tablet by mouth daily.   nitroGLYCERIN 0.4 MG SL tablet Commonly known as: NITROSTAT PLACE ONE TABLET UNDER TONGUE AS NEEDED FOR CHEST PAIN EVERY 5 MINUTES   pantoprazole 40 MG tablet Commonly known as: PROTONIX TAKE 1 TABLET(40 MG) BY MOUTH DAILY   tamsulosin 0.4 MG Caps capsule Commonly known as: FLOMAX Take 1 capsule (0.4 mg total) by mouth daily.   Vitamin D (Cholecalciferol) 25 MCG (1000 UT) Caps Take 1 tablet by mouth daily.        Allergies: No Known Allergies  Family History: Family History  Problem Relation Age of Onset   Hypertension Mother    CVA Mother    Cancer Father        Lung   Mental illness Brother        1-Schizophrenia   GER disease Son    Healthy Sister    Heart Problems Brother    Gallbladder disease Brother    Healthy Sister    Gallbladder disease Brother    Hypertension Brother     Social History:  reports that he has been smoking cigarettes. He started smoking about 47 years ago. He has a 66.00 pack-year smoking history. He has never used smokeless tobacco. He reports that he does not drink alcohol and does not use drugs.  ROS: For pertinent review of systems please refer to history of present illness  Physical Exam: BP 134/70   Pulse 74   Ht 5\' 11"  (1.803 m)   Wt 160 lb (72.6 kg)   BMI 22.32 kg/m   Constitutional:  Well nourished. Alert and oriented, No acute distress. HEENT: [place] AT, mask in place.  Trachea midline Cardiovascular: No clubbing, cyanosis, or edema. Respiratory: Normal respiratory effort, no increased work of breathing. GU: No CVA tenderness.  No bladder fullness or masses.  Patient with circumcised phallus. Urethral meatus is patent.  No penile discharge. No penile lesions or rashes. Scrotum without lesions, cysts, rashes and/or edema.  Testicles are located scrotally bilaterally. No masses are appreciated in the testicles. Left and right epididymis are normal. Rectal: Patient with  normal  sphincter tone. Anus and perineum without scarring or rashes. No rectal masses are appreciated. Prostate is approximately 50 grams, no nodules are appreciated. Seminal vesicles could not be palpated Neurologic: Grossly intact, no focal deficits, moving all 4 extremities. Psychiatric: Normal mood and affect.   Laboratory Data: Lab Results  Component Value Date  CREATININE 1.52 (H) 08/17/2020   Component     Latest Ref Rng & Units 08/17/2020  WBC     3.8 - 10.8 Thousand/uL 7.9  RBC     4.20 - 5.80 Million/uL 4.90  Hemoglobin     13.2 - 17.1 g/dL 15.1  HCT     38.5 - 50.0 % 45.5  MCV     80.0 - 100.0 fL 92.9  MCH     27.0 - 33.0 pg 30.8  MCHC     32.0 - 36.0 g/dL 33.2  RDW     11.0 - 15.0 % 12.1  Platelets     140 - 400 Thousand/uL 152  MPV     7.5 - 12.5 fL 12.6 (H)  NEUT#     1,500 - 7,800 cells/uL 5,585  Lymphocyte #     850 - 3,900 cells/uL 1,620  Monocyte #     200 - 950 cells/uL   Eosinophils Absolute     15 - 500 cells/uL 40  Basophils Absolute     0 - 200 cells/uL 32  Neutrophils     % 70.7  Lymphocytes     %   Monocytes Relative     % 7.9  Eosinophil     % 0.5  Basophil     % 0.4  Smear Review        WBC mixed population     200 - 950 cells/uL   Total Lymphocyte     % 20.5  Absolute Monocytes     200 - 950 cells/uL 624  WBC, UA     0 - 5 /hpf   Epithelial Cells (non renal)     0 - 10 /hpf   Bacteria, UA     None seen/Few    Component     Latest Ref Rng & Units 08/17/2020  Glucose     65 - 99 mg/dL 91  BUN     7 - 25 mg/dL 10  Creatinine     0.70 - 1.18 mg/dL 1.52 (H)  GFR, Est Non African American     > OR = 60 mL/min/1.8m2 45 (L)  GFR, Est African American     > OR = 60 mL/min/1.17m2 52 (L)  BUN/Creatinine Ratio     6 - 22 (calc) 7  Sodium     135 - 146 mmol/L 140  Potassium     3.5 - 5.3 mmol/L 4.2  Chloride     98 - 110 mmol/L 106  CO2     20 - 32 mmol/L 30  Calcium     8.6 - 10.3 mg/dL 9.9  Total Protein     6.1 -  8.1 g/dL 7.1  Albumin MSPROF     3.6 - 5.1 g/dL 4.5  Globulin     1.9 - 3.7 g/dL (calc) 2.6  AG Ratio     1.0 - 2.5 (calc) 1.7  Total Bilirubin     0.2 - 1.2 mg/dL 1.1  Alkaline phosphatase (APISO)     35 - 144 U/L 53  AST     10 - 35 U/L 21  ALT     9 - 46 U/L 14   Component     Latest Ref Rng & Units 08/17/2020  Cholesterol     <200 mg/dL 103  HDL Cholesterol     > OR = 40 mg/dL 57  Triglycerides     <150 mg/dL 81  LDL Cholesterol (Calc)  mg/dL (calc) 30  Total CHOL/HDL Ratio     <5.0 (calc) 1.8  Non-HDL Cholesterol (Calc)     <130 mg/dL (calc) 46   Lab Results  Component Value Date   PSA 2.0 09/23/2013   PSA history:  1.8 ng/mL on 02/02/2014  Component     Latest Ref Rng & Units 02/03/2015 01/27/2016  Prostate Specific Ag, Serum     0.0 - 4.0 ng/mL 1.9 2.1    Component     Latest Ref Rng & Units 01/25/2017 01/30/2018 01/23/2019 01/23/2020  Prostate Specific Ag, Serum     0.0 - 4.0 ng/mL 1.6 1.9 2.0 2.4   Component     Latest Ref Rng & Units 01/31/2021  Prostate Specific Ag, Serum     0.0 - 4.0 ng/mL 2.8  I have reviewed the labs.   Pertinent Imaging: N/A   Assessment and Plan  1. BPH with LUTS -PSA stable -DRE benign -continue conservative management, avoiding bladder irritants and timed voiding's -Continue tamsulosin 0.4 mg daily-refills given    Return in about 1 year (around 02/04/2022) for IPSS, PSA and exam.  Zara Council, [facility]  [address] [place], [place] [address] [phone]

## 2021-02-04 ENCOUNTER — Other Ambulatory Visit: Payer: Self-pay

## 2021-02-04 ENCOUNTER — Encounter: Payer: Self-pay | Admitting: Urology

## 2021-02-04 ENCOUNTER — Ambulatory Visit (INDEPENDENT_AMBULATORY_CARE_PROVIDER_SITE_OTHER): Payer: Medicare Other | Admitting: Urology

## 2021-02-04 VITALS — BP 134/70 | HR 74 | Ht 71.0 in | Wt 160.0 lb

## 2021-02-04 DIAGNOSIS — N401 Enlarged prostate with lower urinary tract symptoms: Secondary | ICD-10-CM

## 2021-02-04 DIAGNOSIS — N138 Other obstructive and reflux uropathy: Secondary | ICD-10-CM

## 2021-02-04 DIAGNOSIS — R350 Frequency of micturition: Secondary | ICD-10-CM | POA: Diagnosis not present

## 2021-02-04 DIAGNOSIS — I2510 Atherosclerotic heart disease of native coronary artery without angina pectoris: Secondary | ICD-10-CM | POA: Diagnosis not present

## 2021-02-04 MED ORDER — TAMSULOSIN HCL 0.4 MG PO CAPS: 0.4000 mg | ORAL_CAPSULE | Freq: Every day | ORAL | 3 refills | Status: DC

## 2021-02-16 NOTE — Progress Notes (Signed)
Name: Kevin Roberts   MRN: [id number]    DOB: 1946/03/18   Date:02/17/2021       Progress Note  Subjective  Chief Complaint  Follow Up  HPI  COPD Moderate  : he is taking Anoro, he denies any cough, wheezing , he is not very active, states SOB only with moderate activity.  He still usually smoking 1.5 pack daily Last CT lung was done 08/2019, no nodules, centrilobular and paraseptal emphysema, scattered areas of bronchiectasis. He is not ready to quit smoking yet, advised to at least keep cigarettes at a different location of the house , not near him    History of Alcoholism: he used to drink before retirement - about 2 shots at night, however when he retired at age 22 he started to drink about 1/5 of liquor every two days. He states he stopped drinking because he fell on his face, developed a gastric ulcer and B12 deficiency. He is doing well since he stopped drinking around 2010. He still has tingling on his legs He takes gabapentin and symptoms are controlled    GERD and history of gastric ulcer:/hiatal hernia   He states symptoms controlled as long as he takes a PPI, no heartburn or regurgitation. He was seen by Dr. Fleet Contras and had EGD in August 2019 , large diverticulum but no signs of bleeding and has hiatal hernia small to moderate in size. Symptoms have not been present lately , now only triggered when he eats spicy food and goes to bed   Hyperlipidemia: taking Atorvastatin and denies side effects. Taking aspirin daily . Denies myalgia.Last LDl was 30, HDL 57   CAD/Angina : found on CT chest 3 vessel disease he has intermittent chest pain and also has SOB that may may be multifactorial from COPD also. He was seen by Dr. Fletcher Anon and advised to quit smoking. On aspirin, statins and beta-blocker. Cardiac cath in 2017 showed mild to moderate non-obstructive disease and is only on medical management. Recently seen at cardiologist office, he was placed on Imdur in May but stopped in August due to  increase in daytime fatigue. Chest pain still present a few times a month it can last up to 5-10 minutes and resolves with NTG SL   B12 neuropathy: he is taking B12 supplementation and also takes Neurontin , he states tingling has improved but still present. Tips of fingers and also both legs and feet. Unchanged    Emphysema : he has been a smoker for 44 years, he is up to date with triple A screening, negative in 2016, chest CT done 005/18/2022 that showed emphysema again  .  Explained importance of quitting smoking again, he states he used nicotine patches in the past, he states it helped temporarily, but when he skipped doses he smoked more  He is on statin therapy and baby aspirin. He is still smoking 1.5 pack cigarettes per day    Atherosclerosis abdominal aorta: on medical management, aspirin,  and statin therapy. He is still smoking , unable to quit   CKI: stage III, he avoiding NSAID's, no pruritis, good urine output , GFR has been stable.   Renal Cyst: picked up on CT lung and had follow up renal US, benign cyst . Monitored by Urologist   Left gynecomastia: we did blood work in the fall that was normal.  No pain or discomfort , CT did not show anything , size is unchnaged   BPH: under the care of Urologist , IPSS  score was 7 recently, PSA was 2.8 on 01/2021 and he will go back for yearly follow ups, brother had prostate cancer in his 57's   Leg cramps: it only happens at night, sporadic, at few times a month. Discussed stretching , drinking more fluids .   Patient Active Problem List   Diagnosis Date Noted   Bronchiectasis without complication ([place]) 12/75/1700   History of gastric ulcer 02/17/2020   Need for immunization against influenza 02/17/2020   Hiatal hernia 01/22/2018   Abdominal pain, chronic, right lower quadrant 10/26/2017   Esophageal diverticulum 10/26/2017   History of alcoholism ([place]) 01/18/2016   Gynecomastia 10/14/2015   Abnormal nuclear stress test    Anginal  equivalent ([place])    Atherosclerosis of aorta ([place]) 04/15/2015   BPH with obstruction/lower urinary tract symptoms 02/03/2015   Renal cyst, left 10/13/2014   Tobacco use    B12 neuropathy ([place]) 10/04/2014   CAD, multiple vessel 10/04/2014   Benign essential HTN 10/04/2014   Chronic kidney disease (CKD), stage III (moderate) ([place]) 10/04/2014   Dyslipidemia 10/04/2014   COPD, moderate ([place]) 10/04/2014   Gastro-esophageal reflux disease without esophagitis 10/04/2014   Enlarged prostate 10/04/2014   Vitamin D deficiency 10/04/2014    Past Surgical History:  Procedure Laterality Date   CARDIAC CATHETERIZATION N/A 07/02/2015   Procedure: Left Heart Cath and Coronary Angiography;  Surgeon: Wellington Hampshire, MD;  Location: [facility];  Service: Cardiovascular;  Laterality: N/A;   COLONOSCOPY  2011   JWB   COLONOSCOPY WITH PROPOFOL N/A 11/26/2019   Procedure: COLONOSCOPY WITH PROPOFOL;  Surgeon: Robert Bellow, MD;  Location: [facility];  Service: Endoscopy;  Laterality: N/A;   ESOPHAGOGASTRODUODENOSCOPY (EGD) WITH PROPOFOL N/A 11/14/2017   Procedure: ESOPHAGOGASTRODUODENOSCOPY (EGD) WITH PROPOFOL;  Surgeon: Robert Bellow, MD;  Location: [facility];  Service: Endoscopy;  Laterality: N/A;   throat biopsy     lump removed    Family History  Problem Relation Age of Onset   Hypertension Mother    CVA Mother    Cancer Father        Lung   Mental illness Brother        1-Schizophrenia   GER disease Son    Healthy Sister    Heart Problems Brother    Gallbladder disease Brother    Healthy Sister    Gallbladder disease Brother    Hypertension Brother     Social History   Tobacco Use   Smoking status: Every Day    Packs/day: 1.50    Years: 44.00    Pack years: 66.00    Types: Cigarettes    Start date: 10/04/1973   Smokeless tobacco: Never   Tobacco comments:    [place] Smoking Cessation Information given   Substance Use Topics   Alcohol use: No     Alcohol/week: 0.0 standard drinks     Current Outpatient Medications:    aspirin 81 MG tablet, Take 1 tablet by mouth daily., Disp: , Rfl:    atorvastatin (LIPITOR) 40 MG tablet, TAKE 1 TABLET(40 MG) BY MOUTH DAILY, Disp: 90 tablet, Rfl: 3   Cyanocobalamin (B-12) 1000 MCG TBCR, Take 1 tablet by mouth daily., Disp: , Rfl:    gabapentin (NEURONTIN) 300 MG capsule, Take 1 capsule (300 mg total) by mouth 2 (two) times daily., Disp: 180 capsule, Rfl: 3   MULTIPLE VITAMIN PO, Take 1 tablet by mouth daily., Disp: , Rfl:    nitroGLYCERIN (NITROSTAT) 0.4 MG SL tablet,  PLACE ONE TABLET UNDER TONGUE AS NEEDED FOR CHEST PAIN EVERY 5 MINUTES, Disp: 30 tablet, Rfl: 2   pantoprazole (PROTONIX) 40 MG tablet, TAKE 1 TABLET(40 MG) BY MOUTH DAILY, Disp: 90 tablet, Rfl: 1   tamsulosin (FLOMAX) 0.4 MG CAPS capsule, Take 1 capsule (0.4 mg total) by mouth daily., Disp: 90 capsule, Rfl: 3   umeclidinium-vilanterol (ANORO ELLIPTA) 62.5-25 MCG/INH AEPB, INHALE 1 PUFF BY MOUTH INTO LUNGS DAILY, Disp: 60 each, Rfl: 5   Vitamin D, Cholecalciferol, 1000 UNITS CAPS, Take 1 tablet by mouth daily., Disp: , Rfl:   No Known Allergies  I personally reviewed active problem list, medication list, allergies, family history, social history, health maintenance with the patient/caregiver today.   ROS  Constitutional: Negative for fever or weight change.  Respiratory: Negative for cough and shortness of breath.   Cardiovascular: Negative for chest pain or palpitations.  Gastrointestinal: Negative for abdominal pain, no bowel changes.  Musculoskeletal: Negative for gait problem or joint swelling.  Skin: Negative for rash.  Neurological: Negative for dizziness or headache.  No other specific complaints in a complete review of systems (except as listed in HPI above).   Objective  Vitals:   02/17/21 0754  BP: 120/68  Pulse: 73  Resp: 16  Temp: 98.3 F (36.8 C)  SpO2: 98%  Weight: 156 lb (70.8 kg)  Height: 5\' 11"  (1.803  m)    Body mass index is 21.76 kg/m.  Physical Exam  Constitutional: Patient appears well-developed and well-nourished.  No distress.  HEENT: head atraumatic, normocephalic, pupils equal and reactive to light, neck supple Cardiovascular: Normal rate, regular rhythm and normal heart sounds.  No murmur heard. No BLE edema. Pulmonary/Chest: Effort normal and breath sounds normal. No respiratory distress. Abdominal: Soft.  There is no tenderness. Psychiatric: Patient has a normal mood and affect. behavior is normal. Judgment and thought content normal.   Recent Results (from the past 2160 hour(s))  PSA     Status: None   Collection Time: 01/31/21  8:23 AM  Result Value Ref Range   Prostate Specific Ag, Serum 2.8 0.0 - 4.0 ng/mL    Comment: Roche ECLIA methodology. According to the American Urological Association, Serum PSA should decrease and remain at undetectable levels after radical prostatectomy. The AUA defines biochemical recurrence as an initial PSA value 0.2 ng/mL or greater followed by a subsequent confirmatory PSA value 0.2 ng/mL or greater. Values obtained with different assay methods or kits cannot be used interchangeably. Results cannot be interpreted as absolute evidence of the presence or absence of malignant disease.      PHQ2/9: Depression screen [facility] 2/9 02/17/2021 10/14/2020 08/17/2020 02/17/2020 10/14/2019  Decreased Interest 0 0 0 0 0  Down, Depressed, Hopeless 0 0 0 0 0  PHQ - 2 Score 0 0 0 0 0  Altered sleeping 0 - - - -  Tired, decreased energy 0 - - - -  Change in appetite 0 - - - -  Feeling bad or failure about yourself  0 - - - -  Trouble concentrating 0 - - - -  Moving slowly or fidgety/restless 0 - - - -  Suicidal thoughts 0 - - - -  PHQ-9 Score 0 - - - -  Difficult doing work/chores - - - - -  Some recent data might be hidden    phq 9 is negative   Fall Risk: Fall Risk  02/17/2021 10/14/2020 08/17/2020 02/17/2020 10/14/2019  Falls in the past year? 0  0 0  0 0  Number falls in past yr: 0 0 0 0 0  Injury with Fall? 0 0 0 0 0  Risk for fall due to : No Fall Risks No Fall Risks - - No Fall Risks  Risk for fall due to: Comment - - - - -  Follow up Falls prevention discussed Falls prevention discussed Falls evaluation completed - Falls prevention discussed      Functional Status Survey: Is the patient deaf or have difficulty hearing?: No Does the patient have difficulty seeing, even when wearing glasses/contacts?: No Does the patient have difficulty concentrating, remembering, or making decisions?: No Does the patient have difficulty walking or climbing stairs?: No Does the patient have difficulty dressing or bathing?: No Does the patient have difficulty doing errands alone such as visiting a doctor's office or shopping?: No    Assessment & Plan  1. COPD, moderate ([place])  Needs to resume Anoro daily and quit smoking   2. Atherosclerosis of aorta (HCC)  Continue statin therapy   3. Stage 3a chronic kidney disease ([place])  Recheck labs next visit and avoid NSAID's  4. Anginal equivalent [facility])  Seeing cardiologist , using NTG prn  5. Centrilobular emphysema ([place])   6. B12 neuropathy ([place])   7. History of alcoholism ([place])  In remission   8. Need for immunization against influenza  - Flu Vaccine QUAD High Dose([place])  9. CAD, multiple vessel   10. GERD without esophagitis  - pantoprazole (PROTONIX) 40 MG tablet; TAKE 1 TABLET(40 MG) BY MOUTH DAILY  Dispense: 90 tablet; Refill: 1  11. Dyslipidemia   12. History of gastric ulcer  - pantoprazole (PROTONIX) 40 MG tablet; TAKE 1 TABLET(40 MG) BY MOUTH DAILY  Dispense: 90 tablet; Refill: 1

## 2021-02-17 ENCOUNTER — Encounter: Payer: Self-pay | Admitting: Family Medicine

## 2021-02-17 ENCOUNTER — Ambulatory Visit (INDEPENDENT_AMBULATORY_CARE_PROVIDER_SITE_OTHER): Payer: Medicare Other | Admitting: Family Medicine

## 2021-02-17 ENCOUNTER — Other Ambulatory Visit: Payer: Self-pay

## 2021-02-17 VITALS — BP 120/68 | HR 73 | Temp 98.3°F | Resp 16 | Ht 71.0 in | Wt 156.0 lb

## 2021-02-17 DIAGNOSIS — Z23 Encounter for immunization: Secondary | ICD-10-CM

## 2021-02-17 DIAGNOSIS — N1831 Chronic kidney disease, stage 3a: Secondary | ICD-10-CM | POA: Diagnosis not present

## 2021-02-17 DIAGNOSIS — I700 Atherosclerosis of aorta: Secondary | ICD-10-CM | POA: Diagnosis not present

## 2021-02-17 DIAGNOSIS — K219 Gastro-esophageal reflux disease without esophagitis: Secondary | ICD-10-CM

## 2021-02-17 DIAGNOSIS — J449 Chronic obstructive pulmonary disease, unspecified: Secondary | ICD-10-CM | POA: Diagnosis not present

## 2021-02-17 DIAGNOSIS — I208 Other forms of angina pectoris: Secondary | ICD-10-CM | POA: Diagnosis not present

## 2021-02-17 DIAGNOSIS — I2510 Atherosclerotic heart disease of native coronary artery without angina pectoris: Secondary | ICD-10-CM | POA: Diagnosis not present

## 2021-02-17 DIAGNOSIS — Z8711 Personal history of peptic ulcer disease: Secondary | ICD-10-CM

## 2021-02-17 DIAGNOSIS — E538 Deficiency of other specified B group vitamins: Secondary | ICD-10-CM

## 2021-02-17 DIAGNOSIS — G63 Polyneuropathy in diseases classified elsewhere: Secondary | ICD-10-CM

## 2021-02-17 DIAGNOSIS — F1021 Alcohol dependence, in remission: Secondary | ICD-10-CM

## 2021-02-17 DIAGNOSIS — E785 Hyperlipidemia, unspecified: Secondary | ICD-10-CM

## 2021-02-17 DIAGNOSIS — J432 Centrilobular emphysema: Secondary | ICD-10-CM

## 2021-02-17 MED ORDER — PANTOPRAZOLE SODIUM 40 MG PO TBEC: DELAYED_RELEASE_TABLET | ORAL | 1 refills | Status: DC

## 2021-04-07 ENCOUNTER — Other Ambulatory Visit: Payer: Self-pay | Admitting: Family Medicine

## 2021-04-07 DIAGNOSIS — J449 Chronic obstructive pulmonary disease, unspecified: Secondary | ICD-10-CM

## 2021-04-11 ENCOUNTER — Other Ambulatory Visit: Payer: Self-pay | Admitting: Urology

## 2021-04-11 ENCOUNTER — Other Ambulatory Visit: Payer: Self-pay | Admitting: Family Medicine

## 2021-04-11 DIAGNOSIS — K219 Gastro-esophageal reflux disease without esophagitis: Secondary | ICD-10-CM

## 2021-04-11 DIAGNOSIS — N401 Enlarged prostate with lower urinary tract symptoms: Secondary | ICD-10-CM

## 2021-04-11 DIAGNOSIS — Z8711 Personal history of peptic ulcer disease: Secondary | ICD-10-CM

## 2021-04-11 DIAGNOSIS — R350 Frequency of micturition: Secondary | ICD-10-CM

## 2021-04-22 ENCOUNTER — Other Ambulatory Visit: Payer: Self-pay | Admitting: Family Medicine

## 2021-04-22 DIAGNOSIS — I208 Other forms of angina pectoris: Secondary | ICD-10-CM

## 2021-04-22 DIAGNOSIS — I700 Atherosclerosis of aorta: Secondary | ICD-10-CM

## 2021-06-08 ENCOUNTER — Encounter: Payer: Self-pay | Admitting: Nurse Practitioner

## 2021-06-08 ENCOUNTER — Ambulatory Visit (INDEPENDENT_AMBULATORY_CARE_PROVIDER_SITE_OTHER): Payer: Medicare Other | Admitting: Nurse Practitioner

## 2021-06-08 ENCOUNTER — Other Ambulatory Visit: Payer: Self-pay

## 2021-06-08 VITALS — BP 120/62 | HR 71 | Ht 71.0 in | Wt 159.0 lb

## 2021-06-08 DIAGNOSIS — R072 Precordial pain: Secondary | ICD-10-CM

## 2021-06-08 DIAGNOSIS — E782 Mixed hyperlipidemia: Secondary | ICD-10-CM

## 2021-06-08 DIAGNOSIS — K219 Gastro-esophageal reflux disease without esophagitis: Secondary | ICD-10-CM

## 2021-06-08 DIAGNOSIS — I10 Essential (primary) hypertension: Secondary | ICD-10-CM

## 2021-06-08 DIAGNOSIS — I25119 Atherosclerotic heart disease of native coronary artery with unspecified angina pectoris: Secondary | ICD-10-CM

## 2021-06-08 DIAGNOSIS — Z720 Tobacco use: Secondary | ICD-10-CM

## 2021-06-08 NOTE — Patient Instructions (Signed)

## 2021-06-08 NOTE — Progress Notes (Signed)
Office Visit    Patient Name: Kevin Roberts Date of Encounter: 06/08/2021  Primary Care Provider:  Steele Sizer, MD Primary Cardiologist:  [name], MD  Chief Complaint    76 y/o ? w/ a h/o nonobs CAD, HTN, HL, tob abuse, COPD, BPH, and GERD, who presents for f/u related to chest pain.  Past Medical History    Past Medical History:  Diagnosis Date   Benign prostatic hypertrophy    Chronic kidney disease, stage III (moderate) (HCC)    COPD (chronic obstructive pulmonary disease) (HCC)    Cramps, muscle, general    Elevated ferritin    Emphysema of lung (HCC)    GERD (gastroesophageal reflux disease)    History of hypercalcemia    Hyperlipidemia    Hypertension    Incomplete bladder emptying    Nocturia    Nonobstructive CAD    a. 06/2015 MV: mid ant/antseptal/apical anterior ischemia; b. 06/2015 Cath: LM nl, LAD Ca2+, LCX min irregs, OM1/2/3 nl, RCA mod Ca2+, RPDA/RPAV/RPL1,2,3 nl. EF 55-65%.   Peripheral polyneuropathy    Rising PSA level    Tobacco use    Urgency of micturation    Vitamin B 12 deficiency    Past Surgical History:  Procedure Laterality Date   CARDIAC CATHETERIZATION N/A 07/02/2015   Procedure: Left Heart Cath and Coronary Angiography;  Surgeon: Wellington Hampshire, MD;  Location: [facility];  Service: Cardiovascular;  Laterality: N/A;   COLONOSCOPY  2011   JWB   COLONOSCOPY WITH PROPOFOL N/A 11/26/2019   Procedure: COLONOSCOPY WITH PROPOFOL;  Surgeon: Robert Bellow, MD;  Location: [facility];  Service: Endoscopy;  Laterality: N/A;   ESOPHAGOGASTRODUODENOSCOPY (EGD) WITH PROPOFOL N/A 11/14/2017   Procedure: ESOPHAGOGASTRODUODENOSCOPY (EGD) WITH PROPOFOL;  Surgeon: Robert Bellow, MD;  Location: [facility];  Service: Endoscopy;  Laterality: N/A;   throat biopsy     lump removed    Allergies  No Known Allergies  History of Present Illness    76 y/o ? w/ the above complex PMH including nonobs CAD, HTN, HL, tob abuse,  COPD, BPH, and GERD.  He was previously noted to have three-vessel coronary calcification on chest CT in 2017, prompting stress testing March 2017, which was abnormal with mid anterior, anteroseptal, and apical anterior ischemia.  This was followed by diagnostic catheterization which showed heavily calcified LAD with moderate calcification of the RCA but no significant coronary disease.  He has been medically managed for somewhat atypical chest pain since.  Kevin Roberts was last seen in cardiology clinic in 11/2020, at which time he reported some fatigue since being placed on imdur, and this was d/c'd.  Since his last visit, he notes that he has done reasonably well.  Fatigue has resolved.  He is not routinely exercising but remains active throughout the day, running errands and doing things around his home.  He has some degree of dyspnea at higher levels of activity but typically does not experience dyspnea.  He has not had any exertional chest discomfort during the day but approximately 4 times over the past 6 months, he has awakened at night with burning in his chest that resolves within 30 minutes of sitting up.  There are no associated symptoms.  He thinks this is more likely to occur on evenings where he has spicy meals or eats just prior to bedtime.  He takes Protonix daily but has never taken a as needed antacid such as Maalox/Mylanta.  He denies palpitations,  PND, orthopnea, dizziness, syncope, edema, or early satiety.  Home Medications    Current Outpatient Medications  Medication Sig Dispense Refill   ANORO ELLIPTA 62.5-25 MCG/ACT AEPB INHALE 1 PUFF BY MOUTH DAILY 60 each 5   aspirin 81 MG tablet Take 1 tablet by mouth daily.     atorvastatin (LIPITOR) 40 MG tablet TAKE 1 TABLET(40 MG) BY MOUTH DAILY 90 tablet 3   Cyanocobalamin (B-12) 1000 MCG TBCR Take 1 tablet by mouth daily.     gabapentin (NEURONTIN) 300 MG capsule Take 1 capsule (300 mg total) by mouth 2 (two) times daily. 180 capsule 3    MULTIPLE VITAMIN PO Take 1 tablet by mouth daily.     nitroGLYCERIN (NITROSTAT) 0.4 MG SL tablet PLACE 1 TABLET UNDER THE TONGUE AS NEEDED FOR CHEST PAIN EVERY 5 MINTES 25 tablet 1   pantoprazole (PROTONIX) 40 MG tablet TAKE 1 TABLET(40 MG) BY MOUTH DAILY 90 tablet 1   tamsulosin (FLOMAX) 0.4 MG CAPS capsule Take 1 capsule (0.4 mg total) by mouth daily. 90 capsule 3   Vitamin D, Cholecalciferol, 1000 UNITS CAPS Take 1 tablet by mouth daily.     No current facility-administered medications for this visit.     Review of Systems    4 episodes of chest discomfort awakening him from sleep without associated symptoms, lasting about 30 minutes, resolving with sitting up.  He feels that these are GERD related.  He has some dyspnea at higher levels activity but otherwise does well and denies palpitations, PND, orthopnea, dizziness, syncope, claudication, edema, or early satiety.  All other systems reviewed and are otherwise negative except as noted above.    Physical Exam    VS:  BP 120/62 (BP Location: Left Arm, Patient Position: Sitting, Cuff Size: Normal)    Pulse 71    Ht 5\' 11"  (1.803 m)    Wt 159 lb (72.1 kg)    SpO2 97%    BMI 22.18 kg/m  , BMI Body mass index is 22.18 kg/m.     GEN: Thin, in no acute distress. HEENT: normal. Neck: Supple, no JVD, carotid bruits, or masses. Cardiac: RRR, no murmurs, rubs, or gallops. No clubbing, cyanosis, edema.  Radials/PT 2+ and equal bilaterally.  Respiratory:  Respirations regular and unlabored, scattered rhonchi. GI: Soft, nontender, nondistended, BS + x 4. MS: no deformity or atrophy. Skin: warm and dry, no rash. Neuro:  Strength and sensation are intact. Psych: Normal affect.  Accessory Clinical Findings    ECG personally reviewed by me today -regular sinus rhythm, 71, first-degree AV block, nonspecific T changes- no acute changes.  Lab Results  Component Value Date   WBC 7.9 08/17/2020   HGB 15.1 08/17/2020   HCT 45.5 08/17/2020   MCV  92.9 08/17/2020   PLT 152 08/17/2020   Lab Results  Component Value Date   CREATININE 1.52 (H) 08/17/2020   BUN 10 08/17/2020   NA 140 08/17/2020   K 4.2 08/17/2020   CL 106 08/17/2020   CO2 30 08/17/2020   Lab Results  Component Value Date   ALT 14 08/17/2020   AST 21 08/17/2020   ALKPHOS 62 09/20/2016   BILITOT 1.1 08/17/2020   Lab Results  Component Value Date   CHOL 103 08/17/2020   HDL 57 08/17/2020   LDLCALC 30 08/17/2020   TRIG 81 08/17/2020   CHOLHDL 1.8 08/17/2020    Assessment & Plan    1.  Precordial chest pain/nonobstructive CAD: Status post prior evaluation for  exertional angina in 2017 with abnormal stress test and mild, nonobstructive calcified coronary artery disease.  He has been medically managed since and has continued to have intermittent chest discomfort since his diagnostic catheterization.  He did not previously tolerate long-acting nitrates secondary to fatigue.  Historically, discomfort was occurring with activity though over the past 6 months, he said 4 episodes of chest discomfort that awakened him from sleep, without associated symptoms, lasting up to 30 minutes, and resolving with sitting up.  He attributes the symptoms to eating spicy or greasy foods, and/or eating too close to bedtime.  We discussed the symptoms in detail today as well as ongoing risk factors and consideration for additional evaluation, potentially to include coronary CT angiogram (mindful of the CKD 3), and at this time, patient prefers to hold off on any additional testing.  I encouraged him to continue his PPI and consider using as needed Maalox/Mylanta, or other over-the-counter rapid acting antacid for recurrent symptoms.  He has sublingual nitroglycerin to be used as needed as well.  2.  Essential hypertension: Blood pressure is well controlled at 120/62.  He is not on any antihypertensives.  3.  Hyperlipidemia: LDL of 30 in May 2022 with normal LFTs at that time.  He remains on  statin therapy.  4.  Tobacco abuse: Continues to smoke about a pack a day.  He is not currently contemplating quitting.  We discussed cessation aids such as patches, gum, Chantix, and Wellbutrin.  He notes that he has tried patches before and felt like the gum was better, but recognizes that he is not currently at a spot where he feels like he can successfully quit.  I encouraged him to reach out if he would like to try Chantix/Wellbutrin.  5.  COPD: Generally does not experience much dyspnea, except for at higher levels of activity.  No wheezing on exam, though he has scattered rhonchi.  Inhaler therapy per medicine team.  Smoking cessation advised.  6.  Stage III chronic kidney disease: Creatinine was 1.52 last year.  Followed by primary care.  7.  GERD: Patient with a history of GERD on PPI therapy.  As above, has experienced nocturnal precordial chest pain 4 times in the past 6 months.  Symptoms resolved with sitting up and he thinks that symptoms are more likely to occur when he is either eating spicy/greasy foods or too close to bedtime.  We discussed food triggers and eating habits.  Continue PPI.  He will try over-the-counter rapid acting antacid.  8.  Disposition: Follow-up in clinic in 6 months or sooner if necessary.  Murray Hodgkins, NP 06/08/2021, 9:30 AM

## 2021-07-25 ENCOUNTER — Ambulatory Visit: Payer: Medicare Other | Admitting: Family Medicine

## 2021-07-26 NOTE — Progress Notes (Signed)
Name: Kevin Roberts   MRN: [id number]    DOB: 01-Feb-1946   Date:07/27/2021 ? ?     Progress Note ? ?Subjective ? ?Chief Complaint ? ?Follow Up ? ?HPI ? ?COPD Moderate/Emphysema and bronchiectasia  : he is taking Anoro, he denies any cough, wheezing , he is not very active, states SOB only with moderate activity.  He still usually smoking 1.5 pack daily Last CT lung was done 08/2019, no nodules, centrilobular and paraseptal emphysema, scattered areas of bronchiectasis, last CT was in 2022 and order placed for repeat CT today.  He is not ready to quit smoking yet, but he is down to a half pack day if he keeps cigarettes away from himself.  ?  ?History of Alcoholism: he used to drink before retirement - about 2 shots at night, however when he retired at age 3 he started to drink about 1/5 of liquor every two days. He states he stopped drinking because he fell on his face, developed a gastric ulcer and B12 deficiency. He is doing well since he stopped drinking around 2010. He still has tingling on his legs He takes gabapentin and symptoms are controlled, no pain mostly tingling now also function improved, he is able to button his shirts now  ?  ?GERD and history of gastric ulcer:/hiatal hernia   He states symptoms controlled as long as he takes a PPI, no heartburn or regurgitation. He was seen by Dr. Fleet Contras and had EGD in August 2019. He had a flare a couple of weeks ago with heartburn that lasted a couple of hours described as heartburn  , he did not take tums or zantac, but he went to bed and woke up feeling well.  ? ?Hyperlipidemia: taking Atorvastatin and denies side effects. Taking aspirin daily . Denies myalgia.Last LDl was 30, HDL 57  We will recheck labs today  ? ?CAD/Angina : found on CT chest 3 vessel disease he has intermittent chest pain and also has SOB that may may be multifactorial from COPD also. He was seen by Dr. Fletcher Anon and advised to quit smoking. On aspirin, statins and beta-blocker. Cardiac cath in  2017 showed mild to moderate non-obstructive disease and is only on medical management. Recently seen at cardiologist office, he was placed on Imdur but unable to tolerate it . Chest pain still present a few times a month it can last up to 5-10 minutes , he states has not taken NTG lately and is going without taking medication  ?  ?B12 neuropathy: he is taking B12 supplementation and also takes Neurontin , he states pain on hands resolved and on feet only intermittently a couple of times a month but still has tingling but not as severe.  ?  ?Atherosclerosis abdominal aorta: on medical management, aspirin,  and statin therapy. He is still smoking but down from 1.5 pack to about half pack day  ? ?CKI: stage III, he avoiding NSAID's, no pruritis, good urine output , GFR has been stable and we will recheck it today  ? ?Renal Cyst: picked up on CT lung and had follow up renal US 2020  benign cyst . Monitored by Urologist  ? ?Left gynecomastia: we did blood work i that was normal.  No pain or discomfort , CT did not show anything . Unchanged  ? ?BPH: under the care of Urologist , IPSS score was 7 recently, PSA was 2.8 on 01/2021 and he will go back for yearly follow ups, brother had prostate cancer  in his 50's Unchanged  ? ?Patient Active Problem List  ? Diagnosis Date Noted  ? Bronchiectasis without complication ([place]) 37/90/2409  ? History of gastric ulcer 02/17/2020  ? Need for immunization against influenza 02/17/2020  ? Hiatal hernia 01/22/2018  ? Abdominal pain, chronic, right lower quadrant 10/26/2017  ? Esophageal diverticulum 10/26/2017  ? History of alcoholism ([place]) 01/18/2016  ? Gynecomastia 10/14/2015  ? Abnormal nuclear stress test   ? Anginal equivalent ([place])   ? Atherosclerosis of aorta ([place]) 04/15/2015  ? BPH with obstruction/lower urinary tract symptoms 02/03/2015  ? Renal cyst, left 10/13/2014  ? Tobacco use   ? B12 neuropathy ([place]) 10/04/2014  ? CAD, multiple vessel 10/04/2014  ? Benign essential HTN  10/04/2014  ? Chronic kidney disease (CKD), stage III (moderate) ([place]) 10/04/2014  ? Dyslipidemia 10/04/2014  ? COPD, moderate ([place]) 10/04/2014  ? Gastro-esophageal reflux disease without esophagitis 10/04/2014  ? Enlarged prostate 10/04/2014  ? Vitamin D deficiency 10/04/2014  ? ? ?Past Surgical History:  ?Procedure Laterality Date  ? CARDIAC CATHETERIZATION N/A 07/02/2015  ? Procedure: Left Heart Cath and Coronary Angiography;  Surgeon: Wellington Hampshire, MD;  Location: [place] [facility];  Service: Cardiovascular;  Laterality: N/A;  ? COLONOSCOPY  2011  ? [place]  ? COLONOSCOPY WITH PROPOFOL N/A 11/26/2019  ? Procedure: COLONOSCOPY WITH PROPOFOL;  Surgeon: Robert Bellow, MD;  Location: [facility] ENDOSCOPY;  Service: Endoscopy;  Laterality: N/A;  ? ESOPHAGOGASTRODUODENOSCOPY (EGD) WITH PROPOFOL N/A 11/14/2017  ? Procedure: ESOPHAGOGASTRODUODENOSCOPY (EGD) WITH PROPOFOL;  Surgeon: Robert Bellow, MD;  Location: [facility] ENDOSCOPY;  Service: Endoscopy;  Laterality: N/A;  ? throat biopsy    ? lump removed  ? ? ?Family History  ?Problem Relation Age of Onset  ? Hypertension Mother   ? CVA Mother   ? Cancer Father   ?     Lung  ? Mental illness Brother   ?     1-Schizophrenia  ? GER disease Son   ? Healthy Sister   ? Heart Problems Brother   ? Gallbladder disease Brother   ? Healthy Sister   ? Gallbladder disease Brother   ? Hypertension Brother   ? ? ?Social History  ? ?Tobacco Use  ? Smoking status: Every Day  ?  Packs/day: 1.50  ?  Years: 44.00  ?  Pack years: 66.00  ?  Types: Cigarettes  ?  Start date: 10/04/1973  ? Smokeless tobacco: Never  ? Tobacco comments:  ?  [place] Smoking Cessation Information given   ?Substance Use Topics  ? Alcohol use: No  ?  Alcohol/week: 0.0 standard drinks  ? ? ? ?Current Outpatient Medications:  ?  ANORO ELLIPTA 62.5-25 MCG/ACT AEPB, INHALE 1 PUFF BY MOUTH DAILY, Disp: 60 each, Rfl: 5 ?  aspirin 81 MG tablet, Take 1 tablet by mouth daily., Disp: , Rfl:  ?  atorvastatin (LIPITOR) 40 MG  tablet, TAKE 1 TABLET(40 MG) BY MOUTH DAILY, Disp: 90 tablet, Rfl: 3 ?  Cyanocobalamin (B-12) 1000 MCG TBCR, Take 1 tablet by mouth daily., Disp: , Rfl:  ?  gabapentin (NEURONTIN) 300 MG capsule, Take 1 capsule (300 mg total) by mouth 2 (two) times daily., Disp: 180 capsule, Rfl: 3 ?  MULTIPLE VITAMIN PO, Take 1 tablet by mouth daily., Disp: , Rfl:  ?  nitroGLYCERIN (NITROSTAT) 0.4 MG SL tablet, PLACE 1 TABLET UNDER THE TONGUE AS NEEDED FOR CHEST PAIN EVERY 5 MINTES, Disp: 25 tablet, Rfl: 1 ?  pantoprazole (PROTONIX) 40 MG  tablet, TAKE 1 TABLET(40 MG) BY MOUTH DAILY, Disp: 90 tablet, Rfl: 1 ?  tamsulosin (FLOMAX) 0.4 MG CAPS capsule, Take 1 capsule (0.4 mg total) by mouth daily., Disp: 90 capsule, Rfl: 3 ?  Vitamin D, Cholecalciferol, 1000 UNITS CAPS, Take 1 tablet by mouth daily., Disp: , Rfl:  ? ?No Known Allergies ? ?I personally reviewed active problem list, medication list, allergies, family history, social history, health maintenance with the patient/caregiver today. ? ? ?ROS ? ?Constitutional: Negative for fever or weight change.  ?Respiratory: Negative for cough and shortness of breath.   ?Cardiovascular: Negative for chest pain or palpitations.  ?Gastrointestinal: Negative for abdominal pain, no bowel changes.  ?Musculoskeletal: Negative for gait problem or joint swelling.  ?Skin: Negative for rash.  ?Neurological: Negative for dizziness or headache.  ?No other specific complaints in a complete review of systems (except as listed in HPI above).  ? ?Objective ? ?Vitals:  ? 07/27/21 0826  ?BP: 122/66  ?Pulse: 79  ?Resp: 16  ?SpO2: 99%  ?Weight: 157 lb (71.2 kg)  ?Height: '5\' 11"'$  (1.803 m)  ? ? ?Body mass index is 21.9 kg/m?. ? ?Physical Exam ? ?Constitutional: Patient appears well-developed and well-nourished. No distress.  ?HEENT: head atraumatic, normocephalic, pupils equal and reactive to light, neck supple ?Cardiovascular: Normal rate, regular rhythm and normal heart sounds.  No murmur heard. No BLE  edema. ?Pulmonary/Chest: Effort normal and breath sounds normal. No respiratory distress. ?Abdominal: Soft.  There is no tenderness. ?Psychiatric: Patient has a normal mood and affect. behavior is normal. Judgm

## 2021-07-27 ENCOUNTER — Ambulatory Visit (INDEPENDENT_AMBULATORY_CARE_PROVIDER_SITE_OTHER): Payer: Medicare Other | Admitting: Family Medicine

## 2021-07-27 ENCOUNTER — Encounter: Payer: Self-pay | Admitting: Family Medicine

## 2021-07-27 VITALS — BP 122/66 | HR 79 | Resp 16 | Ht 71.0 in | Wt 157.0 lb

## 2021-07-27 DIAGNOSIS — I2510 Atherosclerotic heart disease of native coronary artery without angina pectoris: Secondary | ICD-10-CM | POA: Diagnosis not present

## 2021-07-27 DIAGNOSIS — N1831 Chronic kidney disease, stage 3a: Secondary | ICD-10-CM | POA: Diagnosis not present

## 2021-07-27 DIAGNOSIS — G63 Polyneuropathy in diseases classified elsewhere: Secondary | ICD-10-CM

## 2021-07-27 DIAGNOSIS — I700 Atherosclerosis of aorta: Secondary | ICD-10-CM | POA: Diagnosis not present

## 2021-07-27 DIAGNOSIS — K219 Gastro-esophageal reflux disease without esophagitis: Secondary | ICD-10-CM

## 2021-07-27 DIAGNOSIS — F1021 Alcohol dependence, in remission: Secondary | ICD-10-CM

## 2021-07-27 DIAGNOSIS — J449 Chronic obstructive pulmonary disease, unspecified: Secondary | ICD-10-CM

## 2021-07-27 DIAGNOSIS — Z8711 Personal history of peptic ulcer disease: Secondary | ICD-10-CM

## 2021-07-27 DIAGNOSIS — I208 Other forms of angina pectoris: Secondary | ICD-10-CM | POA: Diagnosis not present

## 2021-07-27 DIAGNOSIS — J432 Centrilobular emphysema: Secondary | ICD-10-CM | POA: Diagnosis not present

## 2021-07-27 DIAGNOSIS — E785 Hyperlipidemia, unspecified: Secondary | ICD-10-CM

## 2021-07-27 DIAGNOSIS — J479 Bronchiectasis, uncomplicated: Secondary | ICD-10-CM

## 2021-07-27 DIAGNOSIS — E538 Deficiency of other specified B group vitamins: Secondary | ICD-10-CM | POA: Diagnosis not present

## 2021-07-27 DIAGNOSIS — Z122 Encounter for screening for malignant neoplasm of respiratory organs: Secondary | ICD-10-CM

## 2021-07-27 MED ORDER — GABAPENTIN 300 MG PO CAPS: 300.0000 mg | ORAL_CAPSULE | Freq: Two times a day (BID) | ORAL | 3 refills | Status: DC

## 2021-07-27 MED ORDER — PANTOPRAZOLE SODIUM 40 MG PO TBEC: 40.0000 mg | DELAYED_RELEASE_TABLET | Freq: Every day | ORAL | 1 refills | Status: DC

## 2021-07-27 MED ORDER — ANORO ELLIPTA 62.5-25 MCG/ACT IN AEPB: INHALATION_SPRAY | RESPIRATORY_TRACT | 5 refills | Status: DC

## 2021-07-28 LAB — COMPLETE METABOLIC PANEL WITH GFR
AG Ratio: 1.8 (calc) (ref 1.0–2.5)
ALT: 15 U/L (ref 9–46)
AST: 21 U/L (ref 10–35)
Albumin: 4.2 g/dL (ref 3.6–5.1)
Alkaline phosphatase (APISO): 55 U/L (ref 35–144)
BUN/Creatinine Ratio: 4 (calc) — ABNORMAL LOW (ref 6–22)
BUN: 7 mg/dL (ref 7–25)
CO2: 28 mmol/L (ref 20–32)
Calcium: 9.3 mg/dL (ref 8.6–10.3)
Chloride: 109 mmol/L (ref 98–110)
Creat: 1.56 mg/dL — ABNORMAL HIGH (ref 0.70–1.28)
Globulin: 2.4 g/dL (calc) (ref 1.9–3.7)
Glucose, Bld: 89 mg/dL (ref 65–99)
Potassium: 4.1 mmol/L (ref 3.5–5.3)
Sodium: 141 mmol/L (ref 135–146)
Total Bilirubin: 0.9 mg/dL (ref 0.2–1.2)
Total Protein: 6.6 g/dL (ref 6.1–8.1)
eGFR: 46 mL/min/{1.73_m2} — ABNORMAL LOW (ref >=60)

## 2021-07-28 LAB — CBC WITH DIFFERENTIAL/PLATELET
Absolute Monocytes: 519 cells/uL (ref 200–950)
Basophils Absolute: 30 cells/uL (ref 0–200)
Basophils Relative: 0.5 %
Eosinophils Absolute: 71 cells/uL (ref 15–500)
Eosinophils Relative: 1.2 %
HCT: 43.1 % (ref 38.5–50.0)
Hemoglobin: 14.6 g/dL (ref 13.2–17.1)
Lymphs Abs: 1764 cells/uL (ref 850–3900)
MCH: 31.5 pg (ref 27.0–33.0)
MCHC: 33.9 g/dL (ref 32.0–36.0)
MCV: 92.9 fL (ref 80.0–100.0)
MPV: 12.8 fL — ABNORMAL HIGH (ref 7.5–12.5)
Monocytes Relative: 8.8 %
Neutro Abs: 3516 cells/uL (ref 1500–7800)
Neutrophils Relative %: 59.6 %
Platelets: 151 10*3/uL (ref 140–400)
RBC: 4.64 10*6/uL (ref 4.20–5.80)
RDW: 12.1 % (ref 11.0–15.0)
Total Lymphocyte: 29.9 %
WBC: 5.9 10*3/uL (ref 3.8–10.8)

## 2021-07-28 LAB — LIPID PANEL
Cholesterol: 88 mg/dL (ref <200)
HDL: 49 mg/dL (ref >=40)
LDL Cholesterol (Calc): 23 mg/dL (calc)
Non-HDL Cholesterol (Calc): 39 mg/dL (calc) (ref <130)
Total CHOL/HDL Ratio: 1.8 (calc) (ref <5.0)
Triglycerides: 76 mg/dL (ref <150)

## 2021-07-28 LAB — B12 AND FOLATE PANEL
Folate: 18.7 ng/mL
Vitamin B-12: 613 pg/mL (ref 200–1100)

## 2021-09-07 ENCOUNTER — Telehealth: Payer: Self-pay | Admitting: Acute Care

## 2021-09-07 NOTE — Telephone Encounter (Signed)
Left VM and call back information to call and schedule yearly LDCT

## 2021-09-26 ENCOUNTER — Other Ambulatory Visit: Payer: Self-pay | Admitting: *Deleted

## 2021-09-26 DIAGNOSIS — Z122 Encounter for screening for malignant neoplasm of respiratory organs: Secondary | ICD-10-CM

## 2021-09-26 DIAGNOSIS — F17210 Nicotine dependence, cigarettes, uncomplicated: Secondary | ICD-10-CM

## 2021-09-26 DIAGNOSIS — Z87891 Personal history of nicotine dependence: Secondary | ICD-10-CM

## 2021-10-17 ENCOUNTER — Ambulatory Visit
Admission: RE | Admit: 2021-10-17 | Discharge: 2021-10-17 | Disposition: A | Discharge status: Home / Self Care | Payer: Medicare Other | Source: Ambulatory Visit | Attending: Internal Medicine | Admitting: Internal Medicine

## 2021-10-17 DIAGNOSIS — F17210 Nicotine dependence, cigarettes, uncomplicated: Secondary | ICD-10-CM | POA: Insufficient documentation

## 2021-10-17 DIAGNOSIS — Z87891 Personal history of nicotine dependence: Secondary | ICD-10-CM | POA: Insufficient documentation

## 2021-10-17 DIAGNOSIS — Z122 Encounter for screening for malignant neoplasm of respiratory organs: Secondary | ICD-10-CM | POA: Insufficient documentation

## 2021-10-18 ENCOUNTER — Encounter: Payer: Self-pay | Admitting: Family Medicine

## 2021-10-18 ENCOUNTER — Ambulatory Visit (INDEPENDENT_AMBULATORY_CARE_PROVIDER_SITE_OTHER): Payer: Medicare Other | Admitting: Family Medicine

## 2021-10-18 VITALS — BP 130/66 | HR 85 | Resp 16 | Ht 71.0 in | Wt 157.0 lb

## 2021-10-18 DIAGNOSIS — Z0000 Encounter for general adult medical examination without abnormal findings: Secondary | ICD-10-CM

## 2021-10-18 NOTE — Progress Notes (Signed)
Annual Wellness Visit    Patient: Kevin Roberts, Male    DOB: 1945-10-13, 77 y.o.   MRN: [id number]  Subjective  Chief Complaint  Patient presents with   Medicare Wellness    Kevin Roberts is a 76 y.o. male who presents today for his Annual Wellness Visit. He reports consuming a general diet.  Active at home, no formal exercise regimen.  He generally feels well. He reports sleeping well. He does not have additional problems to discuss today.    Vision:Within last year Dental: years ago. Full dentures on top, partial on bottom. Doing well.  PSA: 01/2021 2.8  Patient Active Problem List   Diagnosis Date Noted   Bronchiectasis without complication ([place]) 65/06/5463   History of gastric ulcer 02/17/2020   Need for immunization against influenza 02/17/2020   Hiatal hernia 01/22/2018   Abdominal pain, chronic, right lower quadrant 10/26/2017   Esophageal diverticulum 10/26/2017   History of alcoholism ([place]) 01/18/2016   Gynecomastia 10/14/2015   Abnormal nuclear stress test    Anginal equivalent ([place])    Atherosclerosis of aorta ([place]) 04/15/2015   BPH with obstruction/lower urinary tract symptoms 02/03/2015   Renal cyst, left 10/13/2014   Tobacco use    B12 neuropathy ([place]) 10/04/2014   CAD, multiple vessel 10/04/2014   Benign essential HTN 10/04/2014   Chronic kidney disease (CKD), stage III (moderate) ([place]) 10/04/2014   Dyslipidemia 10/04/2014   COPD, moderate ([place]) 10/04/2014   Gastro-esophageal reflux disease without esophagitis 10/04/2014   Enlarged prostate 10/04/2014   Vitamin D deficiency 10/04/2014      Medications: Outpatient Medications Prior to Visit  Medication Sig   aspirin 81 MG tablet Take 1 tablet by mouth daily.   atorvastatin (LIPITOR) 40 MG tablet TAKE 1 TABLET(40 MG) BY MOUTH DAILY   Cyanocobalamin (B-12) 1000 MCG TBCR Take 1 tablet by mouth daily.   gabapentin (NEURONTIN) 300 MG capsule Take 1 capsule (300 mg total) by mouth 2 (two) times daily.    MULTIPLE VITAMIN PO Take 1 tablet by mouth daily.   nitroGLYCERIN (NITROSTAT) 0.4 MG SL tablet PLACE 1 TABLET UNDER THE TONGUE AS NEEDED FOR CHEST PAIN EVERY 5 MINTES   pantoprazole (PROTONIX) 40 MG tablet Take 1 tablet (40 mg total) by mouth daily.   tamsulosin (FLOMAX) 0.4 MG CAPS capsule Take 1 capsule (0.4 mg total) by mouth daily.   umeclidinium-vilanterol (ANORO ELLIPTA) 62.5-25 MCG/ACT AEPB INHALE 1 PUFF BY MOUTH DAILY   Vitamin D, Cholecalciferol, 1000 UNITS CAPS Take 1 tablet by mouth daily.   No facility-administered medications prior to visit.    No Known Allergies  Patient Care Team: Steele Sizer, MD as PCP - General (Family Medicine) Wellington Hampshire, MD as PCP - Cardiology (Cardiology) Wellington Hampshire, MD as Consulting Physician (Cardiology) Bary Castilla Forest Gleason, MD as Consulting Physician (General Surgery) Laneta Simmers as Physician Assistant (Urology)        Objective  BP 130/66   Pulse 85   Resp 16   Ht '5\' 11"'$  (1.803 m)   Wt 157 lb (71.2 kg)   SpO2 98%   BMI 21.90 kg/m    Physical Exam Constitutional:      Appearance: He is normal weight.  HENT:     Head: Normocephalic.     Right Ear: External ear normal. There is impacted cerumen.     Left Ear: External ear normal. There is impacted cerumen.     Nose: Nose normal.  Mouth/Throat:     Mouth: Mucous membranes are moist.     Pharynx: Oropharynx is clear.  Eyes:     Extraocular Movements: Extraocular movements intact.     Pupils: Pupils are equal, round, and reactive to light.  Cardiovascular:     Rate and Rhythm: Normal rate and regular rhythm.     Heart sounds: Normal heart sounds. No murmur heard. Pulmonary:     Effort: Pulmonary effort is normal.     Breath sounds: Normal breath sounds.  Abdominal:     General: Bowel sounds are normal.     Palpations: Abdomen is soft.     Tenderness: There is no abdominal tenderness.  Musculoskeletal:        General: Normal range of  motion.     Right lower leg: No edema.     Left lower leg: No edema.  Lymphadenopathy:     Cervical: No cervical adenopathy.  Skin:    General: Skin is warm and dry.  Neurological:     Mental Status: He is alert and oriented to person, place, and time. Mental status is at baseline.     Gait: Gait normal.  Psychiatric:        Mood and Affect: Mood normal.        Behavior: Behavior normal.       Most recent functional status assessment:    10/18/2021    8:27 AM  In your present state of health, do you have any difficulty performing the following activities:  Hearing? 0  Vision? 0  Difficulty concentrating or making decisions? 1  Walking or climbing stairs? 0  Dressing or bathing? 0  Doing errands, shopping? 0  Preparing Food and eating ? N  Using the Toilet? N  In the past six months, have you accidently leaked urine? Y  Comment only when drinking coffee  Do you have problems with loss of bowel control? N  Managing your Medications? N  Managing your Finances? N  Housekeeping or managing your Housekeeping? N   Most recent fall risk assessment:    10/18/2021    8:27 AM  Fall Risk   Falls in the past year? 0  Number falls in past yr: 0  Injury with Fall? 0  Risk for fall due to : No Fall Risks  Follow up Falls prevention discussed    Most recent depression screenings:    10/18/2021    8:27 AM 07/27/2021    8:26 AM  PHQ 2/9 Scores  PHQ - 2 Score 0 0  PHQ- 9 Score 0 0   Most recent cognitive screening:    10/18/2021    8:53 AM  6CIT Screen  What Year? 0 points  What month? 0 points  What time? 0 points  Count back from 20 2 points  Months in reverse 0 points  Repeat phrase 0 points  Total Score 2 points   Most recent Audit-C alcohol use screening    10/18/2021    8:56 AM  Alcohol Use Disorder Test (AUDIT)  1. How often do you have a drink containing alcohol? 0  2. How many drinks containing alcohol do you have on a typical day when you are drinking? 0   3. How often do you have six or more drinks on one occasion? 0  AUDIT-C Score 0   A score of 3 or more in women, and 4 or more in men indicates increased risk for alcohol abuse, EXCEPT if all of the points are  from question 1   Vision/Hearing Screen: No results found.    No results found for any visits on 10/18/21.    Assessment & Plan   Annual wellness visit done today including the all of the following: Reviewed patient's Family Medical History Reviewed and updated list of patient's medical providers Assessment of cognitive impairment was done Assessed patient's functional ability Established a written schedule for health screening [place] Completed and Reviewed  Exercise Activities and Dietary recommendations  Goals      DIET - INCREASE WATER INTAKE     Recommend to drink at least 6-8 8oz glasses of water per day.     Quit Smoking     If you wish to quit smoking, help is available. For free tobacco cessation program offerings call the [facility] at (416)443-8957 or Live Well Line at [phone]. You may also visit [email or web address] or email [email or web address] for more information on other programs.          Immunization History  Administered Date(s) Administered   Fluad Quad(high Dose 65+) 11/29/2018, 02/17/2020, 02/17/2021   Influenza Split 01/13/2009   Influenza, High Dose Seasonal PF 01/18/2016, 01/22/2017, 01/22/2018   Influenza, Seasonal, Injecte, Preservative Fre 01/31/2011   Influenza,inj,Quad PF,6+ Mos 02/21/2013, 03/02/2014, 11/25/2014   Influenza-Unspecified 03/02/2014   PFIZER(Purple Top)SARS-COV-2 Vaccination 06/05/2019, 07/02/2019, 03/25/2020   Pfizer Covid-19 Vaccine Bivalent Booster 79yr & up 04/08/2021   Pneumococcal Conjugate-13 06/30/2014   Pneumococcal Polysaccharide-23 06/14/2009, 10/14/2015   Pneumococcal-Unspecified 06/14/2009   Td 11/10/2008   Tdap 11/10/2008, 03/23/2021   Zoster  Recombinat (Shingrix) 11/30/2020, 03/04/2021   Zoster, Live 10/03/2011    Health Maintenance  Topic Date Due   COVID-19 Vaccine (5 - Pfizer series) 08/07/2021   INFLUENZA VACCINE  11/08/2021   COLONOSCOPY (Pts 45-467yrInsurance coverage will need to be confirmed)  11/26/2022   TETANUS/TDAP  03/24/2031   Pneumonia Vaccine 6517Years old  Completed   Hepatitis C Screening  Completed   Zoster Vaccines- Shingrix  Completed   HPV VACCINES  Aged Out     Discussed health benefits of physical activity, and encouraged him to engage in regular exercise appropriate for his age and condition.    Problem List Items Addressed This Visit   None Visit Diagnoses     Encounter for Medicare annual wellness exam    -  Primary       Return in about 1 year (around 10/19/2022) for medicare wellness.     AlMyles GipDO

## 2021-10-18 NOTE — Patient Instructions (Signed)
  Mr. Kevin Roberts , Thank you for taking time to come for your Medicare Wellness Visit. I appreciate your ongoing commitment to your health goals. Please review the following plan we discussed and let me know if I can assist you in the future.   These are the goals we discussed:  Goals      DIET - INCREASE WATER INTAKE     Recommend to drink at least 6-8 8oz glasses of water per day.     Quit Smoking     If you wish to quit smoking, help is available. For free tobacco cessation program offerings call the [facility] at 614 635 0395 or Live Well Line at [phone]. You may also visit [email or web address] or email [email or web address] for more information on other programs.          This is a list of the screening recommended for you and due dates:  Health Maintenance  Topic Date Due   COVID-19 Vaccine (5 - Pfizer series) 08/07/2021   Flu Shot  11/08/2021   Colon Cancer Screening  11/26/2022   Tetanus Vaccine  03/24/2031   Pneumonia Vaccine  Completed   Hepatitis C Screening: USPSTF Recommendation to screen - Ages 18-79 yo.  Completed   Zoster (Shingles) Vaccine  Completed   HPV Vaccine  Aged Out   Look for opportunities to move your body throughout your day:  Never lie down when you can sit; never sit when you can stand; never stand when you can pace.  Moving your body throughout the day is just as important as the 30 or 60 minutes of exercise at the gym!  Get social Get active with your friends instead of going out to eat. Go for a hike, walk around the mall, or play an exercise-themed video game.   Move more at work Fit more activity into the workday. Stand during phone calls, use a printer farther from your desk, and get up to stretch each hour.    Do something new Develop a new skill to kick-start your motivation. Sign up for a class to learn how to [employer], surf, do tai chi, or play a sport.    Keep cool in the pool Don't like to sweat? Hit the  local community pool for a swim, water polo, or water aerobics class to stay cool while exercising.    Stay on track Use a fitness tracker (FITBIT, Fitness Pal mobile app) to track your activity and provide motivation to reach your goals.   Here is an example of what a healthy plate looks like:    ? Make half your plate fruits and vegetables.     ? Focus on whole fruits.     ? Vary your veggies.  ? Make half your grains whole grains. -     ? Look for the word "whole" at the beginning of the ingredients list    ? Some whole-grain ingredients include whole oats, whole-wheat flour,        whole-grain corn, whole-grain brown rice, and whole rye.  ? Move to low-fat and fat-free milk or yogurt.  ? Vary your protein routine. - Meat, fish, poultry (chicken, [place]), eggs, beans (kidney, pinto), dairy.  ? Drink and eat less sodium, saturated fat, and added sugars.

## 2021-10-19 ENCOUNTER — Other Ambulatory Visit: Payer: Self-pay

## 2021-10-19 DIAGNOSIS — Z122 Encounter for screening for malignant neoplasm of respiratory organs: Secondary | ICD-10-CM

## 2021-10-19 DIAGNOSIS — F17210 Nicotine dependence, cigarettes, uncomplicated: Secondary | ICD-10-CM

## 2021-10-19 DIAGNOSIS — Z87891 Personal history of nicotine dependence: Secondary | ICD-10-CM

## 2021-12-08 ENCOUNTER — Ambulatory Visit: Payer: Medicare Other | Attending: Nurse Practitioner | Admitting: Nurse Practitioner

## 2021-12-08 ENCOUNTER — Encounter: Payer: Self-pay | Admitting: Nurse Practitioner

## 2021-12-08 VITALS — BP 124/76 | HR 78 | Ht 71.0 in | Wt 157.0 lb

## 2021-12-08 DIAGNOSIS — I10 Essential (primary) hypertension: Secondary | ICD-10-CM | POA: Insufficient documentation

## 2021-12-08 DIAGNOSIS — I2510 Atherosclerotic heart disease of native coronary artery without angina pectoris: Secondary | ICD-10-CM | POA: Diagnosis not present

## 2021-12-08 DIAGNOSIS — I25119 Atherosclerotic heart disease of native coronary artery with unspecified angina pectoris: Secondary | ICD-10-CM | POA: Insufficient documentation

## 2021-12-08 DIAGNOSIS — J449 Chronic obstructive pulmonary disease, unspecified: Secondary | ICD-10-CM | POA: Insufficient documentation

## 2021-12-08 DIAGNOSIS — K219 Gastro-esophageal reflux disease without esophagitis: Secondary | ICD-10-CM | POA: Diagnosis not present

## 2021-12-08 DIAGNOSIS — R001 Bradycardia, unspecified: Secondary | ICD-10-CM | POA: Diagnosis not present

## 2021-12-08 DIAGNOSIS — Z720 Tobacco use: Secondary | ICD-10-CM | POA: Insufficient documentation

## 2021-12-08 DIAGNOSIS — N183 Chronic kidney disease, stage 3 unspecified: Secondary | ICD-10-CM | POA: Diagnosis not present

## 2021-12-08 DIAGNOSIS — E782 Mixed hyperlipidemia: Secondary | ICD-10-CM | POA: Insufficient documentation

## 2021-12-08 NOTE — Progress Notes (Signed)
Office Visit    Patient Name: Kevin Roberts Date of Encounter: 12/08/2021  Primary Care Provider:  Steele Sizer, MD Primary Cardiologist:  [name], MD  Chief Complaint    76 y/o ? w/ a h/o nonobs CAD, HTN, HL, CKD III, tob abuse, COPD, BPH, and GERD, who presents for f/u related to chest pain.  Past Medical History    Past Medical History:  Diagnosis Date   Benign prostatic hypertrophy    Chronic kidney disease, stage III (moderate) (HCC)    COPD (chronic obstructive pulmonary disease) (HCC)    Cramps, muscle, general    Elevated ferritin    Emphysema of lung (HCC)    GERD (gastroesophageal reflux disease)    History of hypercalcemia    Hyperlipidemia    Hypertension    Incomplete bladder emptying    Nocturia    Nonobstructive CAD    a. 06/2015 MV: mid ant/antseptal/apical anterior ischemia; b. 06/2015 Cath: LM nl, LAD Ca2+, LCX min irregs, OM1/2/3 nl, RCA mod Ca2+, RPDA/RPAV/RPL1,2,3 nl. EF 55-65%.   Peripheral polyneuropathy    Rising PSA level    Tobacco use    Urgency of micturation    Vitamin B 12 deficiency    Past Surgical History:  Procedure Laterality Date   CARDIAC CATHETERIZATION N/A 07/02/2015   Procedure: Left Heart Cath and Coronary Angiography;  Surgeon: Wellington Hampshire, MD;  Location: [facility];  Service: Cardiovascular;  Laterality: N/A;   COLONOSCOPY  2011   JWB   COLONOSCOPY WITH PROPOFOL N/A 11/26/2019   Procedure: COLONOSCOPY WITH PROPOFOL;  Surgeon: Robert Bellow, MD;  Location: [facility];  Service: Endoscopy;  Laterality: N/A;   ESOPHAGOGASTRODUODENOSCOPY (EGD) WITH PROPOFOL N/A 11/14/2017   Procedure: ESOPHAGOGASTRODUODENOSCOPY (EGD) WITH PROPOFOL;  Surgeon: Robert Bellow, MD;  Location: [facility];  Service: Endoscopy;  Laterality: N/A;   throat biopsy     lump removed    Allergies  No Known Allergies  History of Present Illness    76 y/o ? w/ the above complex PMH including nonobs CAD, HTN, HL,  CKD III, tob abuse, COPD, BPH, and GERD.  He was prev noted to have 3 vessel cor Ca2+ on chest CT in 2017, prompting stress testing in March 2017, which was abnormal w/ mid anterior, anteroseptal, and apical anterior ischemia.  This was followed by diagnostic cath which showed heavily calcified LAD w/ mod calcification of the RCA, but no significant CAD.  He has been medically managed for somewhat atypical c/p since.  In 11/2020, he reported fatigue and was placed on imdur, which was subsequently d/c'd due to intolerance.  He was last seen in clinic in March 2023, at which time he reported 4 episodes of chest discomfort that awakened him at night, lasting about 30 minutes, resolving with sitting up.  Symptoms seem to be tied to eating spicy meals.  He wished to defer any additional ischemic testing.  Over the past few months, Kevin Roberts notes that he has been feeling well.  He walks most days of the week and though he notes that he has slowed down some related to dyspnea, he feels that overall, his symptoms are stable.  He has not been experiencing any chest pain and notes that he is made some dietary changes.  He continues to smoke 1 pack/day and is not currently motivated to quit.  He denies palpitations, PND, orthopnea, dizziness, syncope, edema, or early satiety.  He is bradycardic today at 49  bpm with known first-degree AV block.  Normal chronotropic competence with ambulation.  Home Medications    Current Outpatient Medications  Medication Sig Dispense Refill   aspirin 81 MG tablet Take 1 tablet by mouth daily.     atorvastatin (LIPITOR) 40 MG tablet TAKE 1 TABLET(40 MG) BY MOUTH DAILY (Patient taking differently: Take 40 mg by mouth daily.) 90 tablet 3   Cyanocobalamin (B-12) 1000 MCG TBCR Take 1 tablet by mouth daily.     gabapentin (NEURONTIN) 300 MG capsule Take 1 capsule (300 mg total) by mouth 2 (two) times daily. 180 capsule 3   MULTIPLE VITAMIN PO Take 1 tablet by mouth daily.      nitroGLYCERIN (NITROSTAT) 0.4 MG SL tablet PLACE 1 TABLET UNDER THE TONGUE AS NEEDED FOR CHEST PAIN EVERY 5 MINTES (Patient taking differently: Place 0.4 mg under the tongue every 5 (five) minutes as needed for chest pain.) 25 tablet 1   pantoprazole (PROTONIX) 40 MG tablet Take 1 tablet (40 mg total) by mouth daily. 90 tablet 1   tamsulosin (FLOMAX) 0.4 MG CAPS capsule Take 1 capsule (0.4 mg total) by mouth daily. 90 capsule 3   umeclidinium-vilanterol (ANORO ELLIPTA) 62.5-25 MCG/ACT AEPB INHALE 1 PUFF BY MOUTH DAILY (Patient taking differently: Inhale 1 puff into the lungs daily. INHALE 1 PUFF BY MOUTH DAILY) 60 each 5   Vitamin D, Cholecalciferol, 1000 UNITS CAPS Take 1 tablet by mouth daily.     No current facility-administered medications for this visit.     Review of Systems    Some degree of chronic dyspnea exertion.  He denies chest pain, palpitations, PND, orthopnea, dizziness, syncope, edema, or early satiety.  All other systems reviewed and are otherwise negative except as noted above.    Physical Exam    VS:  BP 124/76 (BP Location: Left Arm)   Pulse 78   Ht '5\' 11"'$  (1.803 m)   Wt 157 lb (71.2 kg)   SpO2 98%   BMI 21.90 kg/m  , BMI Body mass index is 21.9 kg/m.     GEN: Well nourished, well developed, in no acute distress. HEENT: normal. Neck: Supple, no JVD, carotid bruits, or masses. Cardiac: RRR, no murmurs, rubs, or gallops. No clubbing, cyanosis, edema.  Radials/DP/PT 2+ and equal bilaterally.  Respiratory:  Respirations regular and unlabored, diminished breath sounds bilaterally. GI: Soft, nontender, nondistended, BS + x 4. MS: no deformity or atrophy. Skin: warm and dry, no rash. Neuro:  Strength and sensation are intact. Psych: Normal affect.  Accessory Clinical Findings    ECG personally reviewed by me today -sinus bradycardia, 49, first-degree AV block- no acute changes.  Lab Results  Component Value Date   WBC 5.9 07/27/2021   HGB 14.6 07/27/2021    HCT 43.1 07/27/2021   MCV 92.9 07/27/2021   PLT 151 07/27/2021   Lab Results  Component Value Date   CREATININE 1.56 (H) 07/27/2021   BUN 7 07/27/2021   NA 141 07/27/2021   K 4.1 07/27/2021   CL 109 07/27/2021   CO2 28 07/27/2021   Lab Results  Component Value Date   ALT 15 07/27/2021   AST 21 07/27/2021   ALKPHOS 62 09/20/2016   BILITOT 0.9 07/27/2021   Lab Results  Component Value Date   CHOL 88 07/27/2021   HDL 49 07/27/2021   LDLCALC 23 07/27/2021   TRIG 76 07/27/2021   CHOLHDL 1.8 07/27/2021    Assessment & Plan    1.  Nonobstructive CAD/history  of chest pain: Status post prior evaluation for exertional angina 2017, with abnormal stress test and mild, nonobstructive calcified coronary artery disease.  He has had intermittent chest discomfort since though notes that over the past few months, he has been feeling well without any chest discomfort.  He made some dietary changes and reduced his spicy and greasy food intake, as these seem to be culprits for his symptoms in the past.  He is already on a PPI.  He previously did not tolerate long-acting nitrates.  He remains on aspirin and statin therapy.  No additional testing warranted at this time.  2.  Essential hypertension: Blood pressure controlled at 124/76.  He is not on any antihypertensives.  3.  Hyperlipidemia: LDL of 23 in April with normal LFTs at that time.  He remains on atorvastatin therapy.  4.  Sinus bradycardia/first-degree AV block: Patient with a history of first-degree AV block and is bradycardic today at 49 bpm.  No evidence of high-grade heart block or conduction disturbances.  He is not on any AV nodal blocking agents.  He did have normal chronotropic competence with ambulation and denies any history of presyncope or syncope.  Follow-up.  5.  COPD/tobacco abuse: He has some degree of chronic dyspnea on exertion at higher levels of activity but overall has been stable.  Has diminished breath sounds but is  not actively wheezing.  He continues to smoke 1 pack of cigarettes per day.  We discussed smoking cessation however he admits to not currently contemplating quitting.  Cessation strongly advised.  6.  Stage III chronic kidney disease: Creatinine 0.56 in April.  7.  GERD: Stable symptoms on PPI therapy since changing his diet some and reducing spicy and greasy foods.  8.  Disposition: Follow-up in clinic in 6 months or sooner if necessary.   Murray Hodgkins, NP 12/08/2021, 9:27 AM

## 2021-12-08 NOTE — Patient Instructions (Signed)
Medication Instructions:   NONE  *If you need a refill on your cardiac medications before your next appointment, please call your pharmacy*   Lab Work:  NONE  If you have labs (blood work) drawn today and your tests are completely normal, you will receive your results only by: [place] (if you have MyChart) OR A paper copy in the mail If you have any lab test that is abnormal or we need to change your treatment, we will call you to review the results.   Testing/Procedures:  NONE   Follow-Up: At [facility], you and your health needs are our priority.  As part of our continuing mission to provide you with exceptional heart care, we have created designated Provider Care Teams.  These Care Teams include your primary Cardiologist (physician) and Advanced Practice Providers (APPs -  Physician Assistants and Nurse Practitioners) who all work together to provide you with the care you need, when you need it.  We recommend signing up for the patient portal called "MyChart".  Sign up information is provided on this After Visit Summary.  MyChart is used to connect with patients for Virtual Visits (Telemedicine).  Patients are able to view lab/test results, encounter notes, upcoming appointments, etc.  Non-urgent messages can be sent to your provider as well.   To learn more about what you can do with MyChart, go to [email or web address].    Your next appointment:   6 month(s)  The format for your next appointment:   In Person  Provider:   You may see [name], MD or one of the following Advanced Practice Providers on your designated Care Team:   Murray Hodgkins, NP Christell Faith, PA-C Cadence Kathlen Mody, PA-C Gerrie Nordmann, NP Important Information About Sugar

## 2021-12-19 ENCOUNTER — Other Ambulatory Visit: Payer: Self-pay | Admitting: Family Medicine

## 2021-12-19 DIAGNOSIS — E785 Hyperlipidemia, unspecified: Secondary | ICD-10-CM

## 2022-01-13 ENCOUNTER — Ambulatory Visit (INDEPENDENT_AMBULATORY_CARE_PROVIDER_SITE_OTHER): Payer: Medicare Other

## 2022-01-13 DIAGNOSIS — Z23 Encounter for immunization: Secondary | ICD-10-CM

## 2022-01-25 NOTE — Progress Notes (Unsigned)
Name: Kevin Roberts   MRN: [id number]    DOB: 09-20-1945   Date:01/26/2022       Progress Note  Subjective  Chief Complaint  Follow Up  HPI  COPD Moderate/Emphysema : he is taking Anoro but was skipping doses, currently only using it a few times a week due to cost ( discussed assistance program - he will ask his daughter-in-law to help him out) , he denies any cough, wheezing , he is not very active, states SOB only with moderate activity. He still usually smoking 1.5 pack daily Last CT lung was done 08/2019, no nodules, centrilobular and paraseptal emphysema, he had bronchictasy on CT was in 2022 but on last CT only showed emphysema.  He wants to quit but not able to quit, discussed Chanitx and Bupropion, he is willing to try it    History of Alcoholism: he used to drink before retirement - about 2 shots at night, however when he retired at age 54 he started to drink about 1/5 of liquor every two days. He states he stopped drinking because he fell on his face, developed a gastric ulcer and B12 deficiency. He is doing well since he stopped drinking around 2010. He still has tingling on his legs He takes gabapentin and symptoms are controlled, no pain mostly tingling and function is good, able to button his shirts now    GERD and history of gastric ulcer:/hiatal hernia   He states symptoms controlled as long as he takes a PPI, no heartburn or regurgitation. He was seen by Dr. Fleet Contras and had EGD in August 2019. He states no recent flares   Hyperlipidemia: taking Atorvastatin and denies side effects. Taking aspirin daily . Denies myalgia. LDL has been at goal   CAD/Angina : found on CT chest 3 vessel disease he has intermittent chest pain and also has SOB that may may be multifactorial from COPD also. He was seen by Dr. Fletcher Anon and advised to quit smoking, we will give him Chantix today - discussed possible side effects. On aspirin, statins and beta-blocker. Cardiac cath in 2017 showed mild to  moderate non-obstructive disease and is only on medical management. Chest pain still present a few times a month it can last up to 5-10 minutes , he states has not taken NTG lately, he could not tolerate Imdur    B12 neuropathy: he is taking B12 supplementation and also takes Neurontin , he states pain on hands resolved and on feet only intermittently a couple ,last level was at goal    Atherosclerosis abdominal aorta: on medical management, aspirin,  and statin therapy. He is still smoking  but ready to quit   CKI: stage III, he avoiding NSAID's, no pruritis, good urine output , GFR has been stable , recheck yearly , discussed ACE and or SGL-2 agonist but he wants to try Chantix first to quit smoking   Renal Cyst: picked up on CT lung and had follow up renal US 2020  benign cyst . Sees Urologist yearly   Left gynecomastia: we did blood work i that was normal.  No pain or discomfort , CT did not show anything . Stable   BPH: under the care of Urologist , IPSS score was 7 recently, PSA was 2.8 on 01/2021 and he will go back this month for follow up  Patient Active Problem List   Diagnosis Date Noted   Bronchiectasis without complication ([place]) 96/29/5284   History of gastric ulcer 02/17/2020   Need for  immunization against influenza 02/17/2020   Hiatal hernia 01/22/2018   Abdominal pain, chronic, right lower quadrant 10/26/2017   Esophageal diverticulum 10/26/2017   History of alcoholism ([place]) 01/18/2016   Gynecomastia 10/14/2015   Abnormal nuclear stress test    Anginal equivalent    Atherosclerosis of aorta ([place]) 04/15/2015   BPH with obstruction/lower urinary tract symptoms 02/03/2015   Renal cyst, left 10/13/2014   Tobacco use    B12 neuropathy ([place]) 10/04/2014   CAD, multiple vessel 10/04/2014   Benign essential HTN 10/04/2014   Chronic kidney disease (CKD), stage III (moderate) (HCC) 10/04/2014   Dyslipidemia 10/04/2014   COPD, moderate ([place]) 10/04/2014   Gastro-esophageal  reflux disease without esophagitis 10/04/2014   Enlarged prostate 10/04/2014   Vitamin D deficiency 10/04/2014    Past Surgical History:  Procedure Laterality Date   CARDIAC CATHETERIZATION N/A 07/02/2015   Procedure: Left Heart Cath and Coronary Angiography;  Surgeon: Wellington Hampshire, MD;  Location: [facility];  Service: Cardiovascular;  Laterality: N/A;   COLONOSCOPY  2011   JWB   COLONOSCOPY WITH PROPOFOL N/A 11/26/2019   Procedure: COLONOSCOPY WITH PROPOFOL;  Surgeon: Robert Bellow, MD;  Location: [facility];  Service: Endoscopy;  Laterality: N/A;   ESOPHAGOGASTRODUODENOSCOPY (EGD) WITH PROPOFOL N/A 11/14/2017   Procedure: ESOPHAGOGASTRODUODENOSCOPY (EGD) WITH PROPOFOL;  Surgeon: Robert Bellow, MD;  Location: [facility];  Service: Endoscopy;  Laterality: N/A;   throat biopsy     lump removed    Family History  Problem Relation Age of Onset   Hypertension Mother    CVA Mother    Cancer Father        Lung   Mental illness Brother        1-Schizophrenia   GER disease Son    Healthy Sister    Heart Problems Brother    Gallbladder disease Brother    Healthy Sister    Gallbladder disease Brother    Hypertension Brother     Social History   Tobacco Use   Smoking status: Every Day    Packs/day: 1.50    Years: 44.00    Total pack years: 66.00    Types: Cigarettes    Start date: 10/04/1973   Smokeless tobacco: Never   Tobacco comments:    [place] Smoking Cessation Information given   Substance Use Topics   Alcohol use: No    Alcohol/week: 0.0 standard drinks of alcohol     Current Outpatient Medications:    aspirin 81 MG tablet, Take 1 tablet by mouth daily., Disp: , Rfl:    atorvastatin (LIPITOR) 40 MG tablet, Take 1 tablet (40 mg total) by mouth daily., Disp: 90 tablet, Rfl: 1   Cyanocobalamin (B-12) 1000 MCG TBCR, Take 1 tablet by mouth daily., Disp: , Rfl:    gabapentin (NEURONTIN) 300 MG capsule, Take 1 capsule (300 mg total) by  mouth 2 (two) times daily., Disp: 180 capsule, Rfl: 3   MULTIPLE VITAMIN PO, Take 1 tablet by mouth daily., Disp: , Rfl:    nitroGLYCERIN (NITROSTAT) 0.4 MG SL tablet, PLACE 1 TABLET UNDER THE TONGUE AS NEEDED FOR CHEST PAIN EVERY 5 MINTES, Disp: 25 tablet, Rfl: 1   pantoprazole (PROTONIX) 40 MG tablet, Take 1 tablet (40 mg total) by mouth daily., Disp: 90 tablet, Rfl: 1   tamsulosin (FLOMAX) 0.4 MG CAPS capsule, Take 1 capsule (0.4 mg total) by mouth daily., Disp: 90 capsule, Rfl: 3   umeclidinium-vilanterol (ANORO ELLIPTA) 62.5-25 MCG/ACT AEPB, INHALE 1  PUFF BY MOUTH DAILY, Disp: 60 each, Rfl: 5   Vitamin D, Cholecalciferol, 1000 UNITS CAPS, Take 1 tablet by mouth daily., Disp: , Rfl:   No Known Allergies  I personally reviewed active problem list, medication list, allergies, family history, social history, health maintenance with the patient/caregiver today.   ROS  Constitutional: Negative for fever or weight change.  Respiratory: Negative for cough but has  shortness of breath.   Cardiovascular: Negative for chest pain or palpitations.  Gastrointestinal: Negative for abdominal pain, no bowel changes.  Musculoskeletal: Negative for gait problem or joint swelling.  Skin: Negative for rash.  Neurological: Negative for dizziness or headache.  No other specific complaints in a complete review of systems (except as listed in HPI above).   Objective  Vitals:   01/26/22 0848  BP: 122/68  Pulse: 71  Resp: 16  SpO2: 97%  Weight: 154 lb (69.9 kg)  Height: '5\' 11"'$  (1.803 m)    Body mass index is 21.48 kg/m.  Physical Exam  Constitutional: Patient appears well-developed and well-nourished. No distress.  HEENT: head atraumatic, normocephalic, pupils equal and reactive to light, neck supple Cardiovascular: Normal rate, regular rhythm and normal heart sounds.  No murmur heard. No BLE edema. Pulmonary/Chest: Effort normal and breath sounds normal. No respiratory distress. Abdominal:  Soft.  There is no tenderness. Psychiatric: Patient has a normal mood and affect. behavior is normal. Judgment and thought content normal.    PHQ2/9:    01/26/2022    8:48 AM 10/18/2021    8:27 AM 07/27/2021    8:26 AM 02/17/2021    7:53 AM 10/14/2020    8:50 AM  Depression screen PHQ 2/9  Decreased Interest 0 0 0 0 0  Down, Depressed, Hopeless 0 0 0 0 0  PHQ - 2 Score 0 0 0 0 0  Altered sleeping 0 0 0 0   Tired, decreased energy 1 0 0 0   Change in appetite 0 0 0 0   Feeling bad or failure about yourself  0 0 0 0   Trouble concentrating 0 0 0 0   Moving slowly or fidgety/restless 0 0 0 0   Suicidal thoughts 0 0 0 0   PHQ-9 Score 1 0 0 0     phq 9 is negative   Fall Risk:    01/26/2022    8:48 AM 10/18/2021    8:27 AM 07/27/2021    8:26 AM 02/17/2021    7:52 AM 10/14/2020    8:52 AM  Fall Risk   Falls in the past year? 0 0 0 0 0  Number falls in past yr: 0 0 0 0 0  Injury with Fall? 0 0 0 0 0  Risk for fall due to : No Fall Risks No Fall Risks No Fall Risks No Fall Risks No Fall Risks  Follow up Falls prevention discussed Falls prevention discussed Falls prevention discussed Falls prevention discussed Falls prevention discussed      Functional Status Survey: Is the patient deaf or have difficulty hearing?: No Does the patient have difficulty seeing, even when wearing glasses/contacts?: No Does the patient have difficulty concentrating, remembering, or making decisions?: No Does the patient have difficulty walking or climbing stairs?: No Does the patient have difficulty dressing or bathing?: No Does the patient have difficulty doing errands alone such as visiting a doctor's office or shopping?: No    Assessment & Plan  1. COPD, moderate ([place])  Try to get assistance for Anoro  2. Atherosclerosis of aorta ([place])  On statin therapy   3. Centrilobular emphysema (HCC)  Trying to quit smoking   4. Stage 3a chronic kidney disease (HCC)  Discussed SGL-2 agonist,  but afraid of cost  5. B12 neuropathy (HCC)  Stable   6. History of alcoholism ([place])  In remission  7. CAD, multiple vessel   8. GERD without esophagitis  - pantoprazole (PROTONIX) 40 MG tablet; Take 1 tablet (40 mg total) by mouth daily.  Dispense: 90 tablet; Refill: 1  9. History of gastric ulcer  - pantoprazole (PROTONIX) 40 MG tablet; Take 1 tablet (40 mg total) by mouth daily.  Dispense: 90 tablet; Refill: 1  10. Tobacco use  - varenicline (CHANTIX) 0.5 MG tablet; Take 1 tablet (0.5 mg total) by mouth 2 (two) times daily.  Dispense: 60 tablet; Refill: 0 - varenicline (CHANTIX CONTINUING MONTH PAK) 1 MG tablet; Take 1 tablet (1 mg total) by mouth 2 (two) times daily.  Dispense: 60 tablet; Refill: 4

## 2022-01-26 ENCOUNTER — Encounter: Payer: Self-pay | Admitting: Family Medicine

## 2022-01-26 ENCOUNTER — Ambulatory Visit (INDEPENDENT_AMBULATORY_CARE_PROVIDER_SITE_OTHER): Payer: Medicare Other | Admitting: Family Medicine

## 2022-01-26 VITALS — BP 122/68 | HR 71 | Resp 16 | Ht 71.0 in | Wt 154.0 lb

## 2022-01-26 DIAGNOSIS — Z720 Tobacco use: Secondary | ICD-10-CM

## 2022-01-26 DIAGNOSIS — G63 Polyneuropathy in diseases classified elsewhere: Secondary | ICD-10-CM

## 2022-01-26 DIAGNOSIS — Z8711 Personal history of peptic ulcer disease: Secondary | ICD-10-CM

## 2022-01-26 DIAGNOSIS — I700 Atherosclerosis of aorta: Secondary | ICD-10-CM

## 2022-01-26 DIAGNOSIS — J432 Centrilobular emphysema: Secondary | ICD-10-CM | POA: Diagnosis not present

## 2022-01-26 DIAGNOSIS — I2510 Atherosclerotic heart disease of native coronary artery without angina pectoris: Secondary | ICD-10-CM

## 2022-01-26 DIAGNOSIS — J449 Chronic obstructive pulmonary disease, unspecified: Secondary | ICD-10-CM

## 2022-01-26 DIAGNOSIS — N1831 Chronic kidney disease, stage 3a: Secondary | ICD-10-CM

## 2022-01-26 DIAGNOSIS — E538 Deficiency of other specified B group vitamins: Secondary | ICD-10-CM

## 2022-01-26 DIAGNOSIS — K219 Gastro-esophageal reflux disease without esophagitis: Secondary | ICD-10-CM

## 2022-01-26 DIAGNOSIS — F1021 Alcohol dependence, in remission: Secondary | ICD-10-CM

## 2022-01-26 MED ORDER — PANTOPRAZOLE SODIUM 40 MG PO TBEC: 40.0000 mg | DELAYED_RELEASE_TABLET | Freq: Every day | ORAL | 1 refills | Status: DC

## 2022-01-26 MED ORDER — VARENICLINE TARTRATE 0.5 MG PO TABS: 0.5000 mg | ORAL_TABLET | Freq: Two times a day (BID) | ORAL | 0 refills | Status: DC

## 2022-01-26 MED ORDER — VARENICLINE TARTRATE 1 MG PO TABS: 1.0000 mg | ORAL_TABLET | Freq: Two times a day (BID) | ORAL | 4 refills | Status: DC

## 2022-01-26 NOTE — Patient Instructions (Signed)
Go online and print forms for assistance program to get Anoro for free from the drug manufacture

## 2022-01-30 DIAGNOSIS — H2513 Age-related nuclear cataract, bilateral: Secondary | ICD-10-CM | POA: Diagnosis not present

## 2022-01-30 DIAGNOSIS — H35033 Hypertensive retinopathy, bilateral: Secondary | ICD-10-CM | POA: Diagnosis not present

## 2022-02-06 ENCOUNTER — Other Ambulatory Visit: Payer: Medicare Other

## 2022-02-06 DIAGNOSIS — N138 Other obstructive and reflux uropathy: Secondary | ICD-10-CM | POA: Diagnosis not present

## 2022-02-06 DIAGNOSIS — N401 Enlarged prostate with lower urinary tract symptoms: Secondary | ICD-10-CM

## 2022-02-07 LAB — PSA: Prostate Specific Ag, Serum: 2 ng/mL (ref 0.0–4.0)

## 2022-02-08 NOTE — Progress Notes (Unsigned)
02/09/2022 9:34 AM   Kevin Roberts 1945/09/20 [id number]  Referring provider: Steele Sizer, MD [address] [place],  [place] [address]  Urological history: 1. BPH with LU TS -PSA (01/2022) 2.0 -I PSS 7/2 -tamsulosin 0.4 mg daily  2. Family history of prostate cancer -baby brother has been diagnosed with prostate cancer, he is in his late 70's  3. Renal cyst -RUS (2020) - left multiple cysts are noted measuring up to approximately 0.9 cm.  Chief Complaint  Patient presents with   Benign Prostatic Hypertrophy     HPI: Kevin Roberts is a 76 y.o. male who presents today for yearly follow up.  He has no urinary complaints.  Patient denies any modifying or aggravating factors.  Patient denies any gross hematuria, dysuria or suprapubic/flank pain.  Patient denies any fevers, chills, nausea or vomiting.     IPSS     Row Name 02/09/22 0900         International Prostate Symptom Score   How often have you had the sensation of not emptying your bladder? Less than 1 in 5     How often have you had to urinate less than every two hours? Less than 1 in 5 times     How often have you found you stopped and started again several times when you urinated? Less than 1 in 5 times     How often have you found it difficult to postpone urination? Less than 1 in 5 times     How often have you had a weak urinary stream? Less than 1 in 5 times     How often have you had to strain to start urination? Less than 1 in 5 times     How many times did you typically get up at night to urinate? 1 Time     Total IPSS Score 7       Quality of Life due to urinary symptoms   If you were to spend the rest of your life with your urinary condition just the way it is now how would you feel about that? Mostly Satisfied                Score:  1-7 Mild 8-19 Moderate 20-35 Severe   PMH: Past Medical History:  Diagnosis Date   Benign prostatic hypertrophy    Chronic kidney  disease, stage III (moderate) (HCC)    COPD (chronic obstructive pulmonary disease) (HCC)    Cramps, muscle, general    Elevated ferritin    Emphysema of lung (HCC)    GERD (gastroesophageal reflux disease)    History of hypercalcemia    Hyperlipidemia    Hypertension    Incomplete bladder emptying    Nocturia    Nonobstructive CAD    a. 06/2015 MV: mid ant/antseptal/apical anterior ischemia; b. 06/2015 Cath: LM nl, LAD Ca2+, LCX min irregs, OM1/2/3 nl, RCA mod Ca2+, RPDA/RPAV/RPL1,2,3 nl. EF 55-65%.   Peripheral polyneuropathy    Rising PSA level    Tobacco use    Urgency of micturation    Vitamin B 12 deficiency     Surgical History: Past Surgical History:  Procedure Laterality Date   CARDIAC CATHETERIZATION N/A 07/02/2015   Procedure: Left Heart Cath and Coronary Angiography;  Surgeon: Wellington Hampshire, MD;  Location: [facility];  Service: Cardiovascular;  Laterality: N/A;   COLONOSCOPY  2011   JWB   COLONOSCOPY WITH PROPOFOL N/A 11/26/2019   Procedure: COLONOSCOPY  WITH PROPOFOL;  Surgeon: Robert Bellow, MD;  Location: [facility] ENDOSCOPY;  Service: Endoscopy;  Laterality: N/A;   ESOPHAGOGASTRODUODENOSCOPY (EGD) WITH PROPOFOL N/A 11/14/2017   Procedure: ESOPHAGOGASTRODUODENOSCOPY (EGD) WITH PROPOFOL;  Surgeon: Robert Bellow, MD;  Location: [facility] ENDOSCOPY;  Service: Endoscopy;  Laterality: N/A;   throat biopsy     lump removed    Home Medications:  Allergies as of 02/09/2022   No Known Allergies      Medication List        Accurate as of February 09, 2022  9:34 AM. If you have any questions, ask your nurse or doctor.          Anoro Ellipta 62.5-25 MCG/ACT Aepb Generic drug: umeclidinium-vilanterol INHALE 1 PUFF BY MOUTH DAILY   aspirin 81 MG tablet Take 1 tablet by mouth daily.   atorvastatin 40 MG tablet Commonly known as: LIPITOR Take 1 tablet (40 mg total) by mouth daily.   B-12 1000 MCG Tbcr Take 1 tablet by mouth daily.   gabapentin 300  MG capsule Commonly known as: NEURONTIN Take 1 capsule (300 mg total) by mouth 2 (two) times daily.   MULTIPLE VITAMIN PO Take 1 tablet by mouth daily.   nitroGLYCERIN 0.4 MG SL tablet Commonly known as: NITROSTAT PLACE 1 TABLET UNDER THE TONGUE AS NEEDED FOR CHEST PAIN EVERY 5 MINTES   pantoprazole 40 MG tablet Commonly known as: PROTONIX Take 1 tablet (40 mg total) by mouth daily.   tamsulosin 0.4 MG Caps capsule Commonly known as: FLOMAX Take 1 capsule (0.4 mg total) by mouth daily.   varenicline 0.5 MG tablet Commonly known as: CHANTIX Take 1 tablet (0.5 mg total) by mouth 2 (two) times daily.   varenicline 1 MG tablet Commonly known as: Chantix Continuing Month Pak Take 1 tablet (1 mg total) by mouth 2 (two) times daily.   Vitamin D (Cholecalciferol) 25 MCG (1000 UT) Caps Take 1 tablet by mouth daily.        Allergies: No Known Allergies  Family History: Family History  Problem Relation Age of Onset   Hypertension Mother    CVA Mother    Cancer Father        Lung   Mental illness Brother        1-Schizophrenia   GER disease Son    Healthy Sister    Heart Problems Brother    Gallbladder disease Brother    Healthy Sister    Gallbladder disease Brother    Hypertension Brother     Social History:  reports that he has been smoking cigarettes. He started smoking about 48 years ago. He has a 66.00 pack-year smoking history. He has never used smokeless tobacco. He reports that he does not drink alcohol and does not use drugs.  ROS: For pertinent review of systems please refer to history of present illness  Physical Exam: BP (!) 148/59   Pulse (!) 52   Ht '5\' 11"'$  (1.803 m)   Wt 153 lb (69.4 kg)   BMI 21.34 kg/m   Constitutional:  Well nourished. Alert and oriented, No acute distress. HEENT: [place] AT, moist mucus membranes.  Trachea midline Cardiovascular: No clubbing, cyanosis, or edema. Respiratory: Normal respiratory effort, no increased work of  breathing. Neurologic: Grossly intact, no focal deficits, moving all 4 extremities. Psychiatric: Normal mood and affect.   Laboratory Data: Component     Latest Ref Rng 02/06/2022  Prostate Specific Ag, Serum     0.0 - 4.0 ng/mL 2.0  Latest Ref Rng & Units 07/27/2021    9:07 AM 08/17/2020    9:02 AM 08/13/2019    8:55 AM  CBC  WBC 3.8 - 10.8 Thousand/uL 5.9  7.9  6.8   Hemoglobin 13.2 - 17.1 g/dL 14.6  15.1  15.1   Hematocrit 38.5 - 50.0 % 43.1  45.5  46.2   Platelets 140 - 400 Thousand/uL 151  152  154        Latest Ref Rng & Units 07/27/2021    9:07 AM 08/17/2020    9:02 AM 02/17/2020    8:54 AM  CMP  Glucose 65 - 99 mg/dL 89  91  88   BUN 7 - 25 mg/dL '7  10  10   '$ Creatinine 0.70 - 1.28 mg/dL 1.56  1.52  1.54   Sodium 135 - 146 mmol/L 141  140  140   Potassium 3.5 - 5.3 mmol/L 4.1  4.2  3.9   Chloride 98 - 110 mmol/L 109  106  106   CO2 20 - 32 mmol/L '28  30  28   '$ Calcium 8.6 - 10.3 mg/dL 9.3  9.9  9.6   Total Protein 6.1 - 8.1 g/dL 6.6  7.1    Total Bilirubin 0.2 - 1.2 mg/dL 0.9  1.1    AST 10 - 35 U/L 21  21    ALT 9 - 46 U/L 15  14    I have reviewed the labs.   Pertinent Imaging: N/A   Assessment and Plan  1. BPH with LUTS -PSA stable -continue tamsulosin 0.4 mg  2. Prostate cancer screening -Discussed the AUA Guideline's (07/2021) for men aged 61+ years with PSA < 3 ng/mL may choose discontinue screening  -If he chooses to continue screening, the screening interval is every 2 to 4 years  -Since he is > 49 years of age and his PSA is < 4.0, we can discontinue screening at this time per NCCN guidelines -He wants to continue prostate cancer screening at this time    Return in about 1 year (around 02/10/2023) for IPSS, PSA and exam.  Zara Council, PA-C  [place], [place] [address] [phone]

## 2022-02-09 ENCOUNTER — Encounter: Payer: Self-pay | Admitting: Urology

## 2022-02-09 ENCOUNTER — Ambulatory Visit (INDEPENDENT_AMBULATORY_CARE_PROVIDER_SITE_OTHER): Payer: Medicare Other | Admitting: Urology

## 2022-02-09 VITALS — BP 148/59 | HR 52 | Ht 71.0 in | Wt 153.0 lb

## 2022-02-09 DIAGNOSIS — N138 Other obstructive and reflux uropathy: Secondary | ICD-10-CM | POA: Diagnosis not present

## 2022-02-09 DIAGNOSIS — N401 Enlarged prostate with lower urinary tract symptoms: Secondary | ICD-10-CM

## 2022-02-09 DIAGNOSIS — R350 Frequency of micturition: Secondary | ICD-10-CM | POA: Diagnosis not present

## 2022-02-09 DIAGNOSIS — Z125 Encounter for screening for malignant neoplasm of prostate: Secondary | ICD-10-CM | POA: Diagnosis not present

## 2022-02-09 DIAGNOSIS — I2510 Atherosclerotic heart disease of native coronary artery without angina pectoris: Secondary | ICD-10-CM

## 2022-02-09 MED ORDER — TAMSULOSIN HCL 0.4 MG PO CAPS: 0.4000 mg | ORAL_CAPSULE | Freq: Every day | ORAL | 3 refills | Status: DC

## 2022-02-09 NOTE — Addendum Note (Signed)
Addended by: Evelina Bucy on: 02/09/2022 09:36 AM   Modules accepted: Orders

## 2022-03-08 ENCOUNTER — Other Ambulatory Visit: Payer: Self-pay | Admitting: Family Medicine

## 2022-03-08 DIAGNOSIS — Z720 Tobacco use: Secondary | ICD-10-CM

## 2022-03-15 ENCOUNTER — Other Ambulatory Visit: Payer: Self-pay | Admitting: Family Medicine

## 2022-03-15 DIAGNOSIS — I700 Atherosclerosis of aorta: Secondary | ICD-10-CM

## 2022-03-15 DIAGNOSIS — I2089 Other forms of angina pectoris: Secondary | ICD-10-CM

## 2022-03-15 MED ORDER — NITROGLYCERIN 0.4 MG SL SUBL: SUBLINGUAL_TABLET | SUBLINGUAL | 1 refills | Status: DC

## 2022-03-15 NOTE — Telephone Encounter (Signed)
Ebony Hail calling from [employer]   Medication Refill - Medication: nitroGLYCERIN (NITROSTAT) 0.4 MG SL tablet   Has the patient contacted their pharmacy? Yes.   (Agent: If no, request that the patient contact the pharmacy for the refill. If patient does not wish to contact the pharmacy document the reason why and proceed with request.) (Agent: If yes, when and what did the pharmacy advise?)  Preferred Pharmacy (with phone number or street name):  [facility] (N[facility] [place] - [address]  [place] [address]  Phone: [phone] Fax: [phone]   Has the patient been seen for an appointment in the last year OR does the patient have an upcoming appointment? Yes.    Agent: Please be advised that RX refills may take up to 3 business days. We ask that you follow-up with your pharmacy.

## 2022-03-15 NOTE — Telephone Encounter (Signed)
Requested Prescriptions  Pending Prescriptions Disp Refills   nitroGLYCERIN (NITROSTAT) 0.4 MG SL tablet 25 tablet 1    Sig: PLACE 1 TABLET UNDER THE TONGUE AS NEEDED FOR CHEST PAIN EVERY 5 MINTES     Cardiovascular:  Nitrates Failed - 03/15/2022 11:30 AM      Failed - Last BP in normal range    BP Readings from Last 1 Encounters:  02/09/22 (!) 148/59         Passed - Last Heart Rate in normal range    Pulse Readings from Last 1 Encounters:  02/09/22 (!) 52         Passed - Valid encounter within last 12 months    Recent Outpatient Visits           1 month ago COPD, moderate ([place])   [facility] Steele Sizer, MD   4 months ago Encounter for [employer] annual wellness exam   [facility] Rory Percy M, DO   7 months ago COPD, moderate [facility])   [facility] Steele Sizer, MD   1 year ago COPD, moderate [facility])   [facility] Steele Sizer, MD   1 year ago COPD, moderate [facility])   [facility] Steele Sizer, MD       Future Appointments             In 2 months Fletcher Anon, Mertie Clause, MD [place]. [place]   In 4 months Ancil Boozer, Drue Stager, MD [facility], [place]   In 7 months Steele Sizer, MD [facility], [place]   In 11 months [place], [facility] [place]

## 2022-06-08 ENCOUNTER — Encounter: Payer: Self-pay | Admitting: Cardiovascular Disease

## 2022-06-08 ENCOUNTER — Ambulatory Visit: Payer: Medicare Other | Attending: Cardiovascular Disease | Admitting: Cardiovascular Disease

## 2022-06-08 VITALS — BP 140/64 | HR 52 | Ht 71.0 in | Wt 156.4 lb

## 2022-06-08 DIAGNOSIS — I2510 Atherosclerotic heart disease of native coronary artery without angina pectoris: Secondary | ICD-10-CM | POA: Diagnosis not present

## 2022-06-08 DIAGNOSIS — Z720 Tobacco use: Secondary | ICD-10-CM | POA: Diagnosis not present

## 2022-06-08 DIAGNOSIS — I10 Essential (primary) hypertension: Secondary | ICD-10-CM | POA: Insufficient documentation

## 2022-06-08 DIAGNOSIS — E785 Hyperlipidemia, unspecified: Secondary | ICD-10-CM | POA: Diagnosis not present

## 2022-06-08 NOTE — Progress Notes (Signed)
Cardiology Office Note   Date:  06/08/2022   ID:  Kevin Roberts, DOB 08/25/1945, MRN [id number]  PCP:  Steele Sizer, MD  Cardiologist:   [name], MD   Chief Complaint  Patient presents with   Other    6 month f/u no complaints today. Meds reviewed verbally with pt.      History of Present Illness: Kevin Roberts is a 77 y.o. male who is here today for a follow-up visit regarding mild to moderate calcified coronary artery disease. He has extensive risk factors including hypertension, hyperlipidemia, COPD and prolonged history of tobacco use.  Cardiac catheterization in 2017 showed mild to moderate nonobstructive coronary artery disease with overall moderately to heavily calcified arteries especially the LAD. Ejection fraction and LVEDP were both normal. He had an abdominal aortic ultrasound in 2016 which showed no evidence of aortic aneurysm.  He has known history of chronic atypical chest pain.  He was seen last year in August and was noted to have sinus bradycardia but overall was asymptomatic.  He has been doing well with no chest pain or worsening dyspnea.  He does have underlying COPD and is trying to quit smoking with Chantix.   Past Medical History:  Diagnosis Date   Benign prostatic hypertrophy    Chronic kidney disease, stage III (moderate) (HCC)    COPD (chronic obstructive pulmonary disease) (HCC)    Cramps, muscle, general    Elevated ferritin    Emphysema of lung (HCC)    GERD (gastroesophageal reflux disease)    History of hypercalcemia    Hyperlipidemia    Hypertension    Incomplete bladder emptying    Nocturia    Nonobstructive CAD    a. 06/2015 MV: mid ant/antseptal/apical anterior ischemia; b. 06/2015 Cath: LM nl, LAD Ca2+, LCX min irregs, OM1/2/3 nl, RCA mod Ca2+, RPDA/RPAV/RPL1,2,3 nl. EF 55-65%.   Peripheral polyneuropathy    Rising PSA level    Tobacco use    Urgency of micturation    Vitamin B 12 deficiency     Past Surgical History:   Procedure Laterality Date   CARDIAC CATHETERIZATION N/A 07/02/2015   Procedure: Left Heart Cath and Coronary Angiography;  Surgeon: Wellington Hampshire, MD;  Location: [facility];  Service: Cardiovascular;  Laterality: N/A;   COLONOSCOPY  2011   JWB   COLONOSCOPY WITH PROPOFOL N/A 11/26/2019   Procedure: COLONOSCOPY WITH PROPOFOL;  Surgeon: Robert Bellow, MD;  Location: [facility] ENDOSCOPY;  Service: Endoscopy;  Laterality: N/A;   ESOPHAGOGASTRODUODENOSCOPY (EGD) WITH PROPOFOL N/A 11/14/2017   Procedure: ESOPHAGOGASTRODUODENOSCOPY (EGD) WITH PROPOFOL;  Surgeon: Robert Bellow, MD;  Location: [facility] ENDOSCOPY;  Service: Endoscopy;  Laterality: N/A;   throat biopsy     lump removed     Current Outpatient Medications  Medication Sig Dispense Refill   aspirin 81 MG tablet Take 1 tablet by mouth daily.     atorvastatin (LIPITOR) 40 MG tablet Take 1 tablet (40 mg total) by mouth daily. 90 tablet 1   Cyanocobalamin (B-12) 1000 MCG TBCR Take 1 tablet by mouth daily.     gabapentin (NEURONTIN) 300 MG capsule Take 1 capsule (300 mg total) by mouth 2 (two) times daily. 180 capsule 3   MULTIPLE VITAMIN PO Take 1 tablet by mouth daily.     nitroGLYCERIN (NITROSTAT) 0.4 MG SL tablet PLACE 1 TABLET UNDER THE TONGUE AS NEEDED FOR CHEST PAIN EVERY 5 MINTES 25 tablet 1   pantoprazole (PROTONIX) 40 MG  tablet Take 1 tablet (40 mg total) by mouth daily. 90 tablet 1   tamsulosin (FLOMAX) 0.4 MG CAPS capsule Take 1 capsule (0.4 mg total) by mouth daily. 90 capsule 3   umeclidinium-vilanterol (ANORO ELLIPTA) 62.5-25 MCG/ACT AEPB INHALE 1 PUFF BY MOUTH DAILY 60 each 5   varenicline (CHANTIX CONTINUING MONTH PAK) 1 MG tablet Take 1 tablet (1 mg total) by mouth 2 (two) times daily. 60 tablet 4   varenicline (CHANTIX) 0.5 MG tablet Take 1 tablet (0.5 mg total) by mouth 2 (two) times daily. 60 tablet 0   Vitamin D, Cholecalciferol, 1000 UNITS CAPS Take 1 tablet by mouth daily.     No current  facility-administered medications for this visit.    Allergies:   Patient has no known allergies.    Social History:  The patient  reports that he has been smoking cigarettes. He started smoking about 48 years ago. He has a 11.00 pack-year smoking history. He has never used smokeless tobacco. He reports that he does not drink alcohol and does not use drugs.   Family History:  The patient's family history includes CVA in his mother; Cancer in his father; GER disease in his son; Gallbladder disease in his brother and brother; Healthy in his sister and sister; Heart Problems in his brother; Hypertension in his brother and mother; Mental illness in his brother.    ROS:  Please see the history of present illness.   Otherwise, review of systems are positive for none.   All other systems are reviewed and negative.    PHYSICAL EXAM: VS:  BP (!) 140/64 (BP Location: Left Arm, Patient Position: Sitting, Cuff Size: Normal)   Pulse (!) 52   Ht '5\' 11"'$  (1.803 m)   Wt 156 lb 6 oz (70.9 kg)   SpO2 97%   BMI 21.81 kg/m  , BMI Body mass index is 21.81 kg/m. GEN: Well nourished, well developed, in no acute distress  HEENT: normal  Neck: no JVD, carotid bruits, or masses Cardiac: RRR; no murmurs, rubs, or gallops,no edema  Respiratory:  clear to auscultation bilaterally, normal work of breathing GI: soft, nontender, nondistended, + BS MS: no deformity or atrophy  Skin: warm and dry, no rash Neuro:  Strength and sensation are intact Psych: euthymic mood, full affect   EKG:  EKG is  ordered today. EKG showed sinus bradycardia with sinus arrhythmia and first-degree AV block.   Recent Labs: 07/27/2021: ALT 15; BUN 7; Creat 1.56; Hemoglobin 14.6; Platelets 151; Potassium 4.1; Sodium 141    Lipid Panel    Component Value Date/Time   CHOL 88 07/27/2021 0907   CHOL 92 (L) 12/03/2014 1216   TRIG 76 07/27/2021 0907   HDL 49 07/27/2021 0907   HDL 49 12/03/2014 1216   CHOLHDL 1.8 07/27/2021 0907    VLDL 11 09/20/2016 0810   LDLCALC 23 07/27/2021 0907      Wt Readings from Last 3 Encounters:  06/08/22 156 lb 6 oz (70.9 kg)  02/09/22 153 lb (69.4 kg)  01/26/22 154 lb (69.9 kg)         ASSESSMENT AND PLAN:  1.  Coronary artery disease involving native coronary artery without angina: Continue medical therapy and low-dose aspirin. Previous atypical chest pain thought to be due to GERD and currently is well-controlled with a PPI.  2. Hyperlipidemia: I reviewed most recent lipid profile which showed an LDL of 23.  Continue atorvastatin.  3. Tobacco use: He is trying to quit smoking with  Chantix and is down to 3 to 6 cigarettes a day.  4.  Essential hypertension: He used to be on Toprol which was stopped due to bradycardia.  Currently not requiring antihypertensive medications but blood pressure is on the high side.  If blood pressure continues to be elevated, consider adding amlodipine.  5.  Chronic kidney disease: This has been stable with most recent creatinine of 1.56.   Disposition:   FU with me in one year   Signed,  [name], MD  06/08/2022 9:33 AM    [place]

## 2022-06-08 NOTE — Patient Instructions (Signed)
Medication Instructions:  No changes *If you need a refill on your cardiac medications before your next appointment, please call your pharmacy*   Lab Work: None ordered If you have labs (blood work) drawn today and your tests are completely normal, you will receive your results only by: Luis Lopez (if you have MyChart) OR A paper copy in the mail If you have any lab test that is abnormal or we need to change your treatment, we will call you to review the results.   Testing/Procedures: None ordered   Follow-Up: At [facility], you and your health needs are our priority.  As part of our continuing mission to provide you with exceptional heart care, we have created designated Provider Care Teams.  These Care Teams include your primary Cardiologist (physician) and Advanced Practice Providers (APPs -  Physician Assistants and Nurse Practitioners) who all work together to provide you with the care you need, when you need it.  We recommend signing up for the patient portal called "MyChart".  Sign up information is provided on this After Visit Summary.  MyChart is used to connect with patients for Virtual Visits (Telemedicine).  Patients are able to view lab/test results, encounter notes, upcoming appointments, etc.  Non-urgent messages can be sent to your provider as well.   To learn more about what you can do with MyChart, go to [email or web address].    Your next appointment:   12 month(s)  Provider:   You may see [name], MD or one of the following Advanced Practice Providers on your designated Care Team:   Murray Hodgkins, NP Christell Faith, PA-C Cadence Kathlen Mody, PA-C Gerrie Nordmann, NP

## 2022-06-21 ENCOUNTER — Other Ambulatory Visit: Payer: Self-pay | Admitting: Family Medicine

## 2022-06-21 DIAGNOSIS — E785 Hyperlipidemia, unspecified: Secondary | ICD-10-CM

## 2022-07-31 NOTE — Progress Notes (Unsigned)
Name: Kevin Roberts   MRN: [id number]    DOB: [birth year]   Date:08/01/2022       Progress Note  Subjective  Chief Complaint  Follow Up  HPI  COPD Moderate/Emphysema : he is now taking Anoro daily - he has a better insurance and the coverage is better, he has mild cough no wheezing, he is not very active but has SOB with moderate He is now taking Chantix and smoking only 3 cigarettes daily. Marland Kitchen He still usually smoking 1.5 pack daily Last CT lung was done 08/2019, no nodules, centrilobular and paraseptal emphysema, he had bronchictasy on CT was in 2022 but on last CT only showed emphysema.   History of Alcoholism: he used to drink before retirement - about 2 shots at night, however when he retired at age 41 he started to drink about 1/5 of liquor every two days. He states he stopped drinking because he fell on his face, developed a gastric ulcer and B12 deficiency. He is doing well since he stopped drinking around 2010. He still has tingling on his legs He takes gabapentin and symptoms are stable, no longer has pain just tingling He is able to button his shirts    GERD and history of gastric ulcer:/hiatal hernia   He states symptoms controlled as long as he takes a PPI, no heartburn or regurgitation. He was seen by Dr. Birdie Sons and had EGD in August 2019. He states no recent flares   Hyperlipidemia: taking Atorvastatin and denies side effects. Taking aspirin daily . Denies myalgia. LDL has been at goal   CAD/Angina : found on CT chest 3 vessel disease he has intermittent chest pain and also has SOB that may may be multifactorial from COPD also. On aspirin and statin therapy, he is off beta blocker due to bradycardia, bp is controlled off medication. He continues to have chest pain that is intermittent a few times a month and usually secondary to stress.. Cardiac cath in 2017 showed mild to moderate non-obstructive disease and is only on medical management. Unable to tolerate Imdur - it cause  hypotension    B12 neuropathy: he is taking B12 supplementation and also takes Neurontin , stable    Atherosclerosis abdominal aorta: on medical management, aspirin,  and statin therapy. He is still smoking  but ready to quit   CKI: stage III, he avoiding NSAID's, no pruritis, good urine output , GFR has been stable , we will recheck it today   Renal Cyst: picked up on CT lung and had follow up renal US 2020  benign cyst . Sees Urologist yearly   Left gynecomastia: we did blood work i that was normal.  No pain or discomfort , CT did not show anything Unchanged   BPH: under the care of Urologist , IPSS score was 7 recently, PSA  normalized Fall 2023  Patient Active Problem List   Diagnosis Date Noted   Centrilobular emphysema 01/26/2022   Bronchiectasis without complication 02/17/2020   History of gastric ulcer 02/17/2020   Hiatal hernia 01/22/2018   Abdominal pain, chronic, right lower quadrant 10/26/2017   Esophageal diverticulum 10/26/2017   History of alcoholism 01/18/2016   Gynecomastia 10/14/2015   Abnormal nuclear stress test    Anginal equivalent    Atherosclerosis of aorta 04/15/2015   BPH with obstruction/lower urinary tract symptoms 02/03/2015   Renal cyst, left 10/13/2014   Tobacco use    B12 neuropathy 10/04/2014   CAD, multiple vessel 10/04/2014   Benign essential  HTN 10/04/2014   Chronic kidney disease (CKD), stage III (moderate) 10/04/2014   Dyslipidemia 10/04/2014   COPD, moderate 10/04/2014   Gastro-esophageal reflux disease without esophagitis 10/04/2014   Enlarged prostate 10/04/2014   Vitamin D deficiency 10/04/2014    Past Surgical History:  Procedure Laterality Date   CARDIAC CATHETERIZATION N/A 07/02/2015   Procedure: Left Heart Cath and Coronary Angiography;  Surgeon: Iran Ouch, MD;  Location: [facility];  Service: Cardiovascular;  Laterality: N/A;   COLONOSCOPY  2011   [facility]   COLONOSCOPY WITH PROPOFOL N/A 11/26/2019   Procedure:  COLONOSCOPY WITH PROPOFOL;  Surgeon: Earline Mayotte, MD;  Location: [facility];  Service: Endoscopy;  Laterality: N/A;   ESOPHAGOGASTRODUODENOSCOPY (EGD) WITH PROPOFOL N/A 11/14/2017   Procedure: ESOPHAGOGASTRODUODENOSCOPY (EGD) WITH PROPOFOL;  Surgeon: Earline Mayotte, MD;  Location: [facility];  Service: Endoscopy;  Laterality: N/A;   throat biopsy     lump removed    Family History  Problem Relation Age of Onset   Hypertension Mother    CVA Mother    Cancer Father        Lung   Mental illness Brother        1-Schizophrenia   GER disease Son    Healthy Sister    Heart Problems Brother    Gallbladder disease Brother    Healthy Sister    Gallbladder disease Brother    Hypertension Brother     Social History   Tobacco Use   Smoking status: Every Day    Packs/day: 0.25    Years: 44.00    Additional pack years: 0.00    Total pack years: 11.00    Types: Cigarettes    Start date: 10/04/1973   Smokeless tobacco: Never   Tobacco comments:     Smoking Cessation Information given   Substance Use Topics   Alcohol use: No    Alcohol/week: 0.0 standard drinks of alcohol     Current Outpatient Medications:    aspirin 81 MG tablet, Take 1 tablet by mouth daily., Disp: , Rfl:    atorvastatin (LIPITOR) 40 MG tablet, Take 1 tablet by mouth once daily, Disp: 90 tablet, Rfl: 0   Cyanocobalamin (B-12) 1000 MCG TBCR, Take 1 tablet by mouth daily., Disp: , Rfl:    gabapentin (NEURONTIN) 300 MG capsule, Take 1 capsule (300 mg total) by mouth 2 (two) times daily., Disp: 180 capsule, Rfl: 3   MULTIPLE VITAMIN PO, Take 1 tablet by mouth daily., Disp: , Rfl:    nitroGLYCERIN (NITROSTAT) 0.4 MG SL tablet, PLACE 1 TABLET UNDER THE TONGUE AS NEEDED FOR CHEST PAIN EVERY 5 MINTES, Disp: 25 tablet, Rfl: 1   pantoprazole (PROTONIX) 40 MG tablet, Take 1 tablet (40 mg total) by mouth daily., Disp: 90 tablet, Rfl: 1   tamsulosin (FLOMAX) 0.4 MG CAPS capsule, Take 1 capsule (0.4  mg total) by mouth daily., Disp: 90 capsule, Rfl: 3   umeclidinium-vilanterol (ANORO ELLIPTA) 62.5-25 MCG/ACT AEPB, INHALE 1 PUFF BY MOUTH DAILY, Disp: 60 each, Rfl: 5   varenicline (CHANTIX CONTINUING MONTH PAK) 1 MG tablet, Take 1 tablet (1 mg total) by mouth 2 (two) times daily., Disp: 60 tablet, Rfl: 4   varenicline (CHANTIX) 0.5 MG tablet, Take 1 tablet (0.5 mg total) by mouth 2 (two) times daily., Disp: 60 tablet, Rfl: 0   Vitamin D, Cholecalciferol, 1000 UNITS CAPS, Take 1 tablet by mouth daily., Disp: , Rfl:   No Known Allergies  I personally reviewed  active problem list, medication list, allergies, family history, social history, health maintenance with the patient/caregiver today.   ROS  Ten systems reviewed and is negative except as mentioned in HPI   Objective  Vitals:   08/01/22 0852  BP: 132/68  Pulse: 79  Resp: 16  SpO2: 95%  Weight: 157 lb (71.2 kg)  Height: 5\' 11"  (1.803 m)    Body mass index is 21.9 kg/m.  Physical Exam  Constitutional: Patient appears well-developed and well-nourished.  No distress.  HEENT: head atraumatic, normocephalic, pupils equal and reactive to light, neck supple Cardiovascular: Normal rate, regular rhythm and normal heart sounds.  No murmur heard. No BLE edema. Pulmonary/Chest: Effort normal and breath sounds normal. No respiratory distress. Abdominal: Soft.  There is no tenderness. Psychiatric: Patient has a normal mood and affect. behavior is normal. Judgment and thought content normal.    PHQ2/9:    08/01/2022    8:51 AM 01/26/2022    8:48 AM 10/18/2021    8:27 AM 07/27/2021    8:26 AM 02/17/2021    7:53 AM  Depression screen PHQ 2/9  Decreased Interest 0 0 0 0 0  Down, Depressed, Hopeless 0 0 0 0 0  PHQ - 2 Score 0 0 0 0 0  Altered sleeping 0 0 0 0 0  Tired, decreased energy 0 1 0 0 0  Change in appetite 0 0 0 0 0  Feeling bad or failure about yourself  0 0 0 0 0  Trouble concentrating 0 0 0 0 0  Moving slowly or  fidgety/restless 0 0 0 0 0  Suicidal thoughts 0 0 0 0 0  PHQ-9 Score 0 1 0 0 0    phq 9 is negative   Fall Risk:    08/01/2022    8:51 AM 01/26/2022    8:48 AM 10/18/2021    8:27 AM 07/27/2021    8:26 AM 02/17/2021    7:52 AM  Fall Risk   Falls in the past year? 0 0 0 0 0  Number falls in past yr: 0 0 0 0 0  Injury with Fall? 0 0 0 0 0  Risk for fall due to : No Fall Risks No Fall Risks No Fall Risks No Fall Risks No Fall Risks  Follow up Falls prevention discussed Falls prevention discussed Falls prevention discussed Falls prevention discussed Falls prevention discussed      Functional Status Survey: Is the patient deaf or have difficulty hearing?: No Does the patient have difficulty seeing, even when wearing glasses/contacts?: No Does the patient have difficulty concentrating, remembering, or making decisions?: No Does the patient have difficulty walking or climbing stairs?: No Does the patient have difficulty dressing or bathing?: No Does the patient have difficulty doing errands alone such as visiting a doctor's office or shopping?: No    Assessment & Plan    1. Stage 3a chronic kidney disease  - CBC with Differential/Platelet - COMPLETE METABOLIC PANEL WITH GFR - VITAMIN D 25 Hydroxy (Vit-D Deficiency, Fractures)  2. B12 neuropathy  - B12 and Folate Panel - gabapentin (NEURONTIN) 300 MG capsule; Take 1 capsule (300 mg total) by mouth 2 (two) times daily.  Dispense: 180 capsule; Refill: 3  3. Atherosclerosis of aorta  - Lipid panel - atorvastatin (LIPITOR) 40 MG tablet; Take 1 tablet (40 mg total) by mouth daily.  Dispense: 90 tablet; Refill: 1  4. Centrilobular emphysema  - varenicline (CHANTIX CONTINUING MONTH PAK) 1 MG tablet; Take 1 tablet (  1 mg total) by mouth 2 (two) times daily.  Dispense: 60 tablet; Refill: 4 - umeclidinium-vilanterol (ANORO ELLIPTA) 62.5-25 MCG/ACT AEPB; INHALE 1 PUFF BY MOUTH DAILY  Dispense: 60 each; Refill: 5  5. History of  alcoholism  Quit years ago   6. CAD, multiple vessel  - Lipid panel - atorvastatin (LIPITOR) 40 MG tablet; Take 1 tablet (40 mg total) by mouth daily.  Dispense: 90 tablet; Refill: 1  7. History of gastric ulcer  - pantoprazole (PROTONIX) 40 MG tablet; Take 1 tablet (40 mg total) by mouth daily.  Dispense: 90 tablet; Refill: 1  8. GERD without esophagitis  - pantoprazole (PROTONIX) 40 MG tablet; Take 1 tablet (40 mg total) by mouth daily.  Dispense: 90 tablet; Refill: 1  9. Tobacco use  - varenicline (CHANTIX CONTINUING MONTH PAK) 1 MG tablet; Take 1 tablet (1 mg total) by mouth 2 (two) times daily.  Dispense: 60 tablet; Refill: 4  10. Dyslipidemia

## 2022-08-01 ENCOUNTER — Ambulatory Visit (INDEPENDENT_AMBULATORY_CARE_PROVIDER_SITE_OTHER): Payer: Medicare Other | Admitting: Family Medicine

## 2022-08-01 ENCOUNTER — Encounter: Payer: Self-pay | Admitting: Family Medicine

## 2022-08-01 VITALS — BP 132/68 | HR 79 | Resp 16 | Ht 71.0 in | Wt 157.0 lb

## 2022-08-01 DIAGNOSIS — I2510 Atherosclerotic heart disease of native coronary artery without angina pectoris: Secondary | ICD-10-CM | POA: Diagnosis not present

## 2022-08-01 DIAGNOSIS — I700 Atherosclerosis of aorta: Secondary | ICD-10-CM

## 2022-08-01 DIAGNOSIS — E785 Hyperlipidemia, unspecified: Secondary | ICD-10-CM

## 2022-08-01 DIAGNOSIS — E538 Deficiency of other specified B group vitamins: Secondary | ICD-10-CM | POA: Diagnosis not present

## 2022-08-01 DIAGNOSIS — F1021 Alcohol dependence, in remission: Secondary | ICD-10-CM

## 2022-08-01 DIAGNOSIS — N1831 Chronic kidney disease, stage 3a: Secondary | ICD-10-CM

## 2022-08-01 DIAGNOSIS — G63 Polyneuropathy in diseases classified elsewhere: Secondary | ICD-10-CM

## 2022-08-01 DIAGNOSIS — Z720 Tobacco use: Secondary | ICD-10-CM

## 2022-08-01 DIAGNOSIS — Z8711 Personal history of peptic ulcer disease: Secondary | ICD-10-CM

## 2022-08-01 DIAGNOSIS — K219 Gastro-esophageal reflux disease without esophagitis: Secondary | ICD-10-CM

## 2022-08-01 DIAGNOSIS — J432 Centrilobular emphysema: Secondary | ICD-10-CM

## 2022-08-01 MED ORDER — VARENICLINE TARTRATE 1 MG PO TABS: 1.0000 mg | ORAL_TABLET | Freq: Two times a day (BID) | ORAL | 4 refills | Status: DC

## 2022-08-01 MED ORDER — ANORO ELLIPTA 62.5-25 MCG/ACT IN AEPB
INHALATION_SPRAY | RESPIRATORY_TRACT | 5 refills | Status: DC
Start: 2022-08-01 — End: 2023-04-09

## 2022-08-01 MED ORDER — ATORVASTATIN CALCIUM 40 MG PO TABS: 40.0000 mg | ORAL_TABLET | Freq: Every day | ORAL | 1 refills | Status: DC

## 2022-08-01 MED ORDER — GABAPENTIN 300 MG PO CAPS: 300.0000 mg | ORAL_CAPSULE | Freq: Two times a day (BID) | ORAL | 3 refills | Status: DC

## 2022-08-01 MED ORDER — PANTOPRAZOLE SODIUM 40 MG PO TBEC: 40.0000 mg | DELAYED_RELEASE_TABLET | Freq: Every day | ORAL | 1 refills | Status: DC

## 2022-08-02 LAB — B12 AND FOLATE PANEL
Folate: >24 ng/mL
Vitamin B-12: 802 pg/mL (ref 200–1100)

## 2022-08-02 LAB — COMPLETE METABOLIC PANEL WITHOUT GFR
AG Ratio: 1.7 (calc) (ref 1.0–2.5)
ALT: 16 U/L (ref 9–46)
AST: 22 U/L (ref 10–35)
Albumin: 4.2 g/dL (ref 3.6–5.1)
Alkaline phosphatase (APISO): 53 U/L (ref 35–144)
BUN/Creatinine Ratio: 8 (calc) (ref 6–22)
BUN: 11 mg/dL (ref 7–25)
CO2: 29 mmol/L (ref 20–32)
Calcium: 9.9 mg/dL (ref 8.6–10.3)
Chloride: 103 mmol/L (ref 98–110)
Creat: 1.42 mg/dL — ABNORMAL HIGH (ref 0.70–1.28)
Globulin: 2.5 g/dL (ref 1.9–3.7)
Glucose, Bld: 79 mg/dL (ref 65–99)
Potassium: 4.3 mmol/L (ref 3.5–5.3)
Sodium: 139 mmol/L (ref 135–146)
Total Bilirubin: 1 mg/dL (ref 0.2–1.2)
Total Protein: 6.7 g/dL (ref 6.1–8.1)
eGFR: 52 mL/min/1.73m2 — ABNORMAL LOW

## 2022-08-02 LAB — CBC WITH DIFFERENTIAL/PLATELET
Absolute Monocytes: 684 {cells}/uL (ref 200–950)
Basophils Absolute: 38 {cells}/uL (ref 0–200)
Basophils Relative: 0.5 %
Eosinophils Absolute: 152 {cells}/uL (ref 15–500)
Eosinophils Relative: 2 %
HCT: 43.3 % (ref 38.5–50.0)
Hemoglobin: 14.6 g/dL (ref 13.2–17.1)
Lymphs Abs: 1649 {cells}/uL (ref 850–3900)
MCH: 30.5 pg (ref 27.0–33.0)
MCHC: 33.7 g/dL (ref 32.0–36.0)
MCV: 90.4 fL (ref 80.0–100.0)
MPV: 12.5 fL (ref 7.5–12.5)
Monocytes Relative: 9 %
Neutro Abs: 5077 {cells}/uL (ref 1500–7800)
Neutrophils Relative %: 66.8 %
Platelets: 186 Thousand/uL (ref 140–400)
RBC: 4.79 Million/uL (ref 4.20–5.80)
RDW: 11.8 % (ref 11.0–15.0)
Total Lymphocyte: 21.7 %
WBC: 7.6 Thousand/uL (ref 3.8–10.8)

## 2022-08-02 LAB — LIPID PANEL
Cholesterol: 105 mg/dL
HDL: 59 mg/dL
LDL Cholesterol (Calc): 29 mg/dL
Non-HDL Cholesterol (Calc): 46 mg/dL
Total CHOL/HDL Ratio: 1.8 (calc)
Triglycerides: 86 mg/dL

## 2022-08-02 LAB — VITAMIN D 25 HYDROXY (VIT D DEFICIENCY, FRACTURES): Vit D, 25-Hydroxy: 58 ng/mL (ref 30–100)

## 2022-09-25 ENCOUNTER — Other Ambulatory Visit: Payer: Self-pay | Admitting: Family Medicine

## 2022-09-25 DIAGNOSIS — I700 Atherosclerosis of aorta: Secondary | ICD-10-CM

## 2022-09-25 DIAGNOSIS — I2510 Atherosclerotic heart disease of native coronary artery without angina pectoris: Secondary | ICD-10-CM

## 2022-10-19 ENCOUNTER — Ambulatory Visit: Admission: RE | Admit: 2022-10-19 | Payer: Medicare Other | Source: Ambulatory Visit

## 2022-10-23 NOTE — Progress Notes (Unsigned)
Patient: Kevin Roberts, Male    DOB: 10/11/1945, 77 y.o.   MRN: [id number]  Visit Date: 10/24/2022  Today's Provider: Ruel Favors, MD   AWV  Subjective:   Kevin Roberts is a 77 y.o. male who presents today for his Subsequent Annual Wellness Visit.  Patient/Caregiver input:    HPI  IPSS Questionnaire (AUA-7): Over the past month.   1)  How often have you had a sensation of not emptying your bladder completely after you finish urinating?  1 - Less than 1 time in 5  2)  How often have you had to urinate again less than two hours after you finished urinating? 0 - Not at all  3)  How often have you found you stopped and started again several times when you urinated?  0 - Not at all  4) How difficult have you found it to postpone urination?  0 - Not at all  5) How often have you had a weak urinary stream?  0 - Not at all  6) How often have you had to push or strain to begin urination?  1 - Less than 1 time in 5  7) How many times did you most typically get up to urinate from the time you went to bed until the time you got up in the morning?  0 - None  Total score:  0-7 mildly symptomatic   8-19 moderately symptomatic   20-35 severely symptomatic     Review of Systems  Constitutional: Negative for fever , positive for mild weight change.  Respiratory: Negative for cough and shortness of breath.   Cardiovascular: Negative for chest pain or palpitations.  Gastrointestinal: Negative for abdominal pain, no bowel changes.  Musculoskeletal: Negative for gait problem or joint swelling.  Skin: Negative for rash.  Neurological: Negative for dizziness or headache.  No other specific complaints in a complete review of systems (except as listed in HPI above).  Past Medical History:  Diagnosis Date   Benign prostatic hypertrophy    Chronic kidney disease, stage III (moderate) (HCC)    COPD (chronic obstructive pulmonary disease) (HCC)    Cramps, muscle, general    Elevated ferritin     Emphysema of lung (HCC)    GERD (gastroesophageal reflux disease)    History of hypercalcemia    Hyperlipidemia    Hypertension    Incomplete bladder emptying    Nocturia    Nonobstructive CAD    a. 06/2015 MV: mid ant/antseptal/apical anterior ischemia; b. 06/2015 Cath: LM nl, LAD Ca2+, LCX min irregs, OM1/2/3 nl, RCA mod Ca2+, RPDA/RPAV/RPL1,2,3 nl. EF 55-65%.   Peripheral polyneuropathy    Rising PSA level    Tobacco use    Urgency of micturation    Vitamin B 12 deficiency     Past Surgical History:  Procedure Laterality Date   CARDIAC CATHETERIZATION N/A 07/02/2015   Procedure: Left Heart Cath and Coronary Angiography;  Surgeon: Iran Ouch, MD;  Location: [facility];  Service: Cardiovascular;  Laterality: N/A;   COLONOSCOPY  2011   [facility]   COLONOSCOPY WITH PROPOFOL N/A 11/26/2019   Procedure: COLONOSCOPY WITH PROPOFOL;  Surgeon: Earline Mayotte, MD;  Location: [facility];  Service: Endoscopy;  Laterality: N/A;   ESOPHAGOGASTRODUODENOSCOPY (EGD) WITH PROPOFOL N/A 11/14/2017   Procedure: ESOPHAGOGASTRODUODENOSCOPY (EGD) WITH PROPOFOL;  Surgeon: Earline Mayotte, MD;  Location: [facility] ENDOSCOPY;  Service: Endoscopy;  Laterality: N/A;   throat biopsy     lump removed  Family History  Problem Relation Age of Onset   Hypertension Mother    CVA Mother    Cancer Father        Lung   Mental illness Brother        1-Schizophrenia   GER disease Son    Healthy Sister    Heart Problems Brother    Gallbladder disease Brother    Healthy Sister    Gallbladder disease Brother    Hypertension Brother     Social History   Socioeconomic History   Marital status: Married    Spouse name: Claudine   Number of children: 3   Years of education: some college   Highest education level: 12th grade  Occupational History   Occupation: Retired  Tobacco Use   Smoking status: Every Day    Current packs/day: 0.25    Average packs/day: 0.3 packs/day for 49.1 years (12.3  ttl pk-yrs)    Types: Cigarettes    Start date: 10/04/1973   Smokeless tobacco: Never   Tobacco comments:    He is down to 3 cigarettes per day usually 6 am, 12 pm and 9 pm He is trying to quit     He is taking Chantix since Fall 2023  Vaping Use   Vaping status: Never Used  Substance and Sexual Activity   Alcohol use: No    Alcohol/week: 0.0 standard drinks of alcohol   Drug use: No   Sexual activity: Not Currently    Partners: Female  Other Topics Concern   Not on file  Social History Narrative   Not on file   Social Determinants of Health   Financial Resource Strain: Low Risk  (10/24/2022)   Overall Financial Resource Strain (CARDIA)    Difficulty of Paying Living Expenses: Not hard at all  Food Insecurity: No Food Insecurity (10/24/2022)   Hunger Vital Sign    Worried About Running Out of Food in the Last Year: Never true    Ran Out of Food in the Last Year: Never true  Transportation Needs: No Transportation Needs (10/24/2022)   PRAPARE - Administrator, Civil Service (Medical): No    Lack of Transportation (Non-Medical): No  Physical Activity: Inactive (10/24/2022)   Exercise Vital Sign    Days of Exercise per Week: 0 days    Minutes of Exercise per Session: 0 min  Stress: No Stress Concern Present (10/24/2022)   [employer] of Occupational Health - Occupational Stress Questionnaire    Feeling of Stress : Not at all  Social Connections: Moderately Isolated (10/24/2022)   Social Connection and Isolation Panel [NHANES]    Frequency of Communication with Friends and Family: Three times a week    Frequency of Social Gatherings with Friends and Family: Twice a week    Attends Religious Services: Never    Database administrator or Organizations: No    Attends Banker Meetings: Never    Marital Status: Married  Catering manager Violence: Not At Risk (10/24/2022)   Humiliation, Afraid, Rape, and Kick questionnaire    Fear of Current or Ex-Partner:  No    Emotionally Abused: No    Physically Abused: No    Sexually Abused: No    Outpatient Encounter Medications as of 10/24/2022  Medication Sig   aspirin 81 MG tablet Take 1 tablet by mouth daily.   atorvastatin (LIPITOR) 40 MG tablet Take 1 tablet by mouth once daily   Cyanocobalamin (B-12) 1000 MCG TBCR Take 1  tablet by mouth daily.   gabapentin (NEURONTIN) 300 MG capsule Take 1 capsule (300 mg total) by mouth 2 (two) times daily.   MULTIPLE VITAMIN PO Take 1 tablet by mouth daily.   nitroGLYCERIN (NITROSTAT) 0.4 MG SL tablet PLACE 1 TABLET UNDER THE TONGUE AS NEEDED FOR CHEST PAIN EVERY 5 MINTES   pantoprazole (PROTONIX) 40 MG tablet Take 1 tablet (40 mg total) by mouth daily.   tamsulosin (FLOMAX) 0.4 MG CAPS capsule Take 1 capsule (0.4 mg total) by mouth daily.   umeclidinium-vilanterol (ANORO ELLIPTA) 62.5-25 MCG/ACT AEPB INHALE 1 PUFF BY MOUTH DAILY   varenicline (CHANTIX CONTINUING MONTH PAK) 1 MG tablet Take 1 tablet (1 mg total) by mouth 2 (two) times daily.   Vitamin D, Cholecalciferol, 1000 UNITS CAPS Take 1 tablet by mouth daily.   No facility-administered encounter medications on file as of 10/24/2022.    No Known Allergies  Care Team Updated in EHR: Yes  Last Vision Exam: Oct 2023 Wears corrective lenses: Yes Last Dental Exam: dentures - he checks his gum  Last Hearing Exam: Unsure Wears Hearing Aids: No  Functional Ability / Safety Screening 1.  Was the timed Get Up and Go test longer than 30 seconds?  yes 2.  Does the patient need help with the phone, transportation, shopping,      preparing meals, housework, laundry, medications, or managing money?  no 3.  Does the patient's home have:  loose throw rugs in the hallway?   No       Grab bars in the bathroom? no      Handrails on the stairs? Stairs with rails to go outside       Poor lighting?   no 4.  Has the patient noticed any hearing difficulties?   yes  Diet Recall and Exercise Regimen: eats mostly at home  , eats a lot of grilled chicken , he has been eating a lot of cookies and candy daily - when he cut down on smoking, he is down to one cigarette daily - discussed switching from cookies and sodas to a dumb- dumb lollipop   Fall Risk:  See screening under Objective Information  Depression Screen:     10/24/2022    8:24 AM 08/01/2022    8:51 AM 01/26/2022    8:48 AM 10/18/2021    8:27 AM 07/27/2021    8:26 AM  Depression screen PHQ 2/9  Decreased Interest 0 0 0 0 0  Down, Depressed, Hopeless 0 0 0 0 0  PHQ - 2 Score 0 0 0 0 0  Altered sleeping 0 0 0 0 0  Tired, decreased energy 0 0 1 0 0  Change in appetite 0 0 0 0 0  Feeling bad or failure about yourself  0 0 0 0 0  Trouble concentrating 0 0 0 0 0  Moving slowly or fidgety/restless 0 0 0 0 0  Suicidal thoughts 0 0 0 0 0  PHQ-9 Score 0 0 1 0 0    See screening under Objective Information  Advanced Directives: A voluntary discussion about advance care planning including the explanation and discussion of advance directives was discussed with the patient. Explanation about the health care proxy and living will was reviewed.  During this discussion, the patient was able to identify a health care proxy as wife and plans/does not plan to fill out the paperwork required and will bring this to our office to keep on file. Does patient have a HCPOA?  no If yes, name and contact information: N/A Does patient have a living will or MOST form?  no  Cancer Screenings: Skin: discussed atypical lesions  Lung: Low Dose CT Chest recommended if Age 52-80 years, 30 pack-year currently smoking OR have quit w/in 15years. Patient does not qualify.  Lifestyle risk factor issued reviewed: Diet, exercise, weight management, advised patient smoking is not healthy, nutrition/diet.   Prostate: N/A Colon: Colonoscopy 11/26/19  Additional Screenings: Hepatitis B/HIV/Syphillis: N/A Hepatitis C Screening: 01/27/12 Intimate Partner Violence: Negative AAA Screen:  Men age 38 to 75 years if ever smoked recommended to get a one time AAA ultrasound screening exam. Patient does not qualify.  Objective:   Vitals: BP 132/72   Pulse 61   Resp 16   Ht 5\' 11"  (1.803 m)   Wt 163 lb (73.9 kg)   SpO2 95%   BMI 22.73 kg/m  Body mass index is 22.73 kg/m.  Lab Results  Component Value Date   CHOL 105 08/01/2022   CHOL 88 07/27/2021   CHOL 103 08/17/2020   Lab Results  Component Value Date   HDL 59 08/01/2022   HDL 49 07/27/2021   HDL 57 08/17/2020   Lab Results  Component Value Date   LDLCALC 29 08/01/2022   LDLCALC 23 07/27/2021   LDLCALC 30 08/17/2020   Lab Results  Component Value Date   TRIG 86 08/01/2022   TRIG 76 07/27/2021   TRIG 81 08/17/2020   Lab Results  Component Value Date   CHOLHDL 1.8 08/01/2022   CHOLHDL 1.8 07/27/2021   CHOLHDL 1.8 08/17/2020     Physical Exam Constitutional: Patient appears well-developed and well-nourished. Obese  No distress.  HEENT: head atraumatic, normocephalic, pupils equal and reactive to light, neck supple Cardiovascular: Normal rate, regular rhythm and normal heart sounds.  No murmur heard. No BLE edema. Pulmonary/Chest: Effort normal and breath sounds normal. No respiratory distress. Abdominal: Soft.  There is no tenderness. Psychiatric: Patient has a normal mood and affect. behavior is normal. Judgment and thought content normal.   Cognitive Testing - 6-CIT  Correct? Score   What year is it? yes 0 Yes = 0    No = 4  What month is it? yes 0 Yes = 0    No = 3  Remember:     Fl[place] [address]ins[address] [place], [place]     What time is it? yes 0 Yes = 0    No = 3  Count backwards from 20 to 1 yes 0 Correct = 0    1 error = 2   More than 1 error = 4  Say the months of the year in reverse. yes 0 Correct = 0    1 error = 2   More than 1 error = 4  What address did I ask you to remember? yes 0 Correct = 0  1 error = 2    2 error = 4    3 error = 6    4 error = 8    All wrong = 10       TOTAL  SCORE  0/28   Interpretation:  Normal  Normal (0-7) Abnormal (8-28)   Fall Risk:    10/24/2022    8:24 AM 08/01/2022    8:51 AM 01/26/2022    8:48 AM 10/18/2021    8:27 AM 07/27/2021    8:26 AM  Fall Risk   Falls in the past year? 0 0 0 0  0  Number falls in past yr: 0 0 0 0 0  Injury with Fall? 0 0 0 0 0  Risk for fall due to : No Fall Risks No Fall Risks No Fall Risks No Fall Risks No Fall Risks  Follow up Falls prevention discussed Falls prevention discussed Falls prevention discussed Falls prevention discussed Falls prevention discussed    Depression Screen    10/24/2022    8:24 AM 08/01/2022    8:51 AM 01/26/2022    8:48 AM 10/18/2021    8:27 AM 07/27/2021    8:26 AM  Depression screen PHQ 2/9  Decreased Interest 0 0 0 0 0  Down, Depressed, Hopeless 0 0 0 0 0  PHQ - 2 Score 0 0 0 0 0  Altered sleeping 0 0 0 0 0  Tired, decreased energy 0 0 1 0 0  Change in appetite 0 0 0 0 0  Feeling bad or failure about yourself  0 0 0 0 0  Trouble concentrating 0 0 0 0 0  Moving slowly or fidgety/restless 0 0 0 0 0  Suicidal thoughts 0 0 0 0 0  PHQ-9 Score 0 0 1 0 0      Assessment & Plan:    1. Encounter for Medicare annual wellness exam  - [place] SCREENING; Future  2. Colon cancer screening  - Ambulatory referral to Gastroenterology  3. History of gastric ulcer  - Ambulatory referral to Gastroenterology  4. Tobacco use  - [place] SCREENING; Future  5. Atherosclerosis of aorta (HCC)  - [place] SCREENING; Future  6. Nicotine dependence, cigarettes, in remission  - [place]ARE SCREENING; Future  7. Encounter for screening for cardiovascular disorders  - [place] MED[place] SCREENING; Future   Discussed cutting down on carbs   Exercise Activities and Dietary recommendations  Goals      DIET - INCREASE WATER INTAKE     Recommend to drink at least 6-8 8oz glasses of water per day.     Quit Smoking     If you wish to quit  smoking, help is available. For free tobacco cessation program offerings call the [facility] at [phone] or Live Well Line at [phone]. You may also visit [email or web address] or email [email or web address] for more information on other programs.         Discussed health benefits of physical activity, and encouraged him to engage in regular exercise appropriate for his age and condition.   Immunization History  Administered Date(s) Administered   Fluad Quad(high Dose 65+) 11/29/2018, 02/17/2020, 02/17/2021, 01/13/2022   Influenza Split 01/13/2009   Influenza, High Dose Seasonal PF 01/18/2016, 01/22/2017, 01/22/2018   Influenza, Seasonal, Injecte, Preservative Fre 01/31/2011   Influenza,inj,Quad PF,6+ Mos 02/21/2013, 03/02/2014, 11/25/2014   Influenza-Unspecified 03/02/2014   PFIZER(Purple Top)SARS-COV-2 Vaccination 06/05/2019, 07/02/2019, 03/25/2020   Pfizer Covid-19 Vaccine Bivalent Booster 7yrs & up 04/08/2021   Pneumococcal Conjugate-13 06/30/2014   Pneumococcal Polysaccharide-23 06/14/2009, 10/14/2015   Pneumococcal-Unspecified 06/14/2009   Td 11/10/2008   Tdap 11/10/2008, 03/23/2021   Zoster Recombinant(Shingrix) 11/30/2020, 03/04/2021   Zoster, Live 10/03/2011    Health Maintenance  Topic Date Due   COVID-19 Vaccine (5 - 2023-24 season) 12/09/2021   Colonoscopy  11/26/2022   INFLUENZA VACCINE  11/09/2022   Medicare Annual Wellness (AWV)  10/24/2023   DTaP/Tdap/Td (4 - Td or Tdap) 03/24/2031   Pneumonia Vaccine 57+ Years old  Completed   Hepatitis C Screening  Completed  Zoster Vaccines- Shingrix  Completed   HPV VACCINES  Aged Out   Lung Cancer Screening  Discontinued        Current Outpatient Medications:    aspirin 81 MG tablet, Take 1 tablet by mouth daily., Disp: , Rfl:    atorvastatin (LIPITOR) 40 MG tablet, Take 1 tablet by mouth once daily, Disp: 90 tablet, Rfl: 0   Cyanocobalamin (B-12) 1000 MCG TBCR, Take 1 tablet by  mouth daily., Disp: , Rfl:    gabapentin (NEURONTIN) 300 MG capsule, Take 1 capsule (300 mg total) by mouth 2 (two) times daily., Disp: 180 capsule, Rfl: 3   MULTIPLE VITAMIN PO, Take 1 tablet by mouth daily., Disp: , Rfl:    nitroGLYCERIN (NITROSTAT) 0.4 MG SL tablet, PLACE 1 TABLET UNDER THE TONGUE AS NEEDED FOR CHEST PAIN EVERY 5 MINTES, Disp: 25 tablet, Rfl: 1   pantoprazole (PROTONIX) 40 MG tablet, Take 1 tablet (40 mg total) by mouth daily., Disp: 90 tablet, Rfl: 1   tamsulosin (FLOMAX) 0.4 MG CAPS capsule, Take 1 capsule (0.4 mg total) by mouth daily., Disp: 90 capsule, Rfl: 3   umeclidinium-vilanterol (ANORO ELLIPTA) 62.5-25 MCG/ACT AEPB, INHALE 1 PUFF BY MOUTH DAILY, Disp: 60 each, Rfl: 5   varenicline (CHANTIX CONTINUING MONTH PAK) 1 MG tablet, Take 1 tablet (1 mg total) by mouth 2 (two) times daily., Disp: 60 tablet, Rfl: 4   Vitamin D, Cholecalciferol, 1000 UNITS CAPS, Take 1 tablet by mouth daily., Disp: , Rfl:  There are no discontinued medications.  I have personally reviewed and addressed the Medicare Annual Wellness health risk assessment questionnaire and have noted the following in the patient's chart:  A.         Medical and social history & family history B.         Use of alcohol, tobacco or illicit drugs  C.         Current medications and supplements D.         Functional and Cognitive ability and status E.         Nutritional status F.         Physical activity G.        Advance directives H.         List of other physicians I.          Hospitalizations, surgeries, and ER visits in previous 12 months J.         Vitals K.         Screenings such as hearing and vision if needed, cognitive and depression L.         Referrals and appointments   In addition, I have reviewed and discussed with patient certain preventive protocols, quality metrics, and best practice recommendations. A written personalized care plan for preventive services as well as general preventive  health recommendations were provided to patient.   See attached scanned questionnaire for additional information.   No follow-ups on file.

## 2022-10-24 ENCOUNTER — Telehealth: Payer: Self-pay

## 2022-10-24 ENCOUNTER — Encounter: Payer: Self-pay | Admitting: Family Medicine

## 2022-10-24 ENCOUNTER — Ambulatory Visit (INDEPENDENT_AMBULATORY_CARE_PROVIDER_SITE_OTHER): Payer: Medicare Other | Admitting: Family Medicine

## 2022-10-24 VITALS — BP 132/72 | HR 61 | Resp 16 | Ht 71.0 in | Wt 163.0 lb

## 2022-10-24 DIAGNOSIS — Z720 Tobacco use: Secondary | ICD-10-CM

## 2022-10-24 DIAGNOSIS — I700 Atherosclerosis of aorta: Secondary | ICD-10-CM

## 2022-10-24 DIAGNOSIS — Z1211 Encounter for screening for malignant neoplasm of colon: Secondary | ICD-10-CM

## 2022-10-24 DIAGNOSIS — Z136 Encounter for screening for cardiovascular disorders: Secondary | ICD-10-CM

## 2022-10-24 DIAGNOSIS — Z0000 Encounter for general adult medical examination without abnormal findings: Secondary | ICD-10-CM | POA: Diagnosis not present

## 2022-10-24 DIAGNOSIS — Z87891 Personal history of nicotine dependence: Secondary | ICD-10-CM

## 2022-10-24 DIAGNOSIS — F17210 Nicotine dependence, cigarettes, uncomplicated: Secondary | ICD-10-CM

## 2022-10-24 DIAGNOSIS — Z8711 Personal history of peptic ulcer disease: Secondary | ICD-10-CM

## 2022-10-24 DIAGNOSIS — F17211 Nicotine dependence, cigarettes, in remission: Secondary | ICD-10-CM

## 2022-10-24 DIAGNOSIS — Z122 Encounter for screening for malignant neoplasm of respiratory organs: Secondary | ICD-10-CM

## 2022-10-24 NOTE — Telephone Encounter (Signed)
PCP requests we contact patient to schedule annual LDCT.  Patient was scheduled July 2024 but was a no show for appt. Called patient but no answer. Left VM and call back number to call for scheduling. New order placed for annual LDCT, as previous order expired.

## 2022-10-31 ENCOUNTER — Ambulatory Visit
Admission: RE | Admit: 2022-10-31 | Discharge: 2022-10-31 | Disposition: A | Discharge status: Home / Self Care | Payer: Medicare Other | Source: Ambulatory Visit | Attending: Acute Care | Admitting: Acute Care

## 2022-10-31 DIAGNOSIS — F17210 Nicotine dependence, cigarettes, uncomplicated: Secondary | ICD-10-CM | POA: Insufficient documentation

## 2022-10-31 DIAGNOSIS — Z122 Encounter for screening for malignant neoplasm of respiratory organs: Secondary | ICD-10-CM | POA: Insufficient documentation

## 2022-10-31 DIAGNOSIS — Z87891 Personal history of nicotine dependence: Secondary | ICD-10-CM | POA: Diagnosis not present

## 2022-11-07 ENCOUNTER — Other Ambulatory Visit: Payer: Self-pay | Admitting: Acute Care

## 2022-11-07 DIAGNOSIS — F17210 Nicotine dependence, cigarettes, uncomplicated: Secondary | ICD-10-CM

## 2022-11-07 DIAGNOSIS — Z87891 Personal history of nicotine dependence: Secondary | ICD-10-CM

## 2022-11-07 DIAGNOSIS — Z122 Encounter for screening for malignant neoplasm of respiratory organs: Secondary | ICD-10-CM

## 2022-11-13 ENCOUNTER — Telehealth: Payer: Self-pay

## 2022-11-13 NOTE — Telephone Encounter (Signed)
Pt left message to schedule colonoscopy please return call  

## 2023-01-03 ENCOUNTER — Ambulatory Visit: Payer: Medicare Other | Admitting: Physician Assistant

## 2023-01-03 DIAGNOSIS — H524 Presbyopia: Secondary | ICD-10-CM | POA: Diagnosis not present

## 2023-01-03 DIAGNOSIS — H35033 Hypertensive retinopathy, bilateral: Secondary | ICD-10-CM | POA: Diagnosis not present

## 2023-01-03 NOTE — Progress Notes (Unsigned)
Kevin Amy, PA-C [address]  [address]  [place], [place] [address]  Main: [phone]  Fax: [phone]   Gastroenterology Consultation  Referring Provider:     Alba Cory, MD Primary Care Physician:  Alba Cory, MD Primary Gastroenterologist:  Kevin Amy, PA-C / Dr. Midge Minium   Reason for Consultation:     History of gastric ulcer; repeat colonoscopy        HPI:   Kevin Roberts is a 77 y.o. y/o male referred for consultation & management  by Alba Cory, MD.    He is currently taking pantoprazole 40 Mg once daily with good control of acid reflux.  He is not having any GI symptoms.  Specifically, he denies abdominal pain, heartburn, dysphagia, diarrhea, constipation, melena, hematochezia, weight loss, or anemia.  Last EGD done by Dr. Lemar Livings 11/2017, to evaluate epigastric pain, showed small hiatal hernia, diverticulum at the GE junction, normal stomach and duodenum.  No biopsies.  No Ulcers.  Last colonoscopy 11/2019 showed a 15 mm tubular adenoma polyp removed from cecum.  Excellent prep. 3 year repeat.  Lab 07/2022 showed normal CBC, B12, and folate.  Hemoglobin 14.6.     Past Medical History:  Diagnosis Date   Benign prostatic hypertrophy    Chronic kidney disease, stage III (moderate) (HCC)    COPD (chronic obstructive pulmonary disease) (HCC)    Cramps, muscle, general    Elevated ferritin    Emphysema of lung (HCC)    GERD (gastroesophageal reflux disease)    History of hypercalcemia    Hyperlipidemia    Hypertension    Incomplete bladder emptying    Nocturia    Nonobstructive CAD    a. 06/2015 MV: mid ant/antseptal/apical anterior ischemia; b. 06/2015 Cath: LM nl, LAD Ca2+, LCX min irregs, OM1/2/3 nl, RCA mod Ca2+, RPDA/RPAV/RPL1,2,3 nl. EF 55-65%.   Peripheral polyneuropathy    Rising PSA level    Tobacco use    Urgency of micturation    Vitamin B 12 deficiency     Past Surgical History:  Procedure Laterality Date   CARDIAC  CATHETERIZATION N/A 07/02/2015   Procedure: Left Heart Cath and Coronary Angiography;  Surgeon: Iran Ouch, MD;  Location: [facility];  Service: Cardiovascular;  Laterality: N/A;   COLONOSCOPY  2011   JWB   COLONOSCOPY WITH PROPOFOL N/A 11/26/2019   Procedure: COLONOSCOPY WITH PROPOFOL;  Surgeon: Earline Mayotte, MD;  Location: [facility];  Service: Endoscopy;  Laterality: N/A;   ESOPHAGOGASTRODUODENOSCOPY (EGD) WITH PROPOFOL N/A 11/14/2017   Procedure: ESOPHAGOGASTRODUODENOSCOPY (EGD) WITH PROPOFOL;  Surgeon: Earline Mayotte, MD;  Location: [facility]OS[facility];  Service: Endoscopy;  Laterality: N/A;   throat biopsy     lump removed    Prior to Admission medications   Medication Sig Start Date End Date Taking? Authorizing Provider  aspirin 81 MG tablet Take 1 tablet by mouth daily. 11/10/08   [provider]  atorvastatin (LIPITOR) 40 MG tablet Take 1 tablet by mouth once daily 09/25/22   Alba Cory, MD  Cyanocobalamin (B-12) 1000 MCG TBCR Take 1 tablet by mouth daily. 10/03/11   [provider]  gabapentin (NEURONTIN) 300 MG capsule Take 1 capsule (300 mg total) by mouth 2 (two) times daily. 08/01/22   Alba Cory, MD  MULTIPLE VITAMIN PO Take 1 tablet by mouth daily. 03/16/09   [provider]  nitroGLYCERIN (NITROSTAT) 0.4 MG SL tablet PLACE 1 TABLET UNDER THE TONGUE AS NEEDED FOR CHEST PAIN  EVERY 5 MINTES 03/15/22   Alba Cory, MD  pantoprazole (PROTONIX) 40 MG tablet Take 1 tablet (40 mg total) by mouth daily. 08/01/22   Alba Cory, MD  tamsulosin (FLOMAX) 0.4 MG CAPS capsule Take 1 capsule (0.4 mg total) by mouth daily. 02/09/22   McGowan, Carollee Herter A, PA-C  umeclidinium-vilanterol (ANORO ELLIPTA) 62.5-25 MCG/ACT AEPB INHALE 1 PUFF BY MOUTH DAILY 08/01/22   Alba Cory, MD  varenicline (CHANTIX CONTINUING MONTH PAK) 1 MG tablet Take 1 tablet (1 mg total) by mouth 2 (two) times daily. 08/01/22   Alba Cory, MD  Vitamin D,  Cholecalciferol, 1000 UNITS CAPS Take 1 tablet by mouth daily.    [provider]    Family History  Problem Relation Age of Onset   Hypertension Mother    CVA Mother    Cancer Father        Lung   Mental illness Brother        1-Schizophrenia   GER disease Son    Healthy Sister    Heart Problems Brother    Gallbladder disease Brother    Healthy Sister    Gallbladder disease Brother    Hypertension Brother      Social History   Tobacco Use   Smoking status: Every Day    Current packs/day: 0.25    Average packs/day: 0.3 packs/day for 49.2 years (12.3 ttl pk-yrs)    Types: Cigarettes    Start date: 10/04/1973   Smokeless tobacco: Never   Tobacco comments:    He is down to 1 cigarettes per day around 9 pm or midnight.     He is taking Chantix since Fall 2023  Vaping Use   Vaping status: Never Used  Substance Use Topics   Alcohol use: No    Alcohol/week: 0.0 standard drinks of alcohol   Drug use: No    Allergies as of 01/04/2023   (No Known Allergies)    Review of Systems:    All systems reviewed and negative except where noted in HPI.   Physical Exam:  BP 131/67   Pulse (!) 55   Temp 97.9 F (36.6 C)   Ht 5\' 11"  (1.803 m)   Wt 158 lb 12.8 oz (72 kg)   BMI 22.15 kg/m  No LMP for male patient.  General:   Alert,  Well-developed, well-nourished, pleasant and cooperative in NAD Lungs:  Respirations even and unlabored.  Clear throughout to auscultation.   No wheezes, crackles, or rhonchi. No acute distress. Heart:  Regular rate and rhythm; no murmurs, clicks, rubs, or gallops. Abdomen:  Normal bowel sounds.  No bruits.  Soft, and non-distended without masses, hepatosplenomegaly or hernias noted.  No Tenderness.  No guarding or rebound tenderness.    Neurologic:  Alert and oriented x3;  grossly normal neurologically. Psych:  Alert and cooperative. Normal mood and affect.  Imaging Studies: No results found.  Assessment and Plan:   Kevin Roberts is a  77 y.o. y/o male has been referred for:  Hx 15mm Tubular Adenomatous Colon Polyp removed 11/2019  3 year repeat surveillance Colonoscopy is due.  Scheduling Colonoscopy I discussed risks of colonoscopy with patient to include risk of bleeding, colon perforation, and risk of sedation.  Patient expressed understanding and agrees to proceed with colonoscopy.   Remote History of Peptic Ulcer  Last EGD in 2019 was normal.  No ulcers.  Patient is not having any upper GI symptoms.  No anemia.  Repeat EGD is not indicated.  3.  GERD -controlled on PPI  Continue pantoprazole 40 Mg once daily as needed.  Continue avoiding GERD trigger foods / drinks.   Follow up as needed based on colonoscopy results and GI symptoms.  Kevin Amy, PA-C

## 2023-01-04 ENCOUNTER — Ambulatory Visit (INDEPENDENT_AMBULATORY_CARE_PROVIDER_SITE_OTHER): Payer: Medicare Other | Admitting: Physician Assistant

## 2023-01-04 ENCOUNTER — Encounter: Payer: Self-pay | Admitting: Physician Assistant

## 2023-01-04 VITALS — BP 131/67 | HR 55 | Temp 97.9°F | Ht 71.0 in | Wt 158.8 lb

## 2023-01-04 DIAGNOSIS — Z86010 Personal history of colonic polyps: Secondary | ICD-10-CM

## 2023-01-04 MED ORDER — PEG 3350-KCL-NA BICARB-NACL 420 G PO SOLR: 4000.0000 mL | Freq: Once | ORAL | 0 refills | Status: AC

## 2023-01-09 ENCOUNTER — Ambulatory Visit: Payer: Medicare Other | Admitting: Anesthesiology

## 2023-01-09 ENCOUNTER — Encounter: Payer: Self-pay | Admitting: Gastroenterology

## 2023-01-09 ENCOUNTER — Encounter
Admission: RE | Disposition: A | Discharge status: Home / Self Care | Payer: Self-pay | Source: Home / Self Care | Attending: Gastroenterology

## 2023-01-09 ENCOUNTER — Ambulatory Visit
Admission: RE | Admit: 2023-01-09 | Discharge: 2023-01-09 | Disposition: A | Discharge status: Home / Self Care | Payer: Medicare Other | Attending: Gastroenterology | Admitting: Gastroenterology

## 2023-01-09 DIAGNOSIS — D122 Benign neoplasm of ascending colon: Secondary | ICD-10-CM | POA: Insufficient documentation

## 2023-01-09 DIAGNOSIS — K219 Gastro-esophageal reflux disease without esophagitis: Secondary | ICD-10-CM | POA: Insufficient documentation

## 2023-01-09 DIAGNOSIS — I25118 Atherosclerotic heart disease of native coronary artery with other forms of angina pectoris: Secondary | ICD-10-CM | POA: Diagnosis not present

## 2023-01-09 DIAGNOSIS — I129 Hypertensive chronic kidney disease with stage 1 through stage 4 chronic kidney disease, or unspecified chronic kidney disease: Secondary | ICD-10-CM | POA: Diagnosis not present

## 2023-01-09 DIAGNOSIS — K449 Diaphragmatic hernia without obstruction or gangrene: Secondary | ICD-10-CM | POA: Insufficient documentation

## 2023-01-09 DIAGNOSIS — I2510 Atherosclerotic heart disease of native coronary artery without angina pectoris: Secondary | ICD-10-CM | POA: Diagnosis not present

## 2023-01-09 DIAGNOSIS — K635 Polyp of colon: Secondary | ICD-10-CM

## 2023-01-09 DIAGNOSIS — Z1211 Encounter for screening for malignant neoplasm of colon: Secondary | ICD-10-CM | POA: Insufficient documentation

## 2023-01-09 DIAGNOSIS — K641 Second degree hemorrhoids: Secondary | ICD-10-CM | POA: Insufficient documentation

## 2023-01-09 DIAGNOSIS — D123 Benign neoplasm of transverse colon: Secondary | ICD-10-CM | POA: Insufficient documentation

## 2023-01-09 DIAGNOSIS — Z860100 Personal history of colon polyps, unspecified: Secondary | ICD-10-CM

## 2023-01-09 DIAGNOSIS — K648 Other hemorrhoids: Secondary | ICD-10-CM

## 2023-01-09 DIAGNOSIS — D120 Benign neoplasm of cecum: Secondary | ICD-10-CM | POA: Insufficient documentation

## 2023-01-09 DIAGNOSIS — K5730 Diverticulosis of large intestine without perforation or abscess without bleeding: Secondary | ICD-10-CM | POA: Insufficient documentation

## 2023-01-09 DIAGNOSIS — Z87891 Personal history of nicotine dependence: Secondary | ICD-10-CM | POA: Insufficient documentation

## 2023-01-09 DIAGNOSIS — N183 Chronic kidney disease, stage 3 unspecified: Secondary | ICD-10-CM | POA: Diagnosis not present

## 2023-01-09 HISTORY — PX: POLYPECTOMY: SHX5525

## 2023-01-09 HISTORY — PX: COLONOSCOPY WITH PROPOFOL: SHX5780

## 2023-01-09 SURGERY — COLONOSCOPY WITH PROPOFOL
Anesthesia: General

## 2023-01-09 MED ORDER — LIDOCAINE HCL (CARDIAC) PF 100 MG/5ML IV SOSY
PREFILLED_SYRINGE | INTRAVENOUS | Status: DC | PRN
  Administered 2023-01-09: 50 mg via INTRAVENOUS

## 2023-01-09 MED ORDER — SODIUM CHLORIDE 0.9 % IV SOLN
INTRAVENOUS | Status: DC
  Administered 2023-01-09: 1000 mL via INTRAVENOUS

## 2023-01-09 MED ORDER — PROPOFOL 10 MG/ML IV BOLUS
INTRAVENOUS | Status: DC | PRN
Start: 2023-01-09 — End: 2023-01-09
  Administered 2023-01-09: 40 mg via INTRAVENOUS
  Administered 2023-01-09: 90 mg via INTRAVENOUS
  Administered 2023-01-09 (×4): 40 mg via INTRAVENOUS

## 2023-01-09 NOTE — H&P (Signed)
Kevin Minium, MD [facility] [address], [address] [place], [place] [address] Phone:[phone] Fax : [phone]  Primary Care Physician:  Alba Cory, MD Primary Gastroenterologist:  Dr. Servando Snare  Pre-Procedure History & Physical: HPI:  Kevin Roberts is a 77 y.o. male is here for an colonoscopy.   Past Medical History:  Diagnosis Date   Benign prostatic hypertrophy    Chronic kidney disease, stage III (moderate) (HCC)    COPD (chronic obstructive pulmonary disease) (HCC)    Cramps, muscle, general    Elevated ferritin    Emphysema of lung (HCC)    GERD (gastroesophageal reflux disease)    History of hypercalcemia    Hyperlipidemia    Hypertension    Incomplete bladder emptying    Nocturia    Nonobstructive CAD    a. 06/2015 MV: mid ant/antseptal/apical anterior ischemia; b. 06/2015 Cath: LM nl, LAD Ca2+, LCX min irregs, OM1/2/3 nl, RCA mod Ca2+, RPDA/RPAV/RPL1,2,3 nl. EF 55-65%.   Peripheral polyneuropathy    Rising PSA level    Tobacco use    Urgency of micturation    Vitamin B 12 deficiency     Past Surgical History:  Procedure Laterality Date   CARDIAC CATHETERIZATION N/A 07/02/2015   Procedure: Left Heart Cath and Coronary Angiography;  Surgeon: Iran Ouch, MD;  Location: [facility];  Service: Cardiovascular;  Laterality: N/A;   COLONOSCOPY  2011   JWB   COLONOSCOPY WITH PROPOFOL N/A 11/26/2019   Procedure: COLONOSCOPY WITH PROPOFOL;  Surgeon: Earline Mayotte, MD;  Location: [facility];  Service: Endoscopy;  Laterality: N/A;   ESOPHAGOGASTRODUODENOSCOPY (EGD) WITH PROPOFOL N/A 11/14/2017   Procedure: ESOPHAGOGASTRODUODENOSCOPY (EGD) WITH PROPOFOL;  Surgeon: Earline Mayotte, MD;  Location: [facility];  Service: Endoscopy;  Laterality: N/A;   throat biopsy     lump removed    Prior to Admission medications   Medication Sig Start Date End Date Taking? Authorizing Provider  aspirin 81 MG tablet Take 1 tablet by mouth daily. 11/10/08  Yes  [provider]  atorvastatin (LIPITOR) 40 MG tablet Take 1 tablet by mouth once daily 09/25/22  Yes Sowles, Danna Hefty, MD  Cyanocobalamin (B-12) 1000 MCG TBCR Take 1 tablet by mouth daily. 10/03/11  Yes [provider]  gabapentin (NEURONTIN) 300 MG capsule Take 1 capsule (300 mg total) by mouth 2 (two) times daily. 08/01/22  Yes Sowles, Danna Hefty, MD  MULTIPLE VITAMIN PO Take 1 tablet by mouth daily. 03/16/09  Yes [provider]  pantoprazole (PROTONIX) 40 MG tablet Take 1 tablet (40 mg total) by mouth daily. 08/01/22  Yes Sowles, Danna Hefty, MD  tamsulosin (FLOMAX) 0.4 MG CAPS capsule Take 1 capsule (0.4 mg total) by mouth daily. 02/09/22  Yes McGowan, Carollee Herter A, PA-C  umeclidinium-vilanterol (ANORO ELLIPTA) 62.5-25 MCG/ACT AEPB INHALE 1 PUFF BY MOUTH DAILY 08/01/22  Yes Sowles, Danna Hefty, MD  varenicline (CHANTIX CONTINUING MONTH PAK) 1 MG tablet Take 1 tablet (1 mg total) by mouth 2 (two) times daily. 08/01/22  Yes Sowles, Danna Hefty, MD  Vitamin D, Cholecalciferol, 1000 UNITS CAPS Take 1 tablet by mouth daily.   Yes [provider]  nitroGLYCERIN (NITROSTAT) 0.4 MG SL tablet PLACE 1 TABLET UNDER THE TONGUE AS NEEDED FOR CHEST PAIN EVERY 5 MINTES 03/15/22   Alba Cory, MD    Allergies as of 01/04/2023   (No Known Allergies)    Family History  Problem Relation Age of Onset   Hypertension Mother    CVA Mother    Cancer  Father        Lung   Mental illness Brother        1-Schizophrenia   GER disease Son    Healthy Sister    Heart Problems Brother    Gallbladder disease Brother    Healthy Sister    Gallbladder disease Brother    Hypertension Brother     Social History   Socioeconomic History   Marital status: Married    Spouse name: Claudine   Number of children: 3   Years of education: some college   Highest education level: 12th grade  Occupational History   Occupation: Retired  Tobacco Use   Smoking status: Former    Current packs/day: 0.25     Average packs/day: 0.3 packs/day for 49.3 years (12.3 ttl pk-yrs)    Types: Cigarettes    Start date: 10/04/1973   Smokeless tobacco: Never   Tobacco comments:    He is down to 1 cigarettes per day around 9 pm or midnight.     He is taking Chantix since Fall 2023  Vaping Use   Vaping status: Never Used  Substance and Sexual Activity   Alcohol use: No    Alcohol/week: 0.0 standard drinks of alcohol   Drug use: No   Sexual activity: Not Currently    Partners: Female  Other Topics Concern   Not on file  Social History Narrative   Not on file   Social Determinants of Health   Financial Resource Strain: Low Risk  (10/24/2022)   Overall Financial Resource Strain (CARDIA)    Difficulty of Paying Living Expenses: Not hard at all  Food Insecurity: No Food Insecurity (10/24/2022)   Hunger Vital Sign    Worried About Running Out of Food in the Last Year: Never true    Ran Out of Food in the Last Year: Never true  Transportation Needs: No Transportation Needs (10/24/2022)   PRAPARE - Administrator, Civil Service (Medical): No    Lack of Transportation (Non-Medical): No  Physical Activity: Inactive (10/24/2022)   Exercise Vital Sign    Days of Exercise per Week: 0 days    Minutes of Exercise per Session: 0 min  Stress: No Stress Concern Present (10/24/2022)   [employer] of Occupational Health - Occupational Stress Questionnaire    Feeling of Stress : Not at all  Social Connections: Moderately Isolated (10/24/2022)   Social Connection and Isolation Panel [NHANES]    Frequency of Communication with Friends and Family: Three times a week    Frequency of Social Gatherings with Friends and Family: Twice a week    Attends Religious Services: Never    Database administrator or Organizations: No    Attends Banker Meetings: Never    Marital Status: Married  Catering manager Violence: Not At Risk (10/24/2022)   Humiliation, Afraid, Rape, and Kick questionnaire     Fear of Current or Ex-Partner: No    Emotionally Abused: No    Physically Abused: No    Sexually Abused: No    Review of Systems: See HPI, otherwise negative ROS  Physical Exam: BP (!) 181/73   Pulse (!) 40   Temp (!) 96.3 F (35.7 C) (Temporal)   Resp 14   Ht 5\' 11"  (1.803 m)   Wt 72.9 kg   SpO2 94%   BMI 22.40 kg/m  General:   Alert,  pleasant and cooperative in NAD Head:  Normocephalic and atraumatic. Neck:  Supple; no masses  or thyromegaly. Lungs:  Clear throughout to auscultation.    Heart:  Regular rate and rhythm. Abdomen:  Soft, nontender and nondistended. Normal bowel sounds, without guarding, and without rebound.   Neurologic:  Alert and  oriented x4;  grossly normal neurologically.  Impression/Plan: Caelin N Shoun is here for an colonoscopy to be performed for a history of adenomatous polyps on 2021   Risks, benefits, limitations, and alternatives regarding  colonoscopy have been reviewed with the patient.  Questions have been answered.  All parties agreeable.   Kevin Minium, MD  01/09/2023, 9:38 AM

## 2023-01-09 NOTE — Op Note (Signed)
[facility] Gastroenterology Patient Name: Kevin Roberts Procedure Date: 01/09/2023 9:38 AM MRN: [id number] Account #: [id number] Date of Birth: 12-06-1945 Admit Type: Outpatient Age: 77 Room: [facility] ENDO ROOM 4 Gender: Male Note Status: Finalized Instrument Name: Prentice Docker [id number] Procedure:             Colonoscopy Indications:           High risk colon cancer surveillance: Personal history                         of colonic polyps Providers:             Midge Minium MD, MD Referring MD:          Onnie Boer. Sowles, MD (Referring MD) Medicines:             Propofol per Anesthesia Complications:         No immediate complications. Procedure:             Pre-Anesthesia Assessment:                        - Prior to the procedure, a History and Physical was                         performed, and patient medications and allergies were                         reviewed. The patient's tolerance of previous                         anesthesia was also reviewed. The risks and benefits                         of the procedure and the sedation options and risks                         were discussed with the patient. All questions were                         answered, and informed consent was obtained. Prior                         Anticoagulants: The patient has taken no anticoagulant                         or antiplatelet agents. ASA Grade Assessment: II - A                         patient with mild systemic disease. After reviewing                         the risks and benefits, the patient was deemed in                         satisfactory condition to undergo the procedure.                        After obtaining informed consent, the colonoscope was  passed under direct vision. Throughout the procedure,                         the patient's blood pressure, pulse, and oxygen                         saturations were monitored continuously. The                          Colonoscope was introduced through the anus and                         advanced to the the cecum, identified by appendiceal                         orifice and ileocecal valve. The colonoscopy was                         performed without difficulty. The patient tolerated                         the procedure well. The quality of the bowel                         preparation was excellent. Findings:      The perianal and digital rectal examinations were normal.      A 5 mm polyp was found in the cecum. The polyp was sessile. The polyp       was removed with a cold snare. Resection and retrieval were complete.      A 4 mm polyp was found in the ascending colon. The polyp was sessile.       The polyp was removed with a cold snare. Resection and retrieval were       complete.      A 4 mm polyp was found in the transverse colon. The polyp was sessile.       The polyp was removed with a cold snare. Resection and retrieval were       complete.      Multiple small-mouthed diverticula were found in the sigmoid colon.      Non-bleeding internal hemorrhoids were found during retroflexion. The       hemorrhoids were Grade II (internal hemorrhoids that prolapse but reduce       spontaneously). Impression:            - One 5 mm polyp in the cecum, removed with a cold                         snare. Resected and retrieved.                        - One 4 mm polyp in the ascending colon, removed with                         a cold snare. Resected and retrieved.                        - One 4 mm polyp in the transverse colon, removed with  a cold snare. Resected and retrieved.                        - Diverticulosis in the sigmoid colon.                        - Non-bleeding internal hemorrhoids. Recommendation:        - Discharge patient to home.                        - Resume previous diet.                        - Continue present medications.                         - Await pathology results. Procedure Code(s):     --- Professional ---                        [phone], Colonoscopy, flexible; with removal of                         tumor(s), polyp(s), or other lesion(s) by snare                         technique Diagnosis Code(s):     --- Professional ---                        Z86.010, Personal history of colonic polyps                        D12.2, Benign neoplasm of ascending colon CPT copyright 2022 American Medical Association. All rights reserved. The codes documented in this report are preliminary and upon coder review may  be revised to meet current compliance requirements. Midge Minium MD, MD 01/09/2023 10:09:48 AM This report has been signed electronically. Number of Addenda: 0 Note Initiated On: 01/09/2023 9:38 AM Scope Withdrawal Time: 0 hours 11 minutes 1 second  Total Procedure Duration: 0 hours 16 minutes 43 seconds  Estimated Blood Loss:  Estimated blood loss: none.      [facility]

## 2023-01-09 NOTE — Transfer of Care (Signed)
Immediate Anesthesia Transfer of Care Note  Patient: Erion N Pann  Procedure(s) Performed: COLONOSCOPY WITH PROPOFOL POLYPECTOMY  Patient Location: Endoscopy Unit  Anesthesia Type:General  Level of Consciousness: awake, alert , and oriented  Airway & Oxygen Therapy: Patient Spontanous Breathing and Patient connected to nasal cannula oxygen  Post-op Assessment: Report given to RN, Post -op Vital signs reviewed and stable, and Patient moving all extremities  Post vital signs: Reviewed and stable  Last Vitals:  Vitals Value Taken Time  BP 149/80 01/09/23 1010  Temp 36.5 C 01/09/23 1009  Pulse 69 01/09/23 1012  Resp 14 01/09/23 1012  SpO2 97 % 01/09/23 1012  Vitals shown include unfiled device data.  Last Pain:  Vitals:   01/09/23 1009  TempSrc: Temporal  PainSc: 0-No pain         Complications: No notable events documented.

## 2023-01-09 NOTE — Anesthesia Preprocedure Evaluation (Addendum)
Anesthesia Evaluation  Patient identified by MRN, date of birth, ID band Patient awake    Reviewed: Allergy & Precautions, H&P , NPO status , Patient's Chart, lab work & pertinent test results  Airway Mallampati: II  TM Distance: >3 FB Neck ROM: full    Dental no notable dental hx.    Pulmonary COPD, former smoker   Pulmonary exam normal        Cardiovascular hypertension, + CAD (Nonobstructive CAD)  Normal cardiovascular exam     Neuro/Psych negative neurological ROS  negative psych ROS   GI/Hepatic Neg liver ROS, hiatal hernia,GERD  ,,  Endo/Other  negative endocrine ROS    Renal/GU Renal InsufficiencyRenal disease  negative genitourinary   Musculoskeletal   Abdominal   Peds  Hematology negative hematology ROS (+)   Anesthesia Other Findings Past Medical History: No date: Benign prostatic hypertrophy No date: Chronic kidney disease, stage III (moderate) (HCC) No date: COPD (chronic obstructive pulmonary disease) (HCC) No date: Cramps, muscle, general No date: Elevated ferritin No date: Emphysema of lung (HCC) No date: GERD (gastroesophageal reflux disease) No date: History of hypercalcemia No date: Hyperlipidemia No date: Hypertension No date: Incomplete bladder emptying No date: Nocturia No date: Nonobstructive CAD     Comment:  a. 06/2015 MV: mid ant/antseptal/apical anterior               ischemia; b. 06/2015 Cath: LM nl, LAD Ca2+, LCX min               irregs, OM1/2/3 nl, RCA mod Ca2+, RPDA/RPAV/RPL1,2,3 nl.               EF 55-65%. No date: Peripheral polyneuropathy No date: Rising PSA level No date: Tobacco use No date: Urgency of micturation No date: Vitamin B 12 deficiency  Past Surgical History: 07/02/2015: CARDIAC CATHETERIZATION; N/A     Comment:  Procedure: Left Heart Cath and Coronary Angiography;                Surgeon: Iran Ouch, MD;  Location: [facility] INVASIVE               CV LAB;   Service: Cardiovascular;  Laterality: N/A; 2011: COLONOSCOPY     Comment:  JWB 11/26/2019: COLONOSCOPY WITH PROPOFOL; N/A     Comment:  Procedure: COLONOSCOPY WITH PROPOFOL;  Surgeon: Earline Mayotte, MD;  Location: [facility] ENDOSCOPY;  Service:               Endoscopy;  Laterality: N/A; 11/14/2017: ESOPHAGOGASTRODUODENOSCOPY (EGD) WITH PROPOFOL; N/A     Comment:  Procedure: ESOPHAGOGASTRODUODENOSCOPY (EGD) WITH               PROPOFOL;  Surgeon: Earline Mayotte, MD;  Location:               [facility] ENDOSCOPY;  Service: Endoscopy;  Laterality: N/A; No date: throat biopsy     Comment:  lump removed     Reproductive/Obstetrics negative OB ROS                             Anesthesia Physical Anesthesia Plan  ASA: 3  Anesthesia Plan: General   Post-op Pain Management: Minimal or no pain anticipated   Induction: Intravenous  PONV Risk Score and Plan: Propofol infusion and TIVA  Airway Management Planned: Natural Airway  Additional Equipment:   Intra-op Plan:  Post-operative Plan:   Informed Consent: I have reviewed the patients History and Physical, chart, labs and discussed the procedure including the risks, benefits and alternatives for the proposed anesthesia with the patient or authorized representative who has indicated his/her understanding and acceptance.     Dental Advisory Given  Plan Discussed with: CRNA and Surgeon  Anesthesia Plan Comments:         Anesthesia Quick Evaluation

## 2023-01-10 ENCOUNTER — Encounter: Payer: Self-pay | Admitting: Gastroenterology

## 2023-01-10 LAB — SURGICAL PATHOLOGY

## 2023-01-10 NOTE — Anesthesia Postprocedure Evaluation (Signed)
Anesthesia Post Note  Patient: Kevin Roberts  Procedure(s) Performed: COLONOSCOPY WITH PROPOFOL POLYPECTOMY  Patient location during evaluation: Endoscopy Anesthesia Type: General Level of consciousness: awake and alert Pain management: pain level controlled Vital Signs Assessment: post-procedure vital signs reviewed and stable Respiratory status: spontaneous breathing, nonlabored ventilation and respiratory function stable Cardiovascular status: blood pressure returned to baseline and stable Postop Assessment: no apparent nausea or vomiting Anesthetic complications: no   No notable events documented.   Last Vitals:  Vitals:   01/09/23 1019 01/09/23 1039  BP: (!) 173/90 (!) 158/79  Pulse: 72 (!) 44  Resp: 15 12  Temp:  36.5 C  SpO2: 98% 99%    Last Pain:  Vitals:   01/09/23 1039  TempSrc: Temporal  PainSc: 0-No pain                 Foye Deer

## 2023-01-30 NOTE — Progress Notes (Unsigned)
Name: Kevin Roberts   MRN: [id number]    DOB: 05-Mar-1946   Date:01/31/2023       Progress Note  Subjective  Chief Complaint  Follow Up  HPI  COPD Moderate/Emphysema : he quit smoking 10/2022, he denies any cough, no wheezing, he states no longer having SOB. He is still using Anoro. He started taking Chantix 12/2021 and is still taking but down to one a day, still craves cigarettes in the morning and at night but is sucking on a lolly pop instead . He used to smoke 1.5 pack day for many years. Last CT lung was done 10/2022 and showed emphysema.    History of Alcoholism: he used to drink before retirement - about 2 shots at night, however when he retired at age 36 he started to drink about 1/5 of liquor every two days. He states he stopped drinking because he fell on his face, developed a gastric ulcer and B12 deficiency. He is doing well since he stopped drinking around 2010. He still has tingling on his legs He takes gabapentin and symptoms are stable, no longer has pain just tingling He is able to button his shirts . Symptoms are stable    GERD and history of gastric ulcer:/hiatal hernia   He states symptoms controlled as long as he takes a PPI, no heartburn or regurgitation. He was seen by Dr. Birdie Sons and had EGD in August 2019. He has been doing well and needs refill of medication , we will try going down on dose to 20 mg daily and monitor for recurrence of symptoms   Hyperlipidemia: taking Atorvastatin and denies side effects. Taking aspirin daily .  LDL has been at goal . Continue current regiment   CAD/Angina : found on CT chest 3 vessel disease he has intermittent chest pain , SOB  has resolved. He is still taking  aspirin and statin therapy, he is off beta blocker due to bradycardia, bp is controlled off medication. He continues to have chest pain that is intermittent a few times a month and usually secondary to stress. Cardiac cath in 2017 showed mild to moderate non-obstructive disease  and is only on medical management. Unable to tolerate Imdur - it caused hypotension .    B12 neuropathy: he is taking B12 supplementation and also takes Neurontin , symptoms are stable   Atherosclerosis abdominal aorta: on medical management, aspirin,  and statin therapy. He quit smoking   CKI: stage III, he avoiding NSAID's, no pruritis, good urine output , GFR has been stable . Reviewed labs with patient   Renal Cyst: picked up on CT lung and had follow up renal US 2020  benign cyst . Sees Urologist yearly . Keep follow up  Left gynecomastia: we did blood work i that was normal.  No pain or discomfort , CT did not show anything but we will check breast US   BPH: under the care of Urologist , IPSS score was 7 recently, PSA  normalized Fall 2023, he has an upcoming with urologist this Fall  Bradycardia: he had a colonoscopy, bp spiked and heart rate dropped to 40, he states they also did an EKG but I cannot see the report. He has intermittent palpitation.   Patient Active Problem List   Diagnosis Date Noted   History of colon polyps 01/09/2023   Polyp of sigmoid colon 01/09/2023   Centrilobular emphysema (HCC) 01/26/2022   Bronchiectasis without complication (HCC) 02/17/2020   History of gastric ulcer 02/17/2020  Hiatal hernia 01/22/2018   Abdominal pain, chronic, right lower quadrant 10/26/2017   Esophageal diverticulum 10/26/2017   History of alcoholism (HCC) 01/18/2016   Gynecomastia 10/14/2015   Abnormal nuclear stress test    Anginal equivalent (HCC)    Atherosclerosis of aorta (HCC) 04/15/2015   BPH with obstruction/lower urinary tract symptoms 02/03/2015   Renal cyst, left 10/13/2014   Tobacco use    B12 neuropathy (HCC) 10/04/2014   CAD, multiple vessel 10/04/2014   Benign essential HTN 10/04/2014   Chronic kidney disease (CKD), stage III (moderate) (HCC) 10/04/2014   Dyslipidemia 10/04/2014   COPD, moderate (HCC) 10/04/2014   Gastro-esophageal reflux disease without  esophagitis 10/04/2014   Enlarged prostate 10/04/2014   Vitamin D deficiency 10/04/2014    Past Surgical History:  Procedure Laterality Date   CARDIAC CATHETERIZATION N/A 07/02/2015   Procedure: Left Heart Cath and Coronary Angiography;  Surgeon: Iran Ouch, MD;  Location: [facility] INVASIVE CV LAB;  Service: Cardiovascular;  Laterality: N/A;   COLONOSCOPY  2011   [facility]   COLONOSCOPY WITH PROPOFOL N/A 11/26/2019   Procedure: COLONOSCOPY WITH PROPOFOL;  Surgeon: Earline Mayotte, MD;  Location: [facility] ENDOSCOPY;  Service: Endoscopy;  Laterality: N/A;   COLONOSCOPY WITH PROPOFOL N/A 01/09/2023   Procedure: COLONOSCOPY WITH PROPOFOL;  Surgeon: Midge Minium, MD;  Location: [facility] ENDOSCOPY;  Service: Endoscopy;  Laterality: N/A;   ESOPHAGOGASTRODUODENOSCOPY (EGD) WITH PROPOFOL N/A 11/14/2017   Procedure: ESOPHAGOGASTRODUODENOSCOPY (EGD) WITH PROPOFOL;  Surgeon: Earline Mayotte, MD;  Location: [facility] ENDOSCOPY;  Service: Endoscopy;  Laterality: N/A;   POLYPECTOMY  01/09/2023   Procedure: POLYPECTOMY;  Surgeon: Midge Minium, MD;  Location: [facility] ENDOSCOPY;  Service: Endoscopy;;   throat biopsy     lump removed    Family History  Problem Relation Age of Onset   Hypertension Mother    CVA Mother    Cancer Father        Lung   Mental illness Brother        1-Schizophrenia   GER disease Son    Healthy Sister    Heart Problems Brother    Gallbladder disease Brother    Healthy Sister    Gallbladder disease Brother    Hypertension Brother     Social History   Tobacco Use   Smoking status: Former    Current packs/day: 0.00    Average packs/day: 0.2 packs/day for 49.1 years (12.3 ttl pk-yrs)    Types: Cigarettes    Start date: 10/04/1973    Quit date: 11/02/2022    Years since quitting: 0.2   Smokeless tobacco: Never   Tobacco comments:    He is taking Chantix since Fall 2023    He finally completely quit July 2024  Substance Use Topics   Alcohol use: No    Alcohol/week: 0.0 standard  drinks of alcohol     Current Outpatient Medications:    aspirin 81 MG tablet, Take 1 tablet by mouth daily., Disp: , Rfl:    atorvastatin (LIPITOR) 40 MG tablet, Take 1 tablet by mouth once daily, Disp: 90 tablet, Rfl: 0   Cyanocobalamin (B-12) 1000 MCG TBCR, Take 1 tablet by mouth daily., Disp: , Rfl:    gabapentin (NEURONTIN) 300 MG capsule, Take 1 capsule (300 mg total) by mouth 2 (two) times daily., Disp: 180 capsule, Rfl: 3   MULTIPLE VITAMIN PO, Take 1 tablet by mouth daily., Disp: , Rfl:    nitroGLYCERIN (NITROSTAT) 0.4 MG SL tablet, PLACE 1 TABLET UNDER THE TONGUE AS  NEEDED FOR CHEST PAIN EVERY 5 MINTES, Disp: 25 tablet, Rfl: 1   tamsulosin (FLOMAX) 0.4 MG CAPS capsule, Take 1 capsule (0.4 mg total) by mouth daily., Disp: 90 capsule, Rfl: 3   umeclidinium-vilanterol (ANORO ELLIPTA) 62.5-25 MCG/ACT AEPB, INHALE 1 PUFF BY MOUTH DAILY, Disp: 60 each, Rfl: 5   varenicline (CHANTIX CONTINUING MONTH PAK) 1 MG tablet, Take 1 tablet (1 mg total) by mouth 2 (two) times daily., Disp: 60 tablet, Rfl: 4   Vitamin D, Cholecalciferol, 1000 UNITS CAPS, Take 1 tablet by mouth daily., Disp: , Rfl:    pantoprazole (PROTONIX) 20 MG tablet, Take 1 tablet (20 mg total) by mouth daily., Disp: 90 tablet, Rfl: 1  No Known Allergies  I personally reviewed active problem list, medication list, allergies, family history, social history, health maintenance with the patient/caregiver today.   ROS  Ten systems reviewed and is negative except as mentioned in HPI    Objective  Vitals:   01/31/23 0835  BP: 126/68  Pulse: (!) 54  Resp: 14  Temp: 97.8 F (36.6 C)  TempSrc: Oral  SpO2: 96%  Weight: 163 lb 4.8 oz (74.1 kg)  Height: 5\' 11"  (1.803 m)    Body mass index is 22.78 kg/m.  Physical Exam  Constitutional: Patient appears well-developed and well-nourished. Obese  No distress.  HEENT: head atraumatic, normocephalic, pupils equal and reactive to light, neck supple, throat within normal  limits Cardiovascular: bradycardia , regular rhythm and normal heart sounds.  No murmur heard. No BLE edema. Pulmonary/Chest: Effort normal and breath sounds normal. No respiratory distress. Abdominal: Soft.  There is no tenderness. Psychiatric: Patient has a normal mood and affect. behavior is normal. Judgment and thought content normal.    PHQ2/9:    01/31/2023    8:37 AM 10/24/2022    8:24 AM 08/01/2022    8:51 AM 01/26/2022    8:48 AM 10/18/2021    8:27 AM  Depression screen PHQ 2/9  Decreased Interest 0 0 0 0 0  Down, Depressed, Hopeless 0 0 0 0 0  PHQ - 2 Score 0 0 0 0 0  Altered sleeping 0 0 0 0 0  Tired, decreased energy 0 0 0 1 0  Change in appetite 0 0 0 0 0  Feeling bad or failure about yourself  0 0 0 0 0  Trouble concentrating 0 0 0 0 0  Moving slowly or fidgety/restless 0 0 0 0 0  Suicidal thoughts 0 0 0 0 0  PHQ-9 Score 0 0 0 1 0    phq 9 is negative   Fall Risk:    01/31/2023    8:37 AM 10/24/2022    8:24 AM 08/01/2022    8:51 AM 01/26/2022    8:48 AM 10/18/2021    8:27 AM  Fall Risk   Falls in the past year? 0 0 0 0 0  Number falls in past yr:  0 0 0 0  Injury with Fall?  0 0 0 0  Risk for fall due to : No Fall Risks No Fall Risks No Fall Risks No Fall Risks No Fall Risks  Follow up Falls prevention discussed Falls prevention discussed Falls prevention discussed Falls prevention discussed Falls prevention discussed      Functional Status Survey: Is the patient deaf or have difficulty hearing?: No Does the patient have difficulty seeing, even when wearing glasses/contacts?: No Does the patient have difficulty concentrating, remembering, or making decisions?: No Does the patient have difficulty walking  or climbing stairs?: No Does the patient have difficulty dressing or bathing?: No Does the patient have difficulty doing errands alone such as visiting a doctor's office or shopping?: No    Assessment & Plan  1. Centrilobular emphysema (HCC)  He  quit smoking and is taking Anoro   2. Atherosclerosis of aorta (HCC)  On statin therapy and aspirin  3. Stage 3a chronic kidney disease (HCC)  We will continue to monitor   4. History of alcoholism (HCC)  Stable   5. B12 neuropathy (HCC)  Continue SL supplement  6. Anginal equivalent (HCC)  He has NTG  7. History of gastric ulcer  - pantoprazole (PROTONIX) 20 MG tablet; Take 1 tablet (20 mg total) by mouth daily.  Dispense: 90 tablet; Refill: 1  8. GERD without esophagitis  - pantoprazole (PROTONIX) 20 MG tablet; Take 1 tablet (20 mg total) by mouth daily.  Dispense: 90 tablet; Refill: 1  9. Gynecomastia  - [place] LIMITED ULTRASOUND INCLUDING AXILLA LEFT BREAST ; Future  10. Bradycardia  Contacted Dr. Kirke Corin, advised patient to call him if he does not hear back from his office  11. Need for immunization against influenza  - Flu Vaccine Trivalent High Dose (Fluad)  12. CAD, multiple vessel    12. Rhinorrhea   Going on for a few days, advised claritin, no fever or chills , gargle with warm salted water for the scratchy throat

## 2023-01-31 ENCOUNTER — Encounter: Payer: Self-pay | Admitting: Family Medicine

## 2023-01-31 ENCOUNTER — Ambulatory Visit (INDEPENDENT_AMBULATORY_CARE_PROVIDER_SITE_OTHER): Payer: Medicare Other | Admitting: Family Medicine

## 2023-01-31 ENCOUNTER — Telehealth: Payer: Self-pay | Admitting: *Deleted

## 2023-01-31 VITALS — BP 126/68 | HR 54 | Temp 97.8°F | Resp 14 | Ht 71.0 in | Wt 163.3 lb

## 2023-01-31 DIAGNOSIS — Z23 Encounter for immunization: Secondary | ICD-10-CM

## 2023-01-31 DIAGNOSIS — J432 Centrilobular emphysema: Secondary | ICD-10-CM

## 2023-01-31 DIAGNOSIS — J3489 Other specified disorders of nose and nasal sinuses: Secondary | ICD-10-CM

## 2023-01-31 DIAGNOSIS — I2089 Other forms of angina pectoris: Secondary | ICD-10-CM

## 2023-01-31 DIAGNOSIS — G63 Polyneuropathy in diseases classified elsewhere: Secondary | ICD-10-CM

## 2023-01-31 DIAGNOSIS — I700 Atherosclerosis of aorta: Secondary | ICD-10-CM | POA: Diagnosis not present

## 2023-01-31 DIAGNOSIS — N1831 Chronic kidney disease, stage 3a: Secondary | ICD-10-CM | POA: Diagnosis not present

## 2023-01-31 DIAGNOSIS — F1021 Alcohol dependence, in remission: Secondary | ICD-10-CM

## 2023-01-31 DIAGNOSIS — E538 Deficiency of other specified B group vitamins: Secondary | ICD-10-CM

## 2023-01-31 DIAGNOSIS — N62 Hypertrophy of breast: Secondary | ICD-10-CM

## 2023-01-31 DIAGNOSIS — R001 Bradycardia, unspecified: Secondary | ICD-10-CM

## 2023-01-31 DIAGNOSIS — Z8711 Personal history of peptic ulcer disease: Secondary | ICD-10-CM

## 2023-01-31 DIAGNOSIS — K219 Gastro-esophageal reflux disease without esophagitis: Secondary | ICD-10-CM

## 2023-01-31 MED ORDER — PANTOPRAZOLE SODIUM 20 MG PO TBEC
20.0000 mg | DELAYED_RELEASE_TABLET | Freq: Every day | ORAL | 1 refills | Status: DC
Start: 2023-01-31 — End: 2023-08-03

## 2023-01-31 NOTE — Telephone Encounter (Signed)
Attempted to reach the patient but was unable to leave a message. The patient needs an appointment this week with APP for palpitations, per PCP.

## 2023-01-31 NOTE — Telephone Encounter (Signed)
Appointment made for 10/28. The patient was unable to come Thursday or Friday of this week.

## 2023-02-01 ENCOUNTER — Other Ambulatory Visit: Payer: Self-pay | Admitting: Family Medicine

## 2023-02-01 DIAGNOSIS — N62 Hypertrophy of breast: Secondary | ICD-10-CM

## 2023-02-03 NOTE — Progress Notes (Unsigned)
Cardiology Clinic Note   Date: 02/05/2023 ID: VIDUR ARCOS, DOB 1946-03-05, MRN [id number]  Primary Cardiologist:  Lorine Bears, MD  Patient Profile    Kevin Roberts is a 77 y.o. male who presents to the clinic today for evaluation of bradycardia.     Past medical history significant for: Nonobstructive CAD. Nuclear stress test 06/18/2015: Medium defect of moderate severity present in the mid anterior, mid anteroseptal and apical anterior location suggestive of ischemia in LAD distribution, intermediate risk. LHC 07/02/2015: Proximal LAD 20%.  Mid LAD 40%.  Ostial D2-D2 30%.  Ostial D1-D1 20%.  Mid LCx 20%.  Proximal RCA 20%.  Recommend medical therapy and smoking cessation. Hypertension. Hyperlipidemia. Lipid panel 08/01/2022: LDL 29, HDL 59, TG 86, total 105. COPD. CKD stage III. Tobacco abuse.  In summary, three-vessel coronary calcification found incidentally on CT chest in 2017 prompting stress testing.  Stress test March 2017 showed findings suggestive of ischemia in LAD distribution.  LHC for further evaluation showed heavily calcified LAD with moderate calcifications of the RCA but no significant obstructive CAD and recommendation was made for medical management.  In August 2022 he reported fatigue and was placed on isosorbide which was later discontinued secondary to intolerance.  In March 2023 he reported 4 episodes of chest discomfort awakening him at night lasting about 30 minutes and resolving with sitting up.  Symptoms were tied to eating spicy foods and he deferred any additional ischemic testing.     History of Present Illness    Kevin Roberts is followed by Dr. Kirke Corin for the above outlined history.  Patient was last seen in the office on 06/08/2022 by Dr. [place] Sink for routine follow-up.  He was doing well at that time and working on smoking cessation with Chantix.  No medication changes were made at that time.  Patient was evaluated by PCP on 01/31/2023.  He reported  during her colonoscopy his blood pressure elevated and heart rate dropped to 40.  An EKG was performed at that time.  He reported intermittent palpitations.  Heart rate 54 bpm and BP 126/68 at the time of his visit.  Patient was instructed to follow-up with cardiology.  Discussed the use of AI scribe software for clinical note transcription with the patient, who gave verbal consent to proceed.  The patient presents for a follow-up after elevated BP and bradycardia during colonoscopy. The patient reports feeling 'normal' and denies any symptoms of lightheadedness, dizziness, near syncope or syncope. He is not as active as he used to be, with his primary activity being daily walks around [place]. He denies any current chest pain, shortness of breath, or lower extremity edema. The patient is a former smoker and is currently taking Chantix to aid in smoking cessation. He has a BP cuff at home but does not normally check it.        ROS: All other systems reviewed and are otherwise negative except as noted in History of Present Illness.  Studies Reviewed    EKG Interpretation Date/Time:  Monday February 05 2023 10:06:01 EDT Ventricular Rate:  51 PR Interval:  226 QRS Duration:  102 QT Interval:  462 QTC Calculation: 425 R Axis:   8  Text Interpretation: Sinus bradycardia with 1st degree A-V block with Premature supraventricular complexes Nonspecific T wave abnormality When compared with ECG of 06/08/2022 no significant change Confirmed by Carlos Levering [phone]) on 02/05/2023 10:16:44 AM   Risk Assessment/Calculations      HYPERTENSION CONTROL Vitals:  02/05/23 1000 02/05/23 1007 02/05/23 1202  BP: (!) 168/80 (!) 160/90 (!) 140/80    The patient's blood pressure is elevated above target today.  In order to address the patient's elevated BP: Blood pressure will be monitored at home to determine if medication changes need to be made.           Physical Exam    VS:  BP (!) 160/90  (BP Location: Left Arm, Patient Position: Sitting, Cuff Size: Normal) Comment: Post ekg  Pulse (!) 51   Ht 5\' 11"  (1.803 m)   Wt 162 lb 6 oz (73.7 kg)   SpO2 96%   BMI 22.65 kg/m  , BMI Body mass index is 22.65 kg/m.  GEN: Well nourished, well developed, in no acute distress. Neck: No JVD or carotid bruits. Cardiac:  RRR. No murmurs. No rubs or gallops.   Respiratory:  Respirations regular and unlabored. Clear to auscultation without rales, wheezing or rhonchi. GI: Soft, nontender, nondistended. Extremities: Radials/DP/PT 2+ and equal bilaterally. No clubbing or cyanosis. No edema.  Skin: Warm and dry, no rash. Neuro: Strength intact.  Assessment & Plan      Bradycardia Baseline heart rate in the 50s, with a drop to the 40s during colonoscopy possibly due to sedation. No symptoms of lightheadedness, dizziness, syncope, or fatigue. -No further action required at this time. -If symptoms develop, patient advised to contact the office.  Nonobstructive CAD LHC March 2017 showed heavily calcified LAD with moderate calcifications of the RCA but no obstructive CAD.  Patient denies chest pain, pressure or tightness. He stays active walking around [place] just about daily and he does yard work in the warmer months.  -Continue aspirin, atorvastatin, as needed SL NTG.  Hypertension BP today 160/90 on intake and 140/80 on my recheck. No lightheadedness, dizziness or unsteadiness.   -Not currently on antihypertensives. -Check BP for 1 week and contact office with readings. Consider starting amlodipine if consistently >130/80.  Hyperlipidemia LDL April 2024 29, at goal. -Continue atorvastatin.  Tobacco abuse Patient reports complete smoking cessation managed with Chantix.  -Continue Chantix as prescribed by PCP.      Disposition: Return in 1 year or sooner as needed.          Signed, Etta Grandchild. Amandy Chubbuck, DNP, NP-C

## 2023-02-05 ENCOUNTER — Ambulatory Visit: Payer: Medicare Other | Attending: Student | Admitting: Student

## 2023-02-05 ENCOUNTER — Encounter: Payer: Self-pay | Admitting: Student

## 2023-02-05 ENCOUNTER — Other Ambulatory Visit: Payer: Self-pay | Admitting: *Deleted

## 2023-02-05 VITALS — BP 140/80 | HR 51 | Ht 71.0 in | Wt 162.4 lb

## 2023-02-05 DIAGNOSIS — R001 Bradycardia, unspecified: Secondary | ICD-10-CM | POA: Insufficient documentation

## 2023-02-05 DIAGNOSIS — I700 Atherosclerosis of aorta: Secondary | ICD-10-CM | POA: Insufficient documentation

## 2023-02-05 DIAGNOSIS — E785 Hyperlipidemia, unspecified: Secondary | ICD-10-CM | POA: Diagnosis not present

## 2023-02-05 DIAGNOSIS — I10 Essential (primary) hypertension: Secondary | ICD-10-CM | POA: Insufficient documentation

## 2023-02-05 DIAGNOSIS — Z87891 Personal history of nicotine dependence: Secondary | ICD-10-CM | POA: Insufficient documentation

## 2023-02-05 DIAGNOSIS — I2510 Atherosclerotic heart disease of native coronary artery without angina pectoris: Secondary | ICD-10-CM | POA: Insufficient documentation

## 2023-02-05 NOTE — Patient Instructions (Signed)
MONITOR Blood pressure & heart rates for one week then give [place] a call with those readings.    Medication Instructions:  No changes at this time.   *If you need a refill on your cardiac medications before your next appointment, please call your pharmacy*   Lab Work: None  If you have labs (blood work) drawn today and your tests are completely normal, you will receive your results only by: MyChart Message (if you have MyChart) OR A paper copy in the mail If you have any lab test that is abnormal or we need to change your treatment, we will call you to review the results.   Testing/Procedures: None   Follow-Up: At [facility], you and your health needs are our priority.  As part of our continuing mission to provide you with exceptional heart care, we have created designated Provider Care Teams.  These Care Teams include your primary Cardiologist (physician) and Advanced Practice Providers (APPs -  Physician Assistants and Nurse Practitioners) who all work together to provide you with the care you need, when you need it.  We recommend signing up for the patient portal called "MyChart".  Sign up information is provided on this After Visit Summary.  MyChart is used to connect with patients for Virtual Visits (Telemedicine).  Patients are able to view lab/test results, encounter notes, upcoming appointments, etc.  Non-urgent messages can be sent to your provider as well.   To learn more about what you can do with MyChart, go to [email or web address].    Your next appointment:   1 year(s)  Provider:   You may see Lorine Bears, MD or one of the following Advanced Practice Providers on your designated Care Team:   Nicolasa Ducking, NP Eula Listen, PA-C Cadence Fransico Michael, PA-C Charlsie Quest, NP

## 2023-02-08 ENCOUNTER — Other Ambulatory Visit: Payer: Medicare Other

## 2023-02-08 DIAGNOSIS — N138 Other obstructive and reflux uropathy: Secondary | ICD-10-CM | POA: Diagnosis not present

## 2023-02-08 DIAGNOSIS — N401 Enlarged prostate with lower urinary tract symptoms: Secondary | ICD-10-CM | POA: Diagnosis not present

## 2023-02-08 DIAGNOSIS — R350 Frequency of micturition: Secondary | ICD-10-CM | POA: Diagnosis not present

## 2023-02-09 LAB — PSA: Prostate Specific Ag, Serum: 2.5 ng/mL (ref 0.0–4.0)

## 2023-02-12 ENCOUNTER — Ambulatory Visit
Admission: RE | Admit: 2023-02-12 | Discharge: 2023-02-12 | Disposition: A | Discharge status: Home / Self Care | Payer: Medicare Other | Source: Ambulatory Visit | Attending: Family Medicine | Admitting: Family Medicine

## 2023-02-12 ENCOUNTER — Inpatient Hospital Stay
Admission: RE | Admit: 2023-02-12 | Discharge: 2023-02-12 | Disposition: A | Discharge status: Home / Self Care | Payer: Medicare Other | Source: Ambulatory Visit | Attending: Family Medicine | Admitting: Family Medicine

## 2023-02-12 DIAGNOSIS — R92313 Mammographic fatty tissue density, bilateral breasts: Secondary | ICD-10-CM | POA: Diagnosis not present

## 2023-02-12 DIAGNOSIS — N62 Hypertrophy of breast: Secondary | ICD-10-CM | POA: Insufficient documentation

## 2023-02-13 ENCOUNTER — Telehealth: Payer: Self-pay | Admitting: Student

## 2023-02-13 ENCOUNTER — Encounter: Payer: Self-pay | Admitting: Urology

## 2023-02-13 ENCOUNTER — Ambulatory Visit: Payer: Medicare Other | Admitting: Urology

## 2023-02-13 VITALS — BP 156/87 | HR 55 | Ht 71.0 in | Wt 162.0 lb

## 2023-02-13 DIAGNOSIS — R350 Frequency of micturition: Secondary | ICD-10-CM

## 2023-02-13 DIAGNOSIS — N401 Enlarged prostate with lower urinary tract symptoms: Secondary | ICD-10-CM

## 2023-02-13 DIAGNOSIS — N138 Other obstructive and reflux uropathy: Secondary | ICD-10-CM

## 2023-02-13 LAB — BLADDER SCAN AMB NON-IMAGING: Scan Result: 10

## 2023-02-13 MED ORDER — TAMSULOSIN HCL 0.4 MG PO CAPS
0.4000 mg | ORAL_CAPSULE | Freq: Every day | ORAL | 3 refills | Status: DC
Start: 2023-02-13 — End: 2024-02-15

## 2023-02-13 NOTE — Progress Notes (Signed)
Order(s) created erroneously. Erroneous order ID: [id number]  Order moved by: CHART CORRECTION ANALYST, FIFTEEN  Order move date/time: 02/13/2023 2:52 PM  Source Patient: [id number]  Source Contact: 02/05/2023  Destination Patient: [id number]  Destination Contact: 09/20/2022

## 2023-02-13 NOTE — Telephone Encounter (Signed)
Patient dropped of Blood Pressure Log. Placed in nurse box.

## 2023-02-19 MED ORDER — AMLODIPINE BESYLATE 5 MG PO TABS: 5.0000 mg | ORAL_TABLET | Freq: Every day | ORAL | 3 refills | Status: DC

## 2023-02-19 NOTE — Telephone Encounter (Signed)
Spoke with patient and reviewed provider recommendations. He was agreeable with plan and had no further questions at this time.    Kevin Levering, NP  Sent[employer] Flow February 19, 2023  3:14 PM  To: Bryna Colander, RN   Message  I reviewed patient's BP log. Will you please ask him to start amlodipine 5 mg daily. Call in 1 week BP log after starting medication.    Thank you!    DW

## 2023-03-14 ENCOUNTER — Other Ambulatory Visit: Payer: Self-pay | Admitting: Family Medicine

## 2023-03-14 DIAGNOSIS — J432 Centrilobular emphysema: Secondary | ICD-10-CM

## 2023-03-14 DIAGNOSIS — Z720 Tobacco use: Secondary | ICD-10-CM

## 2023-03-29 ENCOUNTER — Telehealth: Payer: Self-pay | Admitting: Student

## 2023-03-29 NOTE — Telephone Encounter (Signed)
Spoke to patient and informed him of the following:  "Will you please contact the patient and let him know there are no medication changes needed. BP looks well controlled."

## 2023-03-29 NOTE — Telephone Encounter (Signed)
BP log given to provider

## 2023-03-29 NOTE — Telephone Encounter (Signed)
Patient dropped off BP log with readings from 12/2 to 03/28/2023. Will scan into chart. BP is adequately controlled. No medication changes needed.   Etta Grandchild. Levi Crass, DNP, NP-C  03/29/2023, 1:43 PM [facility] [address] Office [phone] Fax [phone]

## 2023-03-29 NOTE — Telephone Encounter (Signed)
 Patient dropped of Blood Pressure Log. Placed in nurse box.

## 2023-04-09 ENCOUNTER — Other Ambulatory Visit: Payer: Self-pay | Admitting: Family Medicine

## 2023-04-09 DIAGNOSIS — J432 Centrilobular emphysema: Secondary | ICD-10-CM

## 2023-04-16 ENCOUNTER — Other Ambulatory Visit: Payer: Self-pay | Admitting: Family Medicine

## 2023-04-16 DIAGNOSIS — I700 Atherosclerosis of aorta: Secondary | ICD-10-CM

## 2023-04-16 DIAGNOSIS — I2089 Other forms of angina pectoris: Secondary | ICD-10-CM

## 2023-04-20 ENCOUNTER — Ambulatory Visit
Admission: RE | Admit: 2023-04-20 | Discharge: 2023-04-20 | Disposition: A | Discharge status: Home / Self Care | Payer: Medicare Other | Source: Ambulatory Visit | Attending: Family Medicine | Admitting: Family Medicine

## 2023-04-20 DIAGNOSIS — Z136 Encounter for screening for cardiovascular disorders: Secondary | ICD-10-CM | POA: Insufficient documentation

## 2023-04-20 DIAGNOSIS — F17211 Nicotine dependence, cigarettes, in remission: Secondary | ICD-10-CM | POA: Diagnosis not present

## 2023-04-20 DIAGNOSIS — I700 Atherosclerosis of aorta: Secondary | ICD-10-CM | POA: Diagnosis not present

## 2023-04-20 DIAGNOSIS — Z720 Tobacco use: Secondary | ICD-10-CM

## 2023-04-20 DIAGNOSIS — Z0000 Encounter for general adult medical examination without abnormal findings: Secondary | ICD-10-CM

## 2023-04-20 DIAGNOSIS — I2510 Atherosclerotic heart disease of native coronary artery without angina pectoris: Secondary | ICD-10-CM | POA: Insufficient documentation

## 2023-04-25 NOTE — Progress Notes (Signed)
 Personal history of tobacco use, CAD Screening for AAA Where do I document?

## 2023-05-04 ENCOUNTER — Ambulatory Visit (INDEPENDENT_AMBULATORY_CARE_PROVIDER_SITE_OTHER): Payer: Medicare Other | Admitting: Family Medicine

## 2023-05-04 ENCOUNTER — Encounter: Payer: Self-pay | Admitting: Family Medicine

## 2023-05-04 VITALS — BP 128/70 | HR 79 | Temp 97.7°F | Resp 16 | Ht 71.0 in | Wt 164.3 lb

## 2023-05-04 DIAGNOSIS — E538 Deficiency of other specified B group vitamins: Secondary | ICD-10-CM

## 2023-05-04 DIAGNOSIS — G63 Polyneuropathy in diseases classified elsewhere: Secondary | ICD-10-CM

## 2023-05-04 DIAGNOSIS — I2510 Atherosclerotic heart disease of native coronary artery without angina pectoris: Secondary | ICD-10-CM

## 2023-05-04 DIAGNOSIS — I700 Atherosclerosis of aorta: Secondary | ICD-10-CM

## 2023-05-04 DIAGNOSIS — Z8711 Personal history of peptic ulcer disease: Secondary | ICD-10-CM

## 2023-05-04 DIAGNOSIS — F1021 Alcohol dependence, in remission: Secondary | ICD-10-CM | POA: Diagnosis not present

## 2023-05-04 DIAGNOSIS — N1831 Chronic kidney disease, stage 3a: Secondary | ICD-10-CM

## 2023-05-04 DIAGNOSIS — J432 Centrilobular emphysema: Secondary | ICD-10-CM

## 2023-05-04 DIAGNOSIS — K219 Gastro-esophageal reflux disease without esophagitis: Secondary | ICD-10-CM

## 2023-05-04 DIAGNOSIS — I10 Essential (primary) hypertension: Secondary | ICD-10-CM

## 2023-05-04 DIAGNOSIS — I2089 Other forms of angina pectoris: Secondary | ICD-10-CM

## 2023-05-04 MED ORDER — ATORVASTATIN CALCIUM 40 MG PO TABS
40.0000 mg | ORAL_TABLET | Freq: Every day | ORAL | 3 refills | Status: AC
Start: 2023-05-04 — End: ?

## 2023-05-04 NOTE — Progress Notes (Signed)
Name: Kevin Roberts   MRN: [id number]    DOB: 10-25-1945   Date:05/04/2023       Progress Note  Subjective  Chief Complaint  Chief Complaint  Patient presents with   Medical Management of Chronic Issues   HPI   COPD Moderate/Emphysema : he quit smoking 10/2022. He is still using Anoro. He started taking Chantix 12/2021 and is still taking but down to one a day, still craves cigarettes in the morning and at night but is sucking on a lolly pop instead . He used to smoke 1.5 pack day for many years. Last CT lung was done 10/2022 and showed emphysema. He has SOB with mild to moderate activity - like dragging trash can to the curb, no wheezing or cough.   History of Alcoholism: he used to drink before retirement - about 2 shots at night, however when he retired at age 28 he started to drink about 1/5 of liquor every two days. He states he stopped drinking because he fell on his face, developed a gastric ulcer and B12 deficiency. He is doing well since he stopped drinking around 2010. He still has tingling on his legs He takes gabapentin and symptoms are stable, pain resolved years ago.  He is able to button his shirts .    GERD and history of gastric ulcer:/hiatal hernia   He states symptoms controlled as long as he takes a PPI, no heartburn or regurgitation. He was seen by Dr. Birdie Sons and had EGD in August 2019. He has been doing well on lower dose of pantoprazole 20 mg. Episodes are sporadic and usually triggered by diet   Hyperlipidemia: taking Atorvastatin and denies side effects. Taking aspirin daily . LDL has been at goal . Continue current regiment and recheck labs next visit   CAD/Angina : found on CT chest 3 vessel disease, he denies any recent episodes of chest pain. He is still taking  aspirin and statin therapy, he is off beta blocker due to bradycardia. Cardiac cath in 2017 showed mild to moderate non-obstructive disease and is only on medical management. Unable to tolerate Imdur - it  caused hypotension .    B12 neuropathy: he is taking B12 supplementation and also takes Neurontin ,, continue current regiment    Atherosclerosis abdominal aorta: on medical management, aspirin,  and statin therapy. He quit smoking 2024   CKI: stage IIIa  he avoiding NSAID's, no pruritis, good urine output , GFR has been stable . We will recheck labs next visit    Bilateral gynecomastia mild on mammogram done 02/2023    BPH: under the care of Urologist .    Bradycardia: off beta blocker and heart rate is normal today , under the care of cardiologist    Patient Active Problem List   Diagnosis Date Noted   History of colon polyps 01/09/2023   Polyp of sigmoid colon 01/09/2023   Centrilobular emphysema (HCC) 01/26/2022   Bronchiectasis without complication (HCC) 02/17/2020   History of gastric ulcer 02/17/2020   Hiatal hernia 01/22/2018   Abdominal pain, chronic, right lower quadrant 10/26/2017   Esophageal diverticulum 10/26/2017   History of alcoholism (HCC) 01/18/2016   Gynecomastia 10/14/2015   Abnormal nuclear stress test    Anginal equivalent (HCC)    Atherosclerosis of aorta (HCC) 04/15/2015   BPH with obstruction/lower urinary tract symptoms 02/03/2015   Renal cyst, left 10/13/2014   Tobacco use    B12 neuropathy (HCC) 10/04/2014   CAD, multiple vessel 10/04/2014  Benign essential HTN 10/04/2014   Chronic kidney disease (CKD), stage III (moderate) (HCC) 10/04/2014   Dyslipidemia 10/04/2014   COPD, moderate (HCC) 10/04/2014   Gastro-esophageal reflux disease without esophagitis 10/04/2014   Enlarged prostate 10/04/2014   Vitamin D deficiency 10/04/2014    Past Surgical History:  Procedure Laterality Date   CARDIAC CATHETERIZATION N/A 07/02/2015   Procedure: Left Heart Cath and Coronary Angiography;  Surgeon: Iran Ouch, MD;  Location: [facility] INVASIVE CV LAB;  Service: Cardiovascular;  Laterality: N/A;   COLONOSCOPY  2011   JWB   COLONOSCOPY WITH PROPOFOL N/A  11/26/2019   Procedure: COLONOSCOPY WITH PROPOFOL;  Surgeon: Earline Mayotte, MD;  Location: [facility] ENDOSCOPY;  Service: Endoscopy;  Laterality: N/A;   COLONOSCOPY WITH PROPOFOL N/A 01/09/2023   Procedure: COLONOSCOPY WITH PROPOFOL;  Surgeon: Midge Minium, MD;  Location: [facility] ENDOSCOPY;  Service: Endoscopy;  Laterality: N/A;   ESOPHAGOGASTRODUODENOSCOPY (EGD) WITH PROPOFOL N/A 11/14/2017   Procedure: ESOPHAGOGASTRODUODENOSCOPY (EGD) WITH PROPOFOL;  Surgeon: Earline Mayotte, MD;  Location: [facility] ENDOSCOPY;  Service: Endoscopy;  Laterality: N/A;   POLYPECTOMY  01/09/2023   Procedure: POLYPECTOMY;  Surgeon: Midge Minium, MD;  Location: [facility] ENDOSCOPY;  Service: Endoscopy;;   throat biopsy     lump removed    Family History  Problem Relation Age of Onset   Hypertension Mother    CVA Mother    Cancer Father        Lung   Mental illness Brother        1-Schizophrenia   GER disease Son    Healthy Sister    Heart Problems Brother    Gallbladder disease Brother    Healthy Sister    Gallbladder disease Brother    Hypertension Brother     Social History   Tobacco Use   Smoking status: Former    Current packs/day: 0.00    Average packs/day: 0.2 packs/day for 49.1 years (12.3 ttl pk-yrs)    Types: Cigarettes    Start date: 10/04/1973    Quit date: 11/02/2022    Years since quitting: 0.5   Smokeless tobacco: Never   Tobacco comments:    He is taking Chantix since Fall 2023    He finally completely quit July 2024  Substance Use Topics   Alcohol use: No    Alcohol/week: 0.0 standard drinks of alcohol     Current Outpatient Medications:    amLODipine (NORVASC) 5 MG tablet, Take 1 tablet (5 mg total) by mouth daily., Disp: 90 tablet, Rfl: 3   aspirin 81 MG tablet, Take 1 tablet by mouth daily., Disp: , Rfl:    atorvastatin (LIPITOR) 40 MG tablet, Take 1 tablet by mouth once daily, Disp: 90 tablet, Rfl: 0   Cyanocobalamin (B-12) 1000 MCG TBCR, Take 1 tablet by mouth daily., Disp: , Rfl:     gabapentin (NEURONTIN) 300 MG capsule, Take 1 capsule (300 mg total) by mouth 2 (two) times daily., Disp: 180 capsule, Rfl: 3   MULTIPLE VITAMIN PO, Take 1 tablet by mouth daily., Disp: , Rfl:    nitroGLYCERIN (NITROSTAT) 0.4 MG SL tablet, DISSOLVE ONE TABLET UNDER THE TONGUE EVERY 5 MINUTES AS NEEDED FOR CHEST PAIN.  DO NOT EXCEED A TOTAL OF 3 DOSES IN 15 MINUTES, Disp: 25 tablet, Rfl: 0   pantoprazole (PROTONIX) 20 MG tablet, Take 1 tablet (20 mg total) by mouth daily., Disp: 90 tablet, Rfl: 1   tamsulosin (FLOMAX) 0.4 MG CAPS capsule, Take 1 capsule (0.4 mg total) by mouth  daily., Disp: 90 capsule, Rfl: 3   umeclidinium-vilanterol (ANORO ELLIPTA) 62.5-25 MCG/ACT AEPB, Inhale 1 puff by mouth once daily, Disp: 180 each, Rfl: 1   varenicline (CHANTIX) 1 MG tablet, Take 1 tablet by mouth twice daily, Disp: 180 tablet, Rfl: 0   Vitamin D, Cholecalciferol, 1000 UNITS CAPS, Take 1 tablet by mouth daily., Disp: , Rfl:   No Known Allergies  I personally reviewed active problem list, medication list, allergies with the patient/caregiver today.   ROS  Ten systems reviewed and is negative except as mentioned in HPI    Objective  Vitals:   05/04/23 0837  BP: 128/70  Pulse: 79  Resp: 16  Temp: 97.7 F (36.5 C)  TempSrc: Oral  SpO2: 99%  Weight: 164 lb 4.8 oz (74.5 kg)  Height: 5\' 11"  (1.803 m)    Body mass index is 22.92 kg/m.  Physical Exam  Constitutional: Patient appears well-developed and well-nourished.  No distress.  HEENT: head atraumatic, normocephalic, pupils equal and reactive to light, , neck supple, throat within normal limits Cardiovascular: Normal rate, regular rhythm and normal heart sounds.  No murmur heard. No BLE edema. Pulmonary/Chest: Effort normal and breath sounds normal. No respiratory distress. Abdominal: Soft.  There is no tenderness. Psychiatric: Patient has a normal mood and affect. behavior is normal. Judgment and thought content normal.   Recent  Results (from the past 2160 hours)  PSA     Status: None   Collection Time: 02/08/23  8:51 AM  Result Value Ref Range   Prostate Specific Ag, Serum 2.5 0.0 - 4.0 ng/mL    Comment: Roche ECLIA methodology. According to the American Urological Association, Serum PSA should decrease and remain at undetectable levels after radical prostatectomy. The AUA defines biochemical recurrence as an initial PSA value 0.2 ng/mL or greater followed by a subsequent confirmatory PSA value 0.2 ng/mL or greater. Values obtained with different assay methods or kits cannot be used interchangeably. Results cannot be interpreted as absolute evidence of the presence or absence of malignant disease.   Bladder Scan (Post Void Residual) in office     Status: None   Collection Time: 02/13/23  9:19 AM  Result Value Ref Range   Scan Result 10     Diabetic Foot Exam:     PHQ2/9:    05/04/2023    8:31 AM 01/31/2023    8:37 AM 10/24/2022    8:24 AM 08/01/2022    8:51 AM 01/26/2022    8:48 AM  Depression screen PHQ 2/9  Decreased Interest 0 0 0 0 0  Down, Depressed, Hopeless 0 0 0 0 0  PHQ - 2 Score 0 0 0 0 0  Altered sleeping 0 0 0 0 0  Tired, decreased energy 0 0 0 0 1  Change in appetite 0 0 0 0 0  Feeling bad or failure about yourself  0 0 0 0 0  Trouble concentrating 0 0 0 0 0  Moving slowly or fidgety/restless 0 0 0 0 0  Suicidal thoughts 0 0 0 0 0  PHQ-9 Score 0 0 0 0 1  Difficult doing work/chores Not difficult at all        phq 9 is negative  Fall Risk:    05/04/2023    8:31 AM 01/31/2023    8:37 AM 10/24/2022    8:24 AM 08/01/2022    8:51 AM 01/26/2022    8:48 AM  Fall Risk   Falls in the past year? 0 0 0  0 0  Number falls in past yr: 0  0 0 0  Injury with Fall? 0  0 0 0  Risk for fall due to : No Fall Risks No Fall Risks No Fall Risks No Fall Risks No Fall Risks  Follow up Falls prevention discussed;Education provided;Falls evaluation completed Falls prevention discussed Falls  prevention discussed Falls prevention discussed Falls prevention discussed     Assessment & Plan  1. Atherosclerosis of aorta (HCC) (Primary)  - atorvastatin (LIPITOR) 40 MG tablet; Take 1 tablet (40 mg total) by mouth daily.  Dispense: 90 tablet; Refill: 3  2. Centrilobular emphysema (HCC)  Getting CT done   3. History of alcoholism (HCC)  Doing well, in remission  4. Stage 3a chronic kidney disease (HCC)  Recheck labs next visit   5. B12 neuropathy (HCC)  Doing well at this time  6. Anginal equivalent (HCC)  Doing well   7. History of gastric ulcer  Doing well   8. GERD without esophagitis  Doing well  9. CAD, multiple vessel  - atorvastatin (LIPITOR) 40 MG tablet; Take 1 tablet (40 mg total) by mouth daily.  Dispense: 90 tablet; Refill: 3  10. Hypertension, benign   BP is at goal

## 2023-06-05 DIAGNOSIS — Z23 Encounter for immunization: Secondary | ICD-10-CM | POA: Diagnosis not present

## 2023-08-03 ENCOUNTER — Encounter: Payer: Self-pay | Admitting: Family Medicine

## 2023-08-03 ENCOUNTER — Ambulatory Visit (INDEPENDENT_AMBULATORY_CARE_PROVIDER_SITE_OTHER): Payer: Self-pay | Admitting: Family Medicine

## 2023-08-03 VITALS — BP 134/72 | HR 80 | Resp 16 | Ht 71.0 in | Wt 168.4 lb

## 2023-08-03 DIAGNOSIS — N1831 Chronic kidney disease, stage 3a: Secondary | ICD-10-CM

## 2023-08-03 DIAGNOSIS — I700 Atherosclerosis of aorta: Secondary | ICD-10-CM

## 2023-08-03 DIAGNOSIS — J432 Centrilobular emphysema: Secondary | ICD-10-CM | POA: Diagnosis not present

## 2023-08-03 DIAGNOSIS — F1021 Alcohol dependence, in remission: Secondary | ICD-10-CM

## 2023-08-03 DIAGNOSIS — K219 Gastro-esophageal reflux disease without esophagitis: Secondary | ICD-10-CM

## 2023-08-03 DIAGNOSIS — I2089 Other forms of angina pectoris: Secondary | ICD-10-CM

## 2023-08-03 DIAGNOSIS — N62 Hypertrophy of breast: Secondary | ICD-10-CM

## 2023-08-03 DIAGNOSIS — I25119 Atherosclerotic heart disease of native coronary artery with unspecified angina pectoris: Secondary | ICD-10-CM

## 2023-08-03 DIAGNOSIS — I10 Essential (primary) hypertension: Secondary | ICD-10-CM

## 2023-08-03 DIAGNOSIS — Z8711 Personal history of peptic ulcer disease: Secondary | ICD-10-CM

## 2023-08-03 DIAGNOSIS — G63 Polyneuropathy in diseases classified elsewhere: Secondary | ICD-10-CM

## 2023-08-03 DIAGNOSIS — E538 Deficiency of other specified B group vitamins: Secondary | ICD-10-CM

## 2023-08-03 DIAGNOSIS — I2510 Atherosclerotic heart disease of native coronary artery without angina pectoris: Secondary | ICD-10-CM

## 2023-08-03 MED ORDER — ANORO ELLIPTA 62.5-25 MCG/ACT IN AEPB: INHALATION_SPRAY | RESPIRATORY_TRACT | 3 refills | Status: AC

## 2023-08-03 MED ORDER — GABAPENTIN 300 MG PO CAPS
300.0000 mg | ORAL_CAPSULE | Freq: Two times a day (BID) | ORAL | 3 refills | Status: AC
Start: 2023-08-03 — End: ?

## 2023-08-03 MED ORDER — PANTOPRAZOLE SODIUM 20 MG PO TBEC: 20.0000 mg | DELAYED_RELEASE_TABLET | Freq: Every day | ORAL | 3 refills | Status: AC

## 2023-08-03 NOTE — Addendum Note (Signed)
 Addended by: Larita Pluck on: 08/03/2023 09:35 AM   Modules accepted: Level of Service

## 2023-08-03 NOTE — Progress Notes (Signed)
 Name: Kevin Roberts   MRN: [id number]    DOB: 06/17/1945   Date:08/03/2023       Progress Note  Subjective  Chief Complaint  Chief Complaint  Patient presents with   Medical Management of Chronic Issues   Discussed the use of AI scribe software for clinical note transcription with the patient, who gave verbal consent to proceed.  History of Present Illness Kevin Roberts is a 78 year old male with emphysema and coronary artery disease who presents for routine follow-up and blood work.  He has emphysema and quit smoking in July 2024. He uses Anoro for his breathing, which he takes in the evening around 7 PM. He feels congested in the chest some mornings but does not experience a cough or shortness of breath at night. He wakes up at 3 AM and takes a nap from 6 to 8 AM due to his grandchild's schedule. He does not report any significant breathing issues since quitting smoking.  He has coronary artery disease and experiences occasional chest tightness and pain, although he does not keep track of the frequency. He has not used nitroglycerin  recently and reports that the chest pain is not as severe as when he was smoking. He is on aspirin  and atorvastatin  and has NTG at home to take prn for angina ( under the care of cardiologist) .  He has chronic kidney disease stage 3A with a GFR of 52, improved from 46 two years ago. He does not like drinking water denies using NSAID's  He has a history of alcoholism which led to B12 neuropathy and gastritis. He quit drinking around 2010. He reports occasional indigestion and takes pantoprazole  20 mg daily. He has a history of gastric ulcer which has healed since he stopped drinking.  He experiences B12 neuropathy with occasional tingling and numbness in his feet, which has improved. He takes gabapentin  twice daily without side effects. His B12 levels were previously low but have improved, he is taking SL B12  He has  vitamin D  deficiency and takes  over-the-counter vitamin D  daily. He is also on amlodipine  for high blood pressure, which is well-controlled.   HTN is under control with norvasc  5 mg, denies side effects  Patient Active Problem List   Diagnosis Date Noted   History of colon polyps 01/09/2023   Polyp of sigmoid colon 01/09/2023   Centrilobular emphysema (HCC) 01/26/2022   Bronchiectasis without complication (HCC) 02/17/2020   History of gastric ulcer 02/17/2020   Hiatal hernia 01/22/2018   Abdominal pain, chronic, right lower quadrant 10/26/2017   Esophageal diverticulum 10/26/2017   History of alcoholism (HCC) 01/18/2016   Gynecomastia 10/14/2015   Abnormal nuclear stress test    Anginal equivalent (HCC)    Atherosclerosis of aorta (HCC) 04/15/2015   BPH with obstruction/lower urinary tract symptoms 02/03/2015   Renal cyst, left 10/13/2014   Tobacco use    B12 neuropathy (HCC) 10/04/2014   CAD, multiple vessel 10/04/2014   Benign essential HTN 10/04/2014   Chronic kidney disease (CKD), stage III (moderate) (HCC) 10/04/2014   Dyslipidemia 10/04/2014   COPD, moderate (HCC) 10/04/2014   Gastro-esophageal reflux disease without esophagitis 10/04/2014   Enlarged prostate 10/04/2014   Vitamin D  deficiency 10/04/2014    Past Surgical History:  Procedure Laterality Date   CARDIAC CATHETERIZATION N/A 07/02/2015   Procedure: Left Heart Cath and Coronary Angiography;  Surgeon: Wenona Hamilton, MD;  Location: [facility] INVASIVE CV LAB;  Service: Cardiovascular;  Laterality: N/A;  COLONOSCOPY  2011   JWB   COLONOSCOPY WITH PROPOFOL  N/A 11/26/2019   Procedure: COLONOSCOPY WITH PROPOFOL ;  Surgeon: Marshall Skeeter, MD;  Location: [facility] ENDOSCOPY;  Service: Endoscopy;  Laterality: N/A;   COLONOSCOPY WITH PROPOFOL  N/A 01/09/2023   Procedure: COLONOSCOPY WITH PROPOFOL ;  Surgeon: Marnee Sink, MD;  Location: [facility] ENDOSCOPY;  Service: Endoscopy;  Laterality: N/A;   ESOPHAGOGASTRODUODENOSCOPY (EGD) WITH PROPOFOL  N/A 11/14/2017    Procedure: ESOPHAGOGASTRODUODENOSCOPY (EGD) WITH PROPOFOL ;  Surgeon: Marshall Skeeter, MD;  Location: [facility] ENDOSCOPY;  Service: Endoscopy;  Laterality: N/A;   POLYPECTOMY  01/09/2023   Procedure: POLYPECTOMY;  Surgeon: Marnee Sink, MD;  Location: [facility] ENDOSCOPY;  Service: Endoscopy;;   throat biopsy     lump removed    Family History  Problem Relation Age of Onset   Hypertension Mother    CVA Mother    Cancer Father        Lung   Mental illness Brother        1-Schizophrenia   GER disease Son    Healthy Sister    Heart Problems Brother    Gallbladder disease Brother    Healthy Sister    Gallbladder disease Brother    Hypertension Brother     Social History   Tobacco Use   Smoking status: Former    Current packs/day: 0.00    Average packs/day: 0.2 packs/day for 49.1 years (12.3 ttl pk-yrs)    Types: Cigarettes    Start date: 10/04/1973    Quit date: 11/02/2022    Years since quitting: 0.7   Smokeless tobacco: Never   Tobacco comments:    He is taking Chantix  since Fall 2023    He finally completely quit July 2024  Substance Use Topics   Alcohol use: No    Alcohol/week: 0.0 standard drinks of alcohol     Current Outpatient Medications:    aspirin  81 MG tablet, Take 1 tablet by mouth daily., Disp: , Rfl:    atorvastatin  (LIPITOR) 40 MG tablet, Take 1 tablet (40 mg total) by mouth daily., Disp: 90 tablet, Rfl: 3   Cyanocobalamin  (B-12) 1000 MCG TBCR, Take 1 tablet by mouth daily., Disp: , Rfl:    gabapentin  (NEURONTIN ) 300 MG capsule, Take 1 capsule (300 mg total) by mouth 2 (two) times daily., Disp: 180 capsule, Rfl: 3   MULTIPLE VITAMIN PO, Take 1 tablet by mouth daily., Disp: , Rfl:    nitroGLYCERIN  (NITROSTAT ) 0.4 MG SL tablet, DISSOLVE ONE TABLET UNDER THE TONGUE EVERY 5 MINUTES AS NEEDED FOR CHEST PAIN.  DO NOT EXCEED A TOTAL OF 3 DOSES IN 15 MINUTES, Disp: 25 tablet, Rfl: 0   pantoprazole  (PROTONIX ) 20 MG tablet, Take 1 tablet (20 mg total) by mouth daily., Disp:  90 tablet, Rfl: 1   tamsulosin  (FLOMAX ) 0.4 MG CAPS capsule, Take 1 capsule (0.4 mg total) by mouth daily., Disp: 90 capsule, Rfl: 3   umeclidinium-vilanterol (ANORO ELLIPTA ) 62.5-25 MCG/ACT AEPB, Inhale 1 puff by mouth once daily, Disp: 180 each, Rfl: 1   varenicline  (CHANTIX ) 1 MG tablet, Take 1 tablet by mouth twice daily, Disp: 180 tablet, Rfl: 0   Vitamin D , Cholecalciferol, 1000 UNITS CAPS, Take 1 tablet by mouth daily., Disp: , Rfl:    amLODipine  (NORVASC ) 5 MG tablet, Take 1 tablet (5 mg total) by mouth daily., Disp: 90 tablet, Rfl: 3  No Known Allergies  I personally reviewed active problem list, medication list, allergies, family history with the patient/caregiver today.   ROS  Ten systems reviewed and is negative except as mentioned in HPI   Objective Physical Exam  CONSTITUTIONAL: Patient appears well-developed and well-nourished. No distress. HEENT: Head atraumatic, normocephalic, neck supple. CARDIOVASCULAR: Normal rate, regular rhythm and normal heart sounds. No murmur heard. No BLE edema. PULMONARY: Effort normal and breath sounds normal. No respiratory distress. ABDOMINAL: There is no tenderness or distention. MUSCULOSKELETAL: Normal gait. Without gross motor or sensory deficit. PSYCHIATRIC: Patient has a normal mood and affect. Behavior is normal. Judgment and thought content normal.   Vitals:   08/03/23 0841  BP: 134/72  Pulse: 80  Resp: 16  SpO2: 98%  Weight: 168 lb 6.4 oz (76.4 kg)  Height: 5\' 11"  (1.803 m)    Body mass index is 23.49 kg/m.   PHQ2/9:    08/03/2023    8:40 AM 05/04/2023    8:31 AM 01/31/2023    8:37 AM 10/24/2022    8:24 AM 08/01/2022    8:51 AM  Depression screen PHQ 2/9  Decreased Interest 0 0 0 0 0  Down, Depressed, Hopeless 0 0 0 0 0  PHQ - 2 Score 0 0 0 0 0  Altered sleeping 0 0 0 0 0  Tired, decreased energy 0 0 0 0 0  Change in appetite 0 0 0 0 0  Feeling bad or failure about yourself  0 0 0 0 0  Trouble concentrating 0  0 0 0 0  Moving slowly or fidgety/restless 0 0 0 0 0  Suicidal thoughts 0 0 0 0 0  PHQ-9 Score 0 0 0 0 0  Difficult doing work/chores Not difficult at all Not difficult at all       phq 9 is negative  Fall Risk:    08/03/2023    8:38 AM 05/04/2023    8:31 AM 01/31/2023    8:37 AM 10/24/2022    8:24 AM 08/01/2022    8:51 AM  Fall Risk   Falls in the past year? 0 0 0 0 0  Number falls in past yr: 0 0  0 0  Injury with Fall? 0 0  0 0  Risk for fall due to : No Fall Risks No Fall Risks No Fall Risks No Fall Risks No Fall Risks  Follow up Falls prevention discussed;Education provided;Falls evaluation completed Falls prevention discussed;Education provided;Falls evaluation completed Falls prevention discussed Falls prevention discussed Falls prevention discussed    Assessment & Plan Coronary artery disease with angina Intermittent chest pain, less severe since smoking cessation. No recent nitroglycerin  use. Last cardiology visit in October showed slightly elevated blood pressure, but current readings are good. - Continue atorvastatin  and aspirin . - Use nitroglycerin  as needed for chest pain. - Call 911 if chest pain persists beyond 10 minutes after nitroglycerin  use.  Hypertension Blood pressure is well-controlled with amlodipine , maintaining normal range. - Continue amlodipine  5 mg daily. - Monitor blood pressure regularly.  Chronic kidney disease, stage 3A GFR improved from 46 to 52, indicating well-managed kidney function. Emphasized blood pressure control and avoiding NSAIDs to protect kidney function. - Avoid NSAIDs such as Aleve and Motrin. - Ensure adequate hydration. - Monitor kidney function regularly.  Emphysema Smoking cessation in July 2024 reduced symptoms. Continues Anoro for breathing. No nocturnal symptoms, but morning congestion persists. Last CT showed emphysema damage and nodules, with follow-up CT due in July. Discussed persistent symptoms despite smoking  cessation. - Continue Anoro as prescribed. - Schedule follow-up CT scan in July. - Referral to pharmacy assistance program for  Anoro Special educational needs teacher.  B12 deficiency neuropathy Neuropathy symptoms improved with gabapentin . B12 levels improved with supplementation. - Continue gabapentin  twice daily. - Check B12 levels regularly. - Continue sublingual B12 supplementation.  Hiatal hernia Recent indigestion, possibly related to hiatal hernia, managed with pantoprazole . - Continue pantoprazole  20 mg daily. - Consider further evaluation if symptoms persist.  History of alcoholism  Abstinent since 2010. Alcoholism contributed to B12 deficiency and gastric ulcer, both managed. - Continue to abstain from alcohol.

## 2023-08-04 LAB — LIPID PANEL
Cholesterol: 113 mg/dL (ref <200)
HDL: 60 mg/dL (ref >=40)
LDL Cholesterol (Calc): 38 mg/dL
Non-HDL Cholesterol (Calc): 53 mg/dL (ref <130)
Total CHOL/HDL Ratio: 1.9 (calc) (ref <5.0)
Triglycerides: 74 mg/dL (ref <150)

## 2023-08-04 LAB — COMPREHENSIVE METABOLIC PANEL WITH GFR
AG Ratio: 1.6 (calc) (ref 1.0–2.5)
ALT: 16 U/L (ref 9–46)
AST: 22 U/L (ref 10–35)
Albumin: 4.4 g/dL (ref 3.6–5.1)
Alkaline phosphatase (APISO): 54 U/L (ref 35–144)
BUN/Creatinine Ratio: 9 (calc) (ref 6–22)
BUN: 13 mg/dL (ref 7–25)
CO2: 29 mmol/L (ref 20–32)
Calcium: 10.1 mg/dL (ref 8.6–10.3)
Chloride: 105 mmol/L (ref 98–110)
Creat: 1.38 mg/dL — ABNORMAL HIGH (ref 0.70–1.28)
Globulin: 2.7 g/dL (ref 1.9–3.7)
Glucose, Bld: 92 mg/dL (ref 65–99)
Potassium: 4.4 mmol/L (ref 3.5–5.3)
Sodium: 141 mmol/L (ref 135–146)
Total Bilirubin: 1.1 mg/dL (ref 0.2–1.2)
Total Protein: 7.1 g/dL (ref 6.1–8.1)
eGFR: 53 mL/min/{1.73_m2} — ABNORMAL LOW (ref >=60)

## 2023-08-04 LAB — CBC WITH DIFFERENTIAL/PLATELET
Absolute Lymphocytes: 1704 {cells}/uL (ref 850–3900)
Absolute Monocytes: 710 {cells}/uL (ref 200–950)
Basophils Absolute: 50 {cells}/uL (ref 0–200)
Basophils Relative: 0.7 %
Eosinophils Absolute: 156 {cells}/uL (ref 15–500)
Eosinophils Relative: 2.2 %
HCT: 43.4 % (ref 38.5–50.0)
Hemoglobin: 14.3 g/dL (ref 13.2–17.1)
MCH: 29.5 pg (ref 27.0–33.0)
MCHC: 32.9 g/dL (ref 32.0–36.0)
MCV: 89.7 fL (ref 80.0–100.0)
MPV: 12.1 fL (ref 7.5–12.5)
Monocytes Relative: 10 %
Neutro Abs: 4480 {cells}/uL (ref 1500–7800)
Neutrophils Relative %: 63.1 %
Platelets: 194 10*3/uL (ref 140–400)
RBC: 4.84 10*6/uL (ref 4.20–5.80)
RDW: 12.3 % (ref 11.0–15.0)
Total Lymphocyte: 24 %
WBC: 7.1 10*3/uL (ref 3.8–10.8)

## 2023-08-04 LAB — VITAMIN D 25 HYDROXY (VIT D DEFICIENCY, FRACTURES): Vit D, 25-Hydroxy: 48 ng/mL (ref 30–100)

## 2023-08-08 ENCOUNTER — Telehealth: Payer: Self-pay | Admitting: Pharmacist

## 2023-08-08 ENCOUNTER — Telehealth: Payer: Self-pay

## 2023-08-08 NOTE — Progress Notes (Signed)
   Outreach Note  08/08/2023 Name: PRINCEMICHAEL GRAVIER MRN: [id number] DOB: 28-Sep-1945  Referred by: Sowles, Krichna, MD Reason for referral : No chief complaint on file.  Receive referral from PCP requesting outreach to patient for assistance with medication access for Anoro inhaler  Was unable to reach patient via telephone today and have left HIPAA compliant message asking patient to return my call.   Follow Up Plan: Further attempts will be made by Care Guide team to reach patient as referred by PCP  Arthur Lash, PharmD, [facility] [phone]

## 2023-08-08 NOTE — Progress Notes (Signed)
 Care Guide Pharmacy Note  08/08/2023 Name: ANDRON BEATTIE MRN: [id number] DOB: 02/12/1946  Referred By: Sowles, Krichna, MD Reason for referral: Complex Care Management (Outreach to schedule with pharm d )   Kevin Roberts is a 78 y.o. year old male who is a primary care patient of Sowles, Krichna, MD.  [facility] was referred to the pharmacist for assistance related to:  emphysema  An unsuccessful telephone outreach was attempted today to contact the patient who was referred to the pharmacy team for assistance with medication assistance. Additional attempts will be made to contact the patient.  Lenton Rail , RMA     [facility]  [facility] Guide  Direct Dial: [phone]  Website: [email or web address]

## 2023-08-14 NOTE — Progress Notes (Signed)
 Care Guide Pharmacy Note  08/14/2023 Name: Kevin Roberts MRN: [id number] DOB: Jan 01, 1946  Referred By: Sowles, Krichna, MD Reason for referral: Complex Care Management (Outreach to schedule with pharm d )   Kevin Roberts is a 78 y.o. year old male who is a primary care patient of Sowles, Krichna, MD.  Kevin Roberts was referred to the pharmacist for assistance related to:  Emphysema   Successful contact was made with the patient to discuss pharmacy services including being ready for the pharmacist to call at least 5 minutes before the scheduled appointment time and to have medication bottles and any blood pressure readings ready for review. The patient agreed to meet with the pharmacist via telephone visit on (date/time). 08/31/2023 Kevin Roberts , RMA     [place]  [facility] Guide  Direct Dial: [phone]  Website: [email or web address]

## 2023-08-31 ENCOUNTER — Other Ambulatory Visit: Payer: Self-pay | Admitting: Pharmacist

## 2023-08-31 DIAGNOSIS — J449 Chronic obstructive pulmonary disease, unspecified: Secondary | ICD-10-CM

## 2023-08-31 NOTE — Patient Instructions (Addendum)
 I look forward to speaking with you by telephone again on 09/07/2023 at 10:00 AM   Thank you!  Arthur Lash, PharmD, [facility] [phone]

## 2023-08-31 NOTE — Progress Notes (Signed)
   08/31/2023 Name: Kevin Roberts MRN: [id number] DOB: Sep 30, 1945  Chief Complaint  Patient presents with   Medication Assistance    Kevin Roberts is a 78 y.o. year old male who presented for a telephone visit.   They were referred to the pharmacist by their PCP for assistance in managing medication access for his Anoro inhaler   Subjective:  Care Team: Primary Care Provider: Sowles, Krichna, MD ; Next Scheduled Visit: 02/06/2024 Cardiologist: Wenona Hamilton, MD   Medication Access/Adherence  Current Pharmacy:  [facility] [address]), [place] - [address] [place]) [place] [address] Phone: [phone] Fax: [phone]   Patient reports affordability concerns with their medications: Yes  Patient reports access/transportation concerns to their pharmacy: No  Patient reports adherence concerns with their medications:  No    Reports copayment for Anoro is difficult to afford through his [employer] prescription coverage. Reports last paid $141 for 3 month supply - Patient currently has 2 inhalers remaining     Objective:  Current Outpatient Medications on File Prior to Visit  Medication Sig Dispense Refill   amLODipine  (NORVASC ) 5 MG tablet Take 1 tablet (5 mg total) by mouth daily. 90 tablet 3   aspirin  81 MG tablet Take 1 tablet by mouth daily.     atorvastatin  (LIPITOR) 40 MG tablet Take 1 tablet (40 mg total) by mouth daily. 90 tablet 3   Cyanocobalamin  (B-12) 1000 MCG TBCR Take 1 tablet by mouth daily.     gabapentin  (NEURONTIN ) 300 MG capsule Take 1 capsule (300 mg total) by mouth 2 (two) times daily. 180 capsule 3   MULTIPLE VITAMIN PO Take 1 tablet by mouth daily.     nitroGLYCERIN  (NITROSTAT ) 0.4 MG SL tablet DISSOLVE ONE TABLET UNDER THE TONGUE EVERY 5 MINUTES AS NEEDED FOR CHEST PAIN.  DO NOT EXCEED A TOTAL OF 3 DOSES IN 15 MINUTES 25 tablet 0   pantoprazole  (PROTONIX ) 20 MG tablet Take 1  tablet (20 mg total) by mouth daily. 90 tablet 3   tamsulosin  (FLOMAX ) 0.4 MG CAPS capsule Take 1 capsule (0.4 mg total) by mouth daily. 90 capsule 3   umeclidinium-vilanterol (ANORO ELLIPTA ) 62.5-25 MCG/ACT AEPB Inhale 1 puff by mouth once daily 180 each 3   varenicline  (CHANTIX ) 1 MG tablet Take 1 tablet by mouth twice daily 180 tablet 0   Vitamin D , Cholecalciferol, 1000 UNITS CAPS Take 1 tablet by mouth daily.     No current facility-administered medications on file prior to visit.       Assessment/Plan:   Unable to determine eligibility for patient assistance programs for Anoro or therapeutic alternative today as patient needs to verify household income - Schedule appointment as requested to follow up to provide time for patient to gather income information  Encourage patient to consider contacting [employer] to receive a copy of latest medical/prescription insurance card  Follow Up Plan: Clinical Pharmacist will follow up with patient by telephone on 09/07/2023 at 10:00 AM    Arthur Lash, PharmD, [facility] [phone]

## 2023-09-07 ENCOUNTER — Other Ambulatory Visit: Payer: Self-pay | Admitting: Pharmacist

## 2023-09-07 DIAGNOSIS — J449 Chronic obstructive pulmonary disease, unspecified: Secondary | ICD-10-CM

## 2023-09-07 NOTE — Progress Notes (Signed)
 09/07/2023 Name: Kevin Roberts MRN: [id number] DOB: 1945-06-23  Chief Complaint  Patient presents with   Medication Assistance    Kevin Roberts is a 78 y.o. year old male who presented for a telephone visit.   They were referred to the pharmacist by their PCP for assistance in managing medication access for his Anoro inhaler   Follow up today as requested to determine if patient meets criteria for patient assistance programs for Anoro or therapeutic alternative    Subjective:   Care Team: Primary Care Provider: Sowles, Krichna, MD ; Next Scheduled Visit: 02/06/2024 Cardiologist: Wenona Hamilton, MD   Medication Access/Adherence  Current Pharmacy:  [facility] [address]), [place] - [address] [place]) [place] [address] Phone: [phone] Fax: [phone]   Patient reports affordability concerns with their medications: Yes  Patient reports access/transportation concerns to their pharmacy: No  Patient reports adherence concerns with their medications:  No     Has reported copayment for Anoro is difficult to afford through his [employer] prescription coverage. Reports last paid $141 for 3 month supply  Today patient reports that his household income exceeds limit for patient assistance programs for Anoro or therapeutic alternative     Objective:  Current Outpatient Medications on File Prior to Visit  Medication Sig Dispense Refill   amLODipine  (NORVASC ) 5 MG tablet Take 1 tablet (5 mg total) by mouth daily. 90 tablet 3   aspirin  81 MG tablet Take 1 tablet by mouth daily.     atorvastatin  (LIPITOR) 40 MG tablet Take 1 tablet (40 mg total) by mouth daily. 90 tablet 3   Cyanocobalamin  (B-12) 1000 MCG TBCR Take 1 tablet by mouth daily.     gabapentin  (NEURONTIN ) 300 MG capsule Take 1 capsule (300 mg total) by mouth 2 (two) times daily. 180 capsule 3   MULTIPLE VITAMIN PO Take 1 tablet by mouth daily.      nitroGLYCERIN  (NITROSTAT ) 0.4 MG SL tablet DISSOLVE ONE TABLET UNDER THE TONGUE EVERY 5 MINUTES AS NEEDED FOR CHEST PAIN.  DO NOT EXCEED A TOTAL OF 3 DOSES IN 15 MINUTES 25 tablet 0   pantoprazole  (PROTONIX ) 20 MG tablet Take 1 tablet (20 mg total) by mouth daily. 90 tablet 3   tamsulosin  (FLOMAX ) 0.4 MG CAPS capsule Take 1 capsule (0.4 mg total) by mouth daily. 90 capsule 3   umeclidinium-vilanterol (ANORO ELLIPTA ) 62.5-25 MCG/ACT AEPB Inhale 1 puff by mouth once daily 180 each 3   varenicline  (CHANTIX ) 1 MG tablet Take 1 tablet by mouth twice daily 180 tablet 0   Vitamin D , Cholecalciferol, 1000 UNITS CAPS Take 1 tablet by mouth daily.     No current facility-administered medications on file prior to visit.       Assessment/Plan:   Based on reported income patient not eligible for patient assistance programs for Anoro or therapeutic alternative   Review alternative copayment assistance options, including grant funding. Find that The Assistance [employer] Chronic Obstructive Pulmonary Disease (COPD) - Copay grant program is currently open and based on income, patient would be eligible to apply Offer to assist patient with application or provide patient with contact info for [employer], but patient declines. Patient prefers to continue to instead pay Anoro copay through his insurance plan for now  Follow Up Plan:   Patient denies further medication questions or concerns today Provide patient with contact information for clinic pharmacist to contact if needed in future for  medication questions/concerns   Arthur Lash, PharmD, [facility] [phone]

## 2023-09-07 NOTE — Patient Instructions (Signed)
 Please feel free to reach out to me in the future if needed for medication related questions or concerns!  Arthur Lash, PharmD, [facility] [phone]

## 2023-09-20 ENCOUNTER — Other Ambulatory Visit: Payer: Self-pay | Admitting: Family Medicine

## 2023-09-20 DIAGNOSIS — J432 Centrilobular emphysema: Secondary | ICD-10-CM

## 2023-09-20 DIAGNOSIS — Z720 Tobacco use: Secondary | ICD-10-CM

## 2023-10-15 ENCOUNTER — Other Ambulatory Visit: Payer: Self-pay | Admitting: Family Medicine

## 2023-10-15 DIAGNOSIS — E538 Deficiency of other specified B group vitamins: Secondary | ICD-10-CM

## 2023-10-26 ENCOUNTER — Ambulatory Visit: Payer: Medicare Other

## 2023-11-01 ENCOUNTER — Ambulatory Visit
Admission: RE | Admit: 2023-11-01 | Discharge: 2023-11-01 | Disposition: A | Discharge status: Home / Self Care | Source: Ambulatory Visit | Attending: Acute Care | Admitting: Acute Care

## 2023-11-01 DIAGNOSIS — Z87891 Personal history of nicotine dependence: Secondary | ICD-10-CM | POA: Insufficient documentation

## 2023-11-01 DIAGNOSIS — F17210 Nicotine dependence, cigarettes, uncomplicated: Secondary | ICD-10-CM | POA: Diagnosis present

## 2023-11-01 DIAGNOSIS — Z122 Encounter for screening for malignant neoplasm of respiratory organs: Secondary | ICD-10-CM | POA: Diagnosis present

## 2023-11-09 ENCOUNTER — Other Ambulatory Visit: Payer: Self-pay

## 2023-11-09 DIAGNOSIS — Z87891 Personal history of nicotine dependence: Secondary | ICD-10-CM

## 2023-11-09 DIAGNOSIS — Z122 Encounter for screening for malignant neoplasm of respiratory organs: Secondary | ICD-10-CM

## 2023-12-27 ENCOUNTER — Encounter: Payer: Self-pay | Admitting: Urology

## 2024-01-11 ENCOUNTER — Ambulatory Visit: Payer: Self-pay

## 2024-01-28 ENCOUNTER — Other Ambulatory Visit: Payer: Self-pay | Admitting: Student

## 2024-01-29 NOTE — Telephone Encounter (Signed)
 Left voice mail

## 2024-01-29 NOTE — Telephone Encounter (Signed)
 Please contact pt for future appointment. Pt due for 12 month f/u.

## 2024-02-05 ENCOUNTER — Encounter: Payer: Self-pay | Admitting: Physician Assistant

## 2024-02-05 ENCOUNTER — Ambulatory Visit: Attending: Physician Assistant | Admitting: Physician Assistant

## 2024-02-05 VITALS — BP 140/72 | HR 48 | Ht 71.0 in | Wt 167.6 lb

## 2024-02-05 DIAGNOSIS — I10 Essential (primary) hypertension: Secondary | ICD-10-CM | POA: Diagnosis not present

## 2024-02-05 DIAGNOSIS — R001 Bradycardia, unspecified: Secondary | ICD-10-CM

## 2024-02-05 DIAGNOSIS — I440 Atrioventricular block, first degree: Secondary | ICD-10-CM | POA: Diagnosis not present

## 2024-02-05 DIAGNOSIS — I2510 Atherosclerotic heart disease of native coronary artery without angina pectoris: Secondary | ICD-10-CM | POA: Diagnosis not present

## 2024-02-05 DIAGNOSIS — Z87891 Personal history of nicotine dependence: Secondary | ICD-10-CM

## 2024-02-05 DIAGNOSIS — E785 Hyperlipidemia, unspecified: Secondary | ICD-10-CM

## 2024-02-05 NOTE — Patient Instructions (Signed)
 Medication Instructions:  Your physician recommends that you continue on your current medications as directed. Please refer to the Current Medication list given to you today.   *If you need a refill on your cardiac medications before your next appointment, please call your pharmacy*  Lab Work: None ordered at this time   Follow-Up: At [facility], you and your health needs are our priority.  As part of our continuing mission to provide you with exceptional heart care, our providers are all part of one team.  This team includes your primary Cardiologist (physician) and Advanced Practice Providers or APPs (Physician Assistants and Nurse Practitioners) who all work together to provide you with the care you need, when you need it.  Your next appointment:   1 year(s)  Provider:   You may see Deatrice Cage, MD or one of the following Advanced Practice Providers on your designated Care Team:   Lonni Meager, NP Lesley Maffucci, PA-C Bernardino Bring, PA-C Cadence Blossburg, PA-C Tylene Lunch, NP Barnie Hila, NP   We recommend signing up for the patient portal called MyChart.  Sign up information is provided on this After Visit Summary.  MyChart is used to connect with patients for Virtual Visits (Telemedicine).  Patients are able to view lab/test results, encounter notes, upcoming appointments, etc.  Non-urgent messages can be sent to your provider as well.   To learn more about what you can do with MyChart, go to [email or web address].   Other Instructions BP Log given

## 2024-02-05 NOTE — Progress Notes (Signed)
 Cardiology Office Note    Date:  02/05/2024   ID:  Kevin Roberts, DOB [birth year], MRN [id number]  PCP:  Glenard Mire, MD  Cardiologist:  Deatrice Cage, MD  Electrophysiologist:  None   Chief Complaint: Follow up  History of Present Illness:   Kevin Roberts is a 78 y.o. male with history of bradycardia, nonobstructive CAD, hypertension, hyperlipidemia, CKD III, prior tobacco abuse, COPD, BPH, and GERD who presents for follow up on CAD and bradycardia.     Patient was previously noted to have 3 vessel coronary artery calcification on chest CT in 2017, promping stress testing in 06/2015 which was abnormal with mid anterior, anteroseptal, and apical anterior ischemia. This was followed by diagnostic cath which showed heavily calcified LAD with moderate calcification of the RCA, but no significant CAD. He has since been medically managed for atypical chest pain.   In 11/2020, he reported fatigue and was started on Imdur  which was subsequently discontinued due to intolerance.  He was seen in clinic 06/2021 at which time he reported 4 episodes of chest discomfort that awakened him at night lasting up about 30 minutes and resolving with sitting up.  Symptoms seem to be related to eating spicy foods.  He preferred to defer additional ischemic evaluation at that time.   Patient was most recently seen in our office 01/2023 after elevated BP and bradycardia during a colonoscopy.  Patient was asymptomatic during his follow-up appointment.  No further testing or medication changes were indicated at that time.  Patient presents today overall doing well from a cardiac perspective.  Heart rate noted to be low at 48 bpm today.  He reports fatigue but feels as though this is related to laziness and has been stable for some time.  He stays active by taking care of his grandkids and running errands.  He does not do any structured exercise.  Denies exertional dyspnea and angina.  Denies chest pain,  lightheadedness, dizziness, palpitations, and lower extremity swelling.  Labs independently reviewed: 07/2023-BUN 13, creatinine 1.38, sodium 141, potassium 4.4, normal LFTs, Hgb 14.3, HCT 43.4, platelets 194, TC 113, HDL 60, TG 74, LDL 38  Objective   Past Medical History:  Diagnosis Date   Benign prostatic hypertrophy    Chronic kidney disease, stage III (moderate) (HCC)    COPD (chronic obstructive pulmonary disease) (HCC)    Cramps, muscle, general    Elevated ferritin    Emphysema of lung (HCC)    GERD (gastroesophageal reflux disease)    History of hypercalcemia    Hyperlipidemia    Hypertension    Incomplete bladder emptying    Nocturia    Nonobstructive CAD    a. 06/2015 MV: mid ant/antseptal/apical anterior ischemia; b. 06/2015 Cath: LM nl, LAD Ca2+, LCX min irregs, OM1/2/3 nl, RCA mod Ca2+, RPDA/RPAV/RPL1,2,3 nl. EF 55-65%.   Peripheral polyneuropathy    Rising PSA level    Tobacco use    Urgency of micturation    Vitamin B 12 deficiency     Current Medications: Current Meds  Medication Sig   amLODipine  (NORVASC ) 5 MG tablet Take 1 tablet by mouth once daily   aspirin  81 MG tablet Take 1 tablet by mouth daily.   atorvastatin  (LIPITOR) 40 MG tablet Take 1 tablet (40 mg total) by mouth daily.   Cyanocobalamin  (B-12) 1000 MCG TBCR Take 1 tablet by mouth daily.   gabapentin  (NEURONTIN ) 300 MG capsule Take 1 capsule (300 mg total) by mouth 2 (two) times  daily.   MULTIPLE VITAMIN PO Take 1 tablet by mouth daily.   nitroGLYCERIN  (NITROSTAT ) 0.4 MG SL tablet DISSOLVE ONE TABLET UNDER THE TONGUE EVERY 5 MINUTES AS NEEDED FOR CHEST PAIN.  DO NOT EXCEED A TOTAL OF 3 DOSES IN 15 MINUTES   pantoprazole  (PROTONIX ) 20 MG tablet Take 1 tablet (20 mg total) by mouth daily.   tamsulosin  (FLOMAX ) 0.4 MG CAPS capsule Take 1 capsule (0.4 mg total) by mouth daily.   umeclidinium-vilanterol (ANORO ELLIPTA ) 62.5-25 MCG/ACT AEPB Inhale 1 puff by mouth once daily   varenicline  (CHANTIX ) 1 MG  tablet Take 1 tablet by mouth twice daily   Vitamin D , Cholecalciferol, 1000 UNITS CAPS Take 1 tablet by mouth daily.    Allergies:   Patient has no known allergies.   Social History   Socioeconomic History   Marital status: Married    Spouse name: Claudine   Number of children: 3   Years of education: some college   Highest education level: 12th grade  Occupational History   Occupation: Retired  Tobacco Use   Smoking status: Former    Current packs/day: 0.00    Average packs/day: 0.2 packs/day for 49.1 years (12.3 ttl pk-yrs)    Types: Cigarettes    Start date: 10/04/1973    Quit date: 11/02/2022    Years since quitting: 1.2   Smokeless tobacco: Never   Tobacco comments:    He is taking Chantix  since Fall 2023    He finally completely quit July 2024  Vaping Use   Vaping status: Never Used  Substance and Sexual Activity   Alcohol use: No    Alcohol/week: 0.0 standard drinks of alcohol   Drug use: No   Sexual activity: Not Currently    Partners: Female  Other Topics Concern   Not on file  Social History Narrative   Not on file   Social Drivers of Health   Financial Resource Strain: Low Risk  (10/24/2022)   Overall Financial Resource Strain (CARDIA)    Difficulty of Paying Living Expenses: Not hard at all  Food Insecurity: No Food Insecurity (10/24/2022)   Hunger Vital Sign    Worried About Running Out of Food in the Last Year: Never true    Ran Out of Food in the Last Year: Never true  Transportation Needs: No Transportation Needs (10/24/2022)   PRAPARE - Administrator, Civil Service (Medical): No    Lack of Transportation (Non-Medical): No  Physical Activity: Inactive (10/24/2022)   Exercise Vital Sign    Days of Exercise per Week: 0 days    Minutes of Exercise per Session: 0 min  Stress: No Stress Concern Present (10/24/2022)   [employer] of Occupational Health - Occupational Stress Questionnaire    Feeling of Stress : Not at all  Social  Connections: Moderately Isolated (10/24/2022)   Social Connection and Isolation Panel    Frequency of Communication with Friends and Family: Three times a week    Frequency of Social Gatherings with Friends and Family: Twice a week    Attends Religious Services: Never    Database Administrator or Organizations: No    Attends Engineer, Structural: Never    Marital Status: Married     Family History:  The patient's family history includes CVA in his mother; Cancer in his father; GER disease in his son; Gallbladder disease in his brother and brother; Healthy in his sister and sister; Heart Problems in his brother; Hypertension in  his brother and mother; Mental illness in his brother.  ROS:   12-point review of systems is negative unless otherwise noted in the HPI.  EKGs/Other Studies Reviewed:    Studies reviewed were summarized above. The additional studies were reviewed today:  06/2015 LHC 1. Mild to moderate nonobstructive coronary artery disease. The coronary arteries are overall moderately to heavily calcified especially the LAD. 2. Normal LV systolic function and high normal left ventricular end-diastolic pressure.  EKG:  EKG personally reviewed by me today EKG Interpretation Date/Time:  Tuesday February 05 2024 10:57:22 EDT Ventricular Rate:  48 PR Interval:  240 QRS Duration:  100 QT Interval:  440 QTC Calculation: 393 R Axis:   -4  Text Interpretation: Sinus bradycardia with sinus arrhythmia with 1st degree A-V block When compared with ECG of 05-Feb-2023 10:06, Premature supraventricular complexes are no longer Present Confirmed by Lorene Sinclair ([id number]) on 02/05/2024 11:03:35 AM  PHYSICAL EXAM:    VS:  BP (!) 140/72 (BP Location: Left Arm, Patient Position: Sitting, Cuff Size: Normal)   Pulse (!) 48 Comment: 62 oximeter  Ht 5' 11 (1.803 m)   Wt 167 lb 9.6 oz (76 kg)   SpO2 95%   BMI 23.38 kg/m   BMI: Body mass index is 23.38 kg/m.  GEN: Well nourished, well  developed in no acute distress NECK: No JVD; No carotid bruits CARDIAC: Bradycardic, regular rhythm, no murmurs, rubs, gallops RESPIRATORY:  Clear to auscultation without rales, wheezing or rhonchi  ABDOMEN: Soft, non-tender, non-distended EXTREMITIES: No edema; No deformity  Wt Readings from Last 3 Encounters:  02/05/24 167 lb 9.6 oz (76 kg)  08/03/23 168 lb 6.4 oz (76.4 kg)  05/04/23 164 lb 4.8 oz (74.5 kg)                  ASSESSMENT & PLAN:   Nonobstructive CAD - Cardiac catheterization 06/2015 with heavily calcified LAD and moderate calcifications of the RCA without definite disease.  No chest pain.  He is continued on aspirin  81 mg daily, atorvastatin  40 mg daily, and sublingual nitroglycerin  as needed.  Bradycardia First-degree AV block - Heart rate 48 bpm today.  Overall asymptomatic.  He has chronotropic competence with heart rate increasing to the 80s during ambulation.  Avoid AV nodal blocking agents.  No further testing indicated at this time.  Hypertension - BP mildly elevated today.  Would allow for some degree of permissive hypertension with low HR.  BP log provided. Could consider increasing amlodipine  if BP consistently > 140/80.   Hyperlipidemia - Most recent lipid panel 07/2023 with LDL 38, at goal.  He is continued on atorvastatin  as above.  Hx tobacco use - Was previously on Chantix .  Now with complete cessation.   Disposition: Monitor BP for 2 weeks. F/u with Dr. Darron or an APP in 1 year.   Medication Adjustments/Labs and Tests Ordered: Current medicines are reviewed at length with the patient today.  Concerns regarding medicines are outlined above. Medication changes, Labs and Tests ordered today are summarized above and listed in the Patient Instructions accessible in Encounters.   Bonney Sinclair Lorene, PA-C 02/05/2024 12:25 PM     [facility] [place] [address] [place], [place] [address] [phone]

## 2024-02-06 ENCOUNTER — Encounter: Payer: Self-pay | Admitting: Family Medicine

## 2024-02-06 ENCOUNTER — Ambulatory Visit: Admitting: Family Medicine

## 2024-02-06 VITALS — BP 134/70 | HR 73 | Resp 16 | Ht 71.0 in | Wt 168.0 lb

## 2024-02-06 DIAGNOSIS — E538 Deficiency of other specified B group vitamins: Secondary | ICD-10-CM

## 2024-02-06 DIAGNOSIS — G63 Polyneuropathy in diseases classified elsewhere: Secondary | ICD-10-CM

## 2024-02-06 DIAGNOSIS — I440 Atrioventricular block, first degree: Secondary | ICD-10-CM

## 2024-02-06 DIAGNOSIS — N1831 Chronic kidney disease, stage 3a: Secondary | ICD-10-CM

## 2024-02-06 DIAGNOSIS — I700 Atherosclerosis of aorta: Secondary | ICD-10-CM

## 2024-02-06 DIAGNOSIS — F1021 Alcohol dependence, in remission: Secondary | ICD-10-CM

## 2024-02-06 DIAGNOSIS — J479 Bronchiectasis, uncomplicated: Secondary | ICD-10-CM

## 2024-02-06 DIAGNOSIS — J432 Centrilobular emphysema: Secondary | ICD-10-CM | POA: Diagnosis not present

## 2024-02-06 DIAGNOSIS — I2510 Atherosclerotic heart disease of native coronary artery without angina pectoris: Secondary | ICD-10-CM

## 2024-02-06 DIAGNOSIS — Z8711 Personal history of peptic ulcer disease: Secondary | ICD-10-CM

## 2024-02-06 DIAGNOSIS — Z23 Encounter for immunization: Secondary | ICD-10-CM | POA: Diagnosis not present

## 2024-02-06 DIAGNOSIS — K219 Gastro-esophageal reflux disease without esophagitis: Secondary | ICD-10-CM

## 2024-02-06 DIAGNOSIS — I10 Essential (primary) hypertension: Secondary | ICD-10-CM

## 2024-02-06 MED ORDER — AMLODIPINE BESYLATE 5 MG PO TABS: 5.0000 mg | ORAL_TABLET | Freq: Every day | ORAL | 1 refills | Status: AC

## 2024-02-06 MED ORDER — VARENICLINE TARTRATE 0.5 MG PO TABS: 0.5000 mg | ORAL_TABLET | Freq: Two times a day (BID) | ORAL | 0 refills | Status: AC

## 2024-02-06 NOTE — Progress Notes (Signed)
 Name: Kevin Roberts   MRN: [id number]    DOB: 08/31/1945   Date:02/06/2024       Progress Note  Subjective  Chief Complaint  Chief Complaint  Patient presents with   Medical Management of Chronic Issues   Discussed the use of AI scribe software for clinical note transcription with the patient, who gave verbal consent to proceed.  History of Present Illness Kevin Roberts is a 78 year old male who presents for a routine follow-up visit.  He recently had an eye doctor appointment where he was diagnosed with a cataract in his right eye.  He has a history of coronary artery disease diagnosed in 2017 and is currently taking amlodipine , atorvastatin , and aspirin . He was recently diagnosed with a first-degree AV block. No chest tightness, jaw pain, or arm pain.  He has emphysema and uses Anoro daily. No wheezing, cough, or phlegm production, but he notes a dry throat in the mornings, possibly due to mouth breathing at night.  He uses pantoprazole  for heartburn, which he takes at night and has been doing well with it  He successfully quit smoking in July 2024 and has been using Chantix , currently at a reduced dose of 1 mg daily. He experiences cravings in the morning but manages them with lollipops in the evening.  He has a history of chronic kidney disease stage 3A with stable kidney function. His last kidney function was steady at 53.  He has a history of B12 deficiency and neuropathy, managed with gabapentin  300 mg twice daily and B12 supplementation. He reports no significant pain, only discomfort.  He has a history of benign prostatic hyperplasia managed with Flomax , with no current urinary symptoms.  He has a past history of alcoholism but has been abstinent for many years.    Patient Active Problem List   Diagnosis Date Noted   History of colon polyps 01/09/2023   Polyp of sigmoid colon 01/09/2023   Centrilobular emphysema (HCC) 01/26/2022   Bronchiectasis without complication  (HCC) 02/17/2020   History of gastric ulcer 02/17/2020   Hiatal hernia 01/22/2018   Abdominal pain, chronic, right lower quadrant 10/26/2017   Esophageal diverticulum 10/26/2017   History of alcoholism (HCC) 01/18/2016   Gynecomastia 10/14/2015   Abnormal nuclear stress test    Anginal equivalent    Atherosclerosis of aorta 04/15/2015   BPH with obstruction/lower urinary tract symptoms 02/03/2015   Renal cyst, left 10/13/2014   Tobacco use    B12 neuropathy 10/04/2014   CAD, multiple vessel 10/04/2014   Benign essential HTN 10/04/2014   Chronic kidney disease (CKD), stage III (moderate) (HCC) 10/04/2014   Dyslipidemia 10/04/2014   COPD, moderate (HCC) 10/04/2014   Gastro-esophageal reflux disease without esophagitis 10/04/2014   Enlarged prostate 10/04/2014   Vitamin D  deficiency 10/04/2014    Past Surgical History:  Procedure Laterality Date   CARDIAC CATHETERIZATION N/A 07/02/2015   Procedure: Left Heart Cath and Coronary Angiography;  Surgeon: Deatrice DELENA Cage, MD;  Location: [facility];  Service: Cardiovascular;  Laterality: N/A;   COLONOSCOPY  2011   JWB   COLONOSCOPY WITH PROPOFOL  N/A 11/26/2019   Procedure: COLONOSCOPY WITH PROPOFOL ;  Surgeon: Dessa Reyes ORN, MD;  Location: [facility];  Service: Endoscopy;  Laterality: N/A;   COLONOSCOPY WITH PROPOFOL  N/A 01/09/2023   Procedure: COLONOSCOPY WITH PROPOFOL ;  Surgeon: Jinny Carmine, MD;  Location: [facility];  Service: Endoscopy;  Laterality: N/A;   ESOPHAGOGASTRODUODENOSCOPY (EGD) WITH PROPOFOL  N/A 11/14/2017   Procedure: ESOPHAGOGASTRODUODENOSCOPY (  EGD) WITH PROPOFOL ;  Surgeon: Dessa Reyes ORN, MD;  Location: [facility];  Service: Endoscopy;  Laterality: N/A;   POLYPECTOMY  01/09/2023   Procedure: POLYPECTOMY;  Surgeon: Jinny Carmine, MD;  Location: [facility];  Service: Endoscopy;;   throat biopsy     lump removed    Family History  Problem Relation Age of Onset   Hypertension Mother    CVA  Mother    Cancer Father        Lung   Mental illness Brother        1-Schizophrenia   GER disease Son    Healthy Sister    Heart Problems Brother    Gallbladder disease Brother    Healthy Sister    Gallbladder disease Brother    Hypertension Brother     Social History   Tobacco Use   Smoking status: Former    Current packs/day: 0.00    Average packs/day: 0.2 packs/day for 49.1 years (12.3 ttl pk-yrs)    Types: Cigarettes    Start date: 10/04/1973    Quit date: 11/02/2022    Years since quitting: 1.2   Smokeless tobacco: Never   Tobacco comments:    He is taking Chantix  since Fall 2023    He finally completely quit July 2024  Substance Use Topics   Alcohol use: No    Alcohol/week: 0.0 standard drinks of alcohol     Current Outpatient Medications:    amLODipine  (NORVASC ) 5 MG tablet, Take 1 tablet by mouth once daily, Disp: 90 tablet, Rfl: 0   aspirin  81 MG tablet, Take 1 tablet by mouth daily., Disp: , Rfl:    atorvastatin  (LIPITOR) 40 MG tablet, Take 1 tablet (40 mg total) by mouth daily., Disp: 90 tablet, Rfl: 3   Cyanocobalamin  (B-12) 1000 MCG TBCR, Take 1 tablet by mouth daily., Disp: , Rfl:    gabapentin  (NEURONTIN ) 300 MG capsule, Take 1 capsule (300 mg total) by mouth 2 (two) times daily., Disp: 180 capsule, Rfl: 3   MULTIPLE VITAMIN PO, Take 1 tablet by mouth daily., Disp: , Rfl:    nitroGLYCERIN  (NITROSTAT ) 0.4 MG SL tablet, DISSOLVE ONE TABLET UNDER THE TONGUE EVERY 5 MINUTES AS NEEDED FOR CHEST PAIN.  DO NOT EXCEED A TOTAL OF 3 DOSES IN 15 MINUTES, Disp: 25 tablet, Rfl: 0   pantoprazole  (PROTONIX ) 20 MG tablet, Take 1 tablet (20 mg total) by mouth daily., Disp: 90 tablet, Rfl: 3   tamsulosin  (FLOMAX ) 0.4 MG CAPS capsule, Take 1 capsule (0.4 mg total) by mouth daily., Disp: 90 capsule, Rfl: 3   umeclidinium-vilanterol (ANORO ELLIPTA ) 62.5-25 MCG/ACT AEPB, Inhale 1 puff by mouth once daily, Disp: 180 each, Rfl: 3   varenicline  (CHANTIX ) 1 MG tablet, Take 1 tablet by  mouth twice daily, Disp: 180 tablet, Rfl: 0   Vitamin D , Cholecalciferol, 1000 UNITS CAPS, Take 1 tablet by mouth daily., Disp: , Rfl:   No Known Allergies  I personally reviewed active problem list, medication list, allergies, family history with the patient/caregiver today.   ROS  Ten systems reviewed and is negative except as mentioned in HPI    Objective Physical Exam  CONSTITUTIONAL: Patient appears well-developed and well-nourished.  No distress. HEENT: Head atraumatic, normocephalic, neck supple. CARDIOVASCULAR: Normal rate, regular rhythm and normal heart sounds.  No murmur heard. No BLE edema. PULMONARY: Effort normal and breath sounds normal. No respiratory distress. ABDOMINAL: There is no tenderness or distention. MUSCULOSKELETAL: Normal gait. Without gross motor or sensory deficit. PSYCHIATRIC: Patient  has a normal mood and affect. behavior is normal. Judgment and thought content normal.  Vitals:   02/06/24 0836  BP: 134/70  Pulse: 73  Resp: 16  SpO2: 98%  Weight: 168 lb (76.2 kg)  Height: 5' 11 (1.803 m)    Body mass index is 23.43 kg/m.   PHQ2/9:    08/03/2023    8:40 AM 05/04/2023    8:31 AM 01/31/2023    8:37 AM 10/24/2022    8:24 AM 08/01/2022    8:51 AM  Depression screen PHQ 2/9  Decreased Interest 0 0 0 0 0  Down, Depressed, Hopeless 0 0 0 0 0  PHQ - 2 Score 0 0 0 0 0  Altered sleeping 0 0 0 0 0  Tired, decreased energy 0 0 0 0 0  Change in appetite 0 0 0 0 0  Feeling bad or failure about yourself  0 0 0 0 0  Trouble concentrating 0 0 0 0 0  Moving slowly or fidgety/restless 0 0 0 0 0  Suicidal thoughts 0 0 0 0 0  PHQ-9 Score 0 0 0 0 0  Difficult doing work/chores Not difficult at all Not difficult at all       phq 9 is negative  Fall Risk:    08/03/2023    8:38 AM 05/04/2023    8:31 AM 01/31/2023    8:37 AM 10/24/2022    8:24 AM 08/01/2022    8:51 AM  Fall Risk   Falls in the past year? 0 0 0 0 0  Number falls in past yr: 0 0  0 0   Injury with Fall? 0 0  0 0  Risk for fall due to : No Fall Risks No Fall Risks No Fall Risks No Fall Risks No Fall Risks  Follow up Falls prevention discussed;Education provided;Falls evaluation completed Falls prevention discussed;Education provided;Falls evaluation completed Falls prevention discussed Falls prevention discussed Falls prevention discussed      Assessment & Plan Centrilobular emphysema/cylindrical bronchiectasy Improved symptoms post-smoking cessation. Dry throat likely due to mouth breathing or reflux. - Continue Anoro daily. - Use humidifier during winter. - Consider reflux as cause of dry throat, continue pantoprazole .  Coronary artery disease with multivessel coronary calcification Asymptomatic with controlled blood pressure and cholesterol. Beta blockers contraindicated due to first degree AV block. - Continue amlodipine  for blood pressure. - Continue atorvastatin  for cholesterol. - Continue aspirin  therapy. - Monitor for chest tightness, jaw pain, arm pain.  Essential hypertension Recent BP 134/70 mmHg. - Continue amlodipine . - Provide six-month supply of amlodipine .  First degree atrioventricular (AV) block Causes occasional slow heart rate. Considered benign.  Chronic kidney disease, stage 3a Kidney function at 72. Advised avoiding NSAIDs and maintaining hydration. - Avoid NSAIDs such as Aleve and Motrin. - Use Tylenol for pain. - Maintain adequate hydration.  B12 deficiency with neuropathy Neuropathy mild, controlled with B12 and gabapentin . - Continue gabapentin  300 mg twice daily. - Continue B12 supplementation.  Gastroesophageal reflux disease Managed with pantoprazole . Possible cause of morning dry throat. - Continue pantoprazole .  Benign prostatic hyperplasia with lower urinary tract symptoms No current urinary symptoms. Managed by urologist with Flomax .  Tobacco dependence, in remission In remission since July 2024. On Chantix ,  planning to taper. No smoking, occasional cravings. - Taper Chantix  to 0.5 mg with 180-day supply. - Encourage alternatives for cravings, e.g., peppermint, lollipops.  General Health Maintenance Discussed vaccinations and screenings. Recent CT showed cylindrical bronchiectasis and central lobular emphysema. Vaccinations up  to date. - Ensure vaccinations are up to date, including flu and pneumonia vaccines.

## 2024-02-12 ENCOUNTER — Other Ambulatory Visit: Payer: Self-pay

## 2024-02-12 DIAGNOSIS — N401 Enlarged prostate with lower urinary tract symptoms: Secondary | ICD-10-CM

## 2024-02-13 ENCOUNTER — Other Ambulatory Visit: Payer: Self-pay

## 2024-02-13 DIAGNOSIS — N401 Enlarged prostate with lower urinary tract symptoms: Secondary | ICD-10-CM

## 2024-02-13 NOTE — Progress Notes (Signed)
 02/15/2024 7:45 PM   Reyes N Huckeba 1946-03-18 [id number]  Referring provider: Sowles, Krichna, MD [address] [place],  [place] [address]  Urological history: 1. BPH with LU TS -PSA (01/2023) 2.5 -tamsulosin  0.4 mg daily  2. Family history of prostate cancer -youngest brother has been diagnosed with prostate cancer, he is in his late 11's  3. Renal cyst -RUS (2020) - left multiple cysts are noted measuring up to approximately 0.9 cm.  Chief Complaint  Patient presents with   Medical Management of Chronic Issues    HPI: Kevin Roberts is a 78 y.o. male who presents today for yearly follow up.  Previous records reviewed.   I PSS 14/2  He reports sensation of incomplete bladder emptying, urinary frequency, urinary intermittency, urinary urgency, a weak urinary stream,  having to strain to void, and nocturia x 2.    Patient denies any modifying or aggravating factors.  Patient denies any recent UTI's, gross hematuria, dysuria or suprapubic/flank pain.  Patient denies any fevers, chills, nausea or vomiting.    PSA  2.4  Serum creatinine (07/2023) 1.38, eGFR 53  BPH meds: Tamsulosin  0.4 mg daily   PMH: Past Medical History:  Diagnosis Date   Benign prostatic hypertrophy    Chronic kidney disease, stage III (moderate) (HCC)    COPD (chronic obstructive pulmonary disease) (HCC)    Cramps, muscle, general    Elevated ferritin    Emphysema of lung (HCC)    GERD (gastroesophageal reflux disease)    History of hypercalcemia    Hyperlipidemia    Hypertension    Incomplete bladder emptying    Nocturia    Nonobstructive CAD    a. 06/2015 MV: mid ant/antseptal/apical anterior ischemia; b. 06/2015 Cath: LM nl, LAD Ca2+, LCX min irregs, OM1/2/3 nl, RCA mod Ca2+, RPDA/RPAV/RPL1,2,3 nl. EF 55-65%.   Peripheral polyneuropathy    Rising PSA level    Tobacco use    Urgency of micturation    Vitamin B 12 deficiency     Surgical History: Past Surgical History:   Procedure Laterality Date   CARDIAC CATHETERIZATION N/A 07/02/2015   Procedure: Left Heart Cath and Coronary Angiography;  Surgeon: Deatrice DELENA Cage, MD;  Location: [facility];  Service: Cardiovascular;  Laterality: N/A;   COLONOSCOPY  2011   JWB   COLONOSCOPY WITH PROPOFOL  N/A 11/26/2019   Procedure: COLONOSCOPY WITH PROPOFOL ;  Surgeon: Dessa Reyes ORN, MD;  Location: [facility];  Service: Endoscopy;  Laterality: N/A;   COLONOSCOPY WITH PROPOFOL  N/A 01/09/2023   Procedure: COLONOSCOPY WITH PROPOFOL ;  Surgeon: Jinny Carmine, MD;  Location: [facility];  Service: Endoscopy;  Laterality: N/A;   ESOPHAGOGASTRODUODENOSCOPY (EGD) WITH PROPOFOL  N/A 11/14/2017   Procedure: ESOPHAGOGASTRODUODENOSCOPY (EGD) WITH PROPOFOL ;  Surgeon: Dessa Reyes ORN, MD;  Location: [facility];  Service: Endoscopy;  Laterality: N/A;   POLYPECTOMY  01/09/2023   Procedure: POLYPECTOMY;  Surgeon: Jinny Carmine, MD;  Location: [facility];  Service: Endoscopy;;   throat biopsy     lump removed    Home Medications:  Allergies as of 02/15/2024   No Known Allergies      Medication List        Accurate as of February 15, 2024 11:59 PM. If you have any questions, ask your nurse or doctor.          amLODipine  5 MG tablet Commonly known as: NORVASC  Take 1 tablet (5 mg total) by mouth daily.   Anoro Ellipta  62.5-25 MCG/ACT Aepb Generic  drug: umeclidinium-vilanterol Inhale 1 puff by mouth once daily   aspirin  81 MG tablet Take 1 tablet by mouth daily.   atorvastatin  40 MG tablet Commonly known as: LIPITOR Take 1 tablet (40 mg total) by mouth daily.   B-12 1000 MCG Tbcr Take 1 tablet by mouth daily.   gabapentin  300 MG capsule Commonly known as: NEURONTIN  Take 1 capsule (300 mg total) by mouth 2 (two) times daily.   MULTIPLE VITAMIN PO Take 1 tablet by mouth daily.   nitroGLYCERIN  0.4 MG SL tablet Commonly known as: NITROSTAT  DISSOLVE ONE TABLET UNDER THE TONGUE EVERY 5 MINUTES  AS NEEDED FOR CHEST PAIN.  DO NOT EXCEED A TOTAL OF 3 DOSES IN 15 MINUTES   pantoprazole  20 MG tablet Commonly known as: PROTONIX  Take 1 tablet (20 mg total) by mouth daily.   tamsulosin  0.4 MG Caps capsule Commonly known as: FLOMAX  Take 1 capsule (0.4 mg total) by mouth daily.   varenicline  0.5 MG tablet Commonly known as: Chantix  Take 1 tablet (0.5 mg total) by mouth 2 (two) times daily.   Vitamin D  (Cholecalciferol) 25 MCG (1000 UT) Caps Take 1 tablet by mouth daily.        Allergies: No Known Allergies  Family History: Family History  Problem Relation Age of Onset   Hypertension Mother    CVA Mother    Cancer Father        Lung   Mental illness Brother        1-Schizophrenia   GER disease Son    Healthy Sister    Heart Problems Brother    Gallbladder disease Brother    Healthy Sister    Gallbladder disease Brother    Hypertension Brother     Social History:  reports that he quit smoking about 15 months ago. His smoking use included cigarettes. He started smoking about 50 years ago. He has a 12.3 pack-year smoking history. He has never used smokeless tobacco. He reports that he does not drink alcohol and does not use drugs.  ROS: For pertinent review of systems please refer to history of present illness  Physical Exam: BP (!) 146/74   Pulse (!) 59   Ht 5' 11 (1.803 m)   Wt 168 lb (76.2 kg)   BMI 23.43 kg/m   Constitutional:  Well nourished. Alert and oriented, No acute distress. HEENT: [place] AT, moist mucus membranes.  Trachea midline Cardiovascular: No clubbing, cyanosis, or edema. Respiratory: Normal respiratory effort, no increased work of breathing. Neurologic: Grossly intact, no focal deficits, moving all 4 extremities. Psychiatric: Normal mood and affect.   Laboratory Data: See Epic and HPI   I have reviewed the labs.   Pertinent Imaging: N/A   Assessment and Plan  BPH with LU TS - moderate severe symptoms and he is mostly satisfied with  his urinary condition  - PSA up to date  - encouraged avoiding bladder irritants, fluid restriction before bedtime and timed voiding's - Continue tamsulosin  0.4 mg daily; refills given   2. Prostate cancer screening -Discussed the AUA Guideline's (07/2021) for men aged 70+ years with PSA < 3 ng/mL may choose discontinue screening  -he still desires to continue yearly screenings  Return in about 1 year (around 02/14/2025) for PSA, I PSS .  CLOTILDA HELON RIGGERS  [facility] [address] [place], [place] [address] [phone]

## 2024-02-14 LAB — PSA: Prostate Specific Ag, Serum: 2.4 ng/mL (ref 0.0–4.0)

## 2024-02-15 ENCOUNTER — Ambulatory Visit: Payer: Self-pay | Admitting: Urology

## 2024-02-15 ENCOUNTER — Ambulatory Visit (INDEPENDENT_AMBULATORY_CARE_PROVIDER_SITE_OTHER): Admitting: Urology

## 2024-02-15 VITALS — BP 146/74 | HR 59 | Ht 71.0 in | Wt 168.0 lb

## 2024-02-15 DIAGNOSIS — R350 Frequency of micturition: Secondary | ICD-10-CM

## 2024-02-15 DIAGNOSIS — N401 Enlarged prostate with lower urinary tract symptoms: Secondary | ICD-10-CM | POA: Diagnosis not present

## 2024-02-15 DIAGNOSIS — N138 Other obstructive and reflux uropathy: Secondary | ICD-10-CM | POA: Diagnosis not present

## 2024-02-15 DIAGNOSIS — Z125 Encounter for screening for malignant neoplasm of prostate: Secondary | ICD-10-CM | POA: Diagnosis not present

## 2024-02-15 MED ORDER — TAMSULOSIN HCL 0.4 MG PO CAPS
0.4000 mg | ORAL_CAPSULE | Freq: Every day | ORAL | 3 refills | Status: AC
Start: 2024-02-15 — End: ?

## 2024-02-17 ENCOUNTER — Encounter: Payer: Self-pay | Admitting: Urology

## 2024-05-23 ENCOUNTER — Ambulatory Visit

## 2024-08-08 ENCOUNTER — Ambulatory Visit: Admitting: Family Medicine

## 2025-02-05 ENCOUNTER — Other Ambulatory Visit

## 2025-02-12 ENCOUNTER — Ambulatory Visit: Admitting: Urology
